# Patient Record
Sex: Female | Born: 1955 | Race: Black or African American | Hispanic: No | Marital: Single | Smoking: Former smoker
Health system: Southern US, Community
[De-identification: ages and names within clinical notes are randomized; demographics above are authoritative.]

## PROBLEM LIST (undated history)

## (undated) DIAGNOSIS — M5136 Other intervertebral disc degeneration, lumbar region: Secondary | ICD-10-CM

## (undated) DIAGNOSIS — L700 Acne vulgaris: Secondary | ICD-10-CM

## (undated) DIAGNOSIS — M109 Gout, unspecified: Secondary | ICD-10-CM

## (undated) DIAGNOSIS — G4730 Sleep apnea, unspecified: Secondary | ICD-10-CM

## (undated) DIAGNOSIS — E119 Type 2 diabetes mellitus without complications: Secondary | ICD-10-CM

## (undated) DIAGNOSIS — R42 Dizziness and giddiness: Secondary | ICD-10-CM

## (undated) DIAGNOSIS — M51369 Other intervertebral disc degeneration, lumbar region without mention of lumbar back pain or lower extremity pain: Secondary | ICD-10-CM

## (undated) DIAGNOSIS — M1990 Unspecified osteoarthritis, unspecified site: Secondary | ICD-10-CM

## (undated) DIAGNOSIS — H9190 Unspecified hearing loss, unspecified ear: Secondary | ICD-10-CM

## (undated) DIAGNOSIS — K635 Polyp of colon: Secondary | ICD-10-CM

## (undated) DIAGNOSIS — F09 Unspecified mental disorder due to known physiological condition: Secondary | ICD-10-CM

## (undated) DIAGNOSIS — F329 Major depressive disorder, single episode, unspecified: Secondary | ICD-10-CM

## (undated) DIAGNOSIS — K219 Gastro-esophageal reflux disease without esophagitis: Secondary | ICD-10-CM

## (undated) DIAGNOSIS — D649 Anemia, unspecified: Secondary | ICD-10-CM

## (undated) DIAGNOSIS — I10 Essential (primary) hypertension: Secondary | ICD-10-CM

## (undated) DIAGNOSIS — E785 Hyperlipidemia, unspecified: Secondary | ICD-10-CM

## (undated) DIAGNOSIS — L659 Nonscarring hair loss, unspecified: Secondary | ICD-10-CM

## (undated) DIAGNOSIS — F32A Depression, unspecified: Secondary | ICD-10-CM

## (undated) DIAGNOSIS — K317 Polyp of stomach and duodenum: Secondary | ICD-10-CM

## (undated) DIAGNOSIS — Q440 Agenesis, aplasia and hypoplasia of gallbladder: Secondary | ICD-10-CM

## (undated) DIAGNOSIS — M3500 Sicca syndrome, unspecified: Secondary | ICD-10-CM

## (undated) DIAGNOSIS — F419 Anxiety disorder, unspecified: Secondary | ICD-10-CM

## (undated) DIAGNOSIS — M5416 Radiculopathy, lumbar region: Secondary | ICD-10-CM

## (undated) DIAGNOSIS — H409 Unspecified glaucoma: Secondary | ICD-10-CM

## (undated) DIAGNOSIS — R519 Headache, unspecified: Secondary | ICD-10-CM

## (undated) HISTORY — DX: Gout, unspecified: M10.9

## (undated) HISTORY — DX: Polyp of colon: K63.5

## (undated) HISTORY — DX: Unspecified glaucoma: H40.9

## (undated) HISTORY — PX: TUBAL LIGATION: SHX77

## (undated) HISTORY — DX: Other intervertebral disc degeneration, lumbar region: M51.36

## (undated) HISTORY — PX: CHOLECYSTECTOMY: SHX55

## (undated) HISTORY — DX: Acne vulgaris: L70.0

## (undated) HISTORY — DX: Type 2 diabetes mellitus without complications: E11.9

## (undated) HISTORY — DX: Depression, unspecified: F32.A

## (undated) HISTORY — DX: Agenesis, aplasia and hypoplasia of gallbladder: Q44.0

## (undated) HISTORY — DX: Radiculopathy, lumbar region: M54.16

## (undated) HISTORY — DX: Other intervertebral disc degeneration, lumbar region without mention of lumbar back pain or lower extremity pain: M51.369

## (undated) HISTORY — DX: Anxiety disorder, unspecified: F41.9

## (undated) HISTORY — DX: Sjogren syndrome, unspecified: M35.00

## (undated) HISTORY — DX: Major depressive disorder, single episode, unspecified: F32.9

## (undated) HISTORY — DX: Sleep apnea, unspecified: G47.30

## (undated) HISTORY — DX: Unspecified osteoarthritis, unspecified site: M19.90

## (undated) HISTORY — DX: Gastro-esophageal reflux disease without esophagitis: K21.9

## (undated) HISTORY — DX: Anemia, unspecified: D64.9

## (undated) HISTORY — PX: BACK SURGERY: SHX140

---

## 2013-05-22 HISTORY — PX: COLONOSCOPY: SHX174

## 2017-01-27 ENCOUNTER — Emergency Department (HOSPITAL_COMMUNITY)
Admission: EM | Admit: 2017-01-27 | Discharge: 2017-01-27 | Disposition: A | Discharge status: Home / Self Care | Payer: Federal, State, Local not specified - PPO | Attending: Emergency Medicine | Admitting: Emergency Medicine

## 2017-01-27 ENCOUNTER — Encounter (HOSPITAL_COMMUNITY): Payer: Self-pay

## 2017-01-27 ENCOUNTER — Emergency Department (HOSPITAL_COMMUNITY): Payer: Federal, State, Local not specified - PPO

## 2017-01-27 DIAGNOSIS — Z87891 Personal history of nicotine dependence: Secondary | ICD-10-CM | POA: Diagnosis not present

## 2017-01-27 DIAGNOSIS — Z7984 Long term (current) use of oral hypoglycemic drugs: Secondary | ICD-10-CM | POA: Diagnosis not present

## 2017-01-27 DIAGNOSIS — R42 Dizziness and giddiness: Secondary | ICD-10-CM | POA: Diagnosis not present

## 2017-01-27 DIAGNOSIS — Z79899 Other long term (current) drug therapy: Secondary | ICD-10-CM | POA: Insufficient documentation

## 2017-01-27 DIAGNOSIS — G930 Cerebral cysts: Secondary | ICD-10-CM | POA: Diagnosis not present

## 2017-01-27 HISTORY — DX: Dizziness and giddiness: R42

## 2017-01-27 LAB — CBC WITH DIFFERENTIAL/PLATELET
Basophils Absolute: 0 10*3/uL (ref 0.0–0.1)
Basophils Relative: 1 %
Eosinophils Absolute: 0.2 10*3/uL (ref 0.0–0.7)
Eosinophils Relative: 4 %
HCT: 38.7 % (ref 36.0–46.0)
Hemoglobin: 12.5 g/dL (ref 12.0–15.0)
Lymphocytes Relative: 47 %
Lymphs Abs: 2.9 10*3/uL (ref 0.7–4.0)
MCH: 29.8 pg (ref 26.0–34.0)
MCHC: 32.3 g/dL (ref 30.0–36.0)
MCV: 92.1 fL (ref 78.0–100.0)
Monocytes Absolute: 0.5 10*3/uL (ref 0.1–1.0)
Monocytes Relative: 8 %
Neutro Abs: 2.5 10*3/uL (ref 1.7–7.7)
Neutrophils Relative %: 40 %
Platelets: 361 10*3/uL (ref 150–400)
RBC: 4.2 MIL/uL (ref 3.87–5.11)
RDW: 14.1 % (ref 11.5–15.5)
WBC: 6.1 10*3/uL (ref 4.0–10.5)

## 2017-01-27 LAB — COMPREHENSIVE METABOLIC PANEL
ALT: 22 U/L (ref 14–54)
AST: 21 U/L (ref 15–41)
Albumin: 3.9 g/dL (ref 3.5–5.0)
Alkaline Phosphatase: 65 U/L (ref 38–126)
Anion gap: 7 (ref 5–15)
BUN: 12 mg/dL (ref 6–20)
CO2: 27 mmol/L (ref 22–32)
Calcium: 9.1 mg/dL (ref 8.9–10.3)
Chloride: 104 mmol/L (ref 101–111)
Creatinine, Ser: 0.73 mg/dL (ref 0.44–1.00)
GFR calc Af Amer: >60 mL/min (ref >60)
GFR calc non Af Amer: >60 mL/min (ref >60)
Glucose, Bld: 104 mg/dL — ABNORMAL HIGH (ref 65–99)
Potassium: 4.3 mmol/L (ref 3.5–5.1)
Sodium: 138 mmol/L (ref 135–145)
Total Bilirubin: 0.7 mg/dL (ref 0.3–1.2)
Total Protein: 7.8 g/dL (ref 6.5–8.1)

## 2017-01-27 LAB — I-STAT TROPONIN, ED: Troponin i, poc: 0 ng/mL (ref 0.00–0.08)

## 2017-01-27 MED ORDER — DIPHENHYDRAMINE HCL 50 MG/ML IJ SOLN
25.0000 mg | Freq: Once | INTRAMUSCULAR | Status: AC
  Administered 2017-01-27: 25 mg via INTRAVENOUS
  Filled 2017-01-27: qty 1

## 2017-01-27 MED ORDER — METOCLOPRAMIDE HCL 5 MG/ML IJ SOLN
10.0000 mg | Freq: Once | INTRAMUSCULAR | Status: AC
  Administered 2017-01-27: 10 mg via INTRAVENOUS
  Filled 2017-01-27: qty 2

## 2017-01-27 MED ORDER — MECLIZINE HCL 25 MG PO TABS
25.0000 mg | ORAL_TABLET | Freq: Once | ORAL | Status: AC
  Administered 2017-01-27: 25 mg via ORAL
  Filled 2017-01-27: qty 1

## 2017-01-27 MED ORDER — LORAZEPAM 2 MG/ML IJ SOLN
1.0000 mg | Freq: Once | INTRAMUSCULAR | Status: AC
  Administered 2017-01-27: 1 mg via INTRAVENOUS
  Filled 2017-01-27: qty 1

## 2017-01-27 MED ORDER — GADOBENATE DIMEGLUMINE 529 MG/ML IV SOLN
20.0000 mL | Freq: Once | INTRAVENOUS | Status: AC
  Administered 2017-01-27: 20 mL via INTRAVENOUS

## 2017-01-27 MED ORDER — SODIUM CHLORIDE 0.9 % IV BOLUS (SEPSIS)
1000.0000 mL | Freq: Once | INTRAVENOUS | Status: AC
  Administered 2017-01-27: 1000 mL via INTRAVENOUS

## 2017-01-27 NOTE — ED Provider Notes (Signed)
[facility]-EMERGENCY DEPT Provider Note   CSN: [id number] Arrival date & time: 01/27/17  4098     History   Chief Complaint Chief Complaint  Patient presents with  . Dizziness    HPI Rebecca Hahn is a 61 y.o. female history of labyrinthitis, known peripheral vertigo, here presenting with worsening vertigo. Patient states that she just moved here from [place] to work. She has previous history of vertigo and has seen ENT doctor before and is on meclizine and Klonopin at baseline. Patient states that she has been driving very often and had worsening dizziness since yesterday. She took meclizine 3 times yesterday with minimal relief. She just constantly feels that the room was spinning. Denies falling over or neck pain or weakness or numbness. Patient states that she has no history of strokes or mini strokes in the past.   The history is provided by the patient.    Past Medical History:  Diagnosis Date  . Vertigo     There are no active problems to display for this patient.   History reviewed. No pertinent surgical history.  OB History    No data available       Home Medications    Prior to Admission medications   Medication Sig Start Date End Date Taking? Authorizing Provider  allopurinol (ZYLOPRIM) 100 MG tablet Take 100 mg by mouth 2 (two) times daily. 12/31/16  Yes [provider]  clonazePAM (KLONOPIN) 0.5 MG tablet Take 0.5 mg by mouth 2 (two) times daily as needed for anxiety. 01/12/17  Yes [provider]  Diclofenac Sodium CR 100 MG 24 hr tablet Take 100 mg by mouth daily. 12/20/16  Yes [provider]  gabapentin (NEURONTIN) 100 MG capsule Take 100 mg by mouth 3 (three) times daily as needed. 01/01/17  Yes [provider]  metFORMIN (GLUCOPHAGE) 1000 MG tablet Take 1,000 mg by mouth See admin instructions. Take  1 to 2 times daily   Yes [provider]  pilocarpine (SALAGEN) 5 MG tablet Take 5 mg by mouth daily. 12/01/16  Yes  [provider]  sertraline (ZOLOFT) 100 MG tablet Take 100 mg by mouth daily. 12/31/16  Yes [provider]  traZODone (DESYREL) 50 MG tablet Take 50-100 mg by mouth at bedtime.   Yes [provider]  hydroxychloroquine (PLAQUENIL) 200 MG tablet Take 200 mg by mouth daily. 01/01/17   [provider]    Family History History reviewed. No pertinent family history.  Social History Social History  Substance Use Topics  . Smoking status: Former Games developer  . Smokeless tobacco: Never Used  . Alcohol use No     Allergies   Penicillins   Review of Systems Review of Systems  Neurological: Positive for dizziness.  All other systems reviewed and are negative.    Physical Exam Updated Vital Signs BP (!) 157/82 (BP Location: Right Arm)   Pulse 78   Temp 97.8 F (36.6 C) (Oral)   Resp 18   Ht  (1.702 m)   Wt 113.4 kg (250 lb)   SpO2 98%   BMI 39.16 kg/m   Physical Exam  Constitutional: She is oriented to person, place, and time.  Uncomfortable, eyes closed   HENT:  Head: Normocephalic.  Mouth/Throat: Oropharynx is clear and moist.  Eyes:  Mild L horizontal nystagmus, not changing with direction   Neck: Normal range of motion. Neck supple.  Cardiovascular: Normal rate, regular rhythm and normal heart sounds.   Pulmonary/Chest: Effort normal and  breath sounds normal. No respiratory distress. She has no wheezes.  Abdominal: Soft. Bowel sounds are normal. She exhibits no distension. There is no tenderness.  Musculoskeletal: Normal range of motion.  Neurological: She is alert and oriented to person, place, and time. No cranial nerve deficit. Coordination normal.  CN 2-12 intact, nl finger to nose. Nl strength throughout. Nl gait   Skin: Skin is warm.  Psychiatric: She has a normal mood and affect.  Nursing note and vitals reviewed.    ED Treatments / Results  Labs (all labs ordered are listed, but only abnormal results are  displayed) Labs Reviewed  COMPREHENSIVE METABOLIC PANEL - Abnormal; Notable for the following:       Result Value   Glucose, Bld 104 (*)    All other components within normal limits  CBC WITH DIFFERENTIAL/PLATELET  I-STAT TROPONIN, ED    EKG  EKG Interpretation  Date/Time:  Saturday January 27 2017 08:45:56 EDT Ventricular Rate:  79 PR Interval:    QRS Duration: 72 QT Interval:  388 QTC Calculation: 445 R Axis:   35 Text Interpretation:  Sinus rhythm Borderline T abnormalities, anterior leads Baseline wander No old tracing to compare Confirmed by Pricilla LovelessGoldston, Scott [phone]([id number]) on 01/27/2017 8:50:46 AM       Radiology Ct Head Wo Contrast  Result Date: 01/27/2017 CLINICAL DATA:  Dizziness, weakness. EXAM: CT HEAD WITHOUT CONTRAST TECHNIQUE: Contiguous axial images were obtained from the base of the skull through the vertex without intravenous contrast. COMPARISON:  None. FINDINGS: Brain: Left temporal subcortical white matter low density is noted concerning for infection, infarction or edema. No midline shift is noted. No hemorrhage is noted. Vascular: No hyperdense vessel or unexpected calcification. Skull: Normal. Negative for fracture or focal lesion. Sinuses/Orbits: No acute finding. Other: None. IMPRESSION: Left temporal subcortical white matter low density is noted concerning for inflammation, infarction or edema. MRI is recommended for further evaluation. Electronically Signed   By: Lupita RaiderJames  Green Jr, M.D.   On: 01/27/2017 09:55    Procedures Procedures (including critical care time)  Medications Ordered in ED Medications  sodium chloride 0.9 % bolus 1,000 mL (1,000 mLs Intravenous New Bag/Given 01/27/17 0917)  meclizine (ANTIVERT) tablet 25 mg (25 mg Oral Given 01/27/17 0927)  metoCLOPramide (REGLAN) injection 10 mg (10 mg Intravenous Given 01/27/17 0927)  diphenhydrAMINE (BENADRYL) injection 25 mg (25 mg Intravenous Given 01/27/17 0928)  LORazepam (ATIVAN) injection 1 mg (1 mg  Intravenous Given 01/27/17 [id number])     Initial Impression / Assessment and Plan / ED Course  I have reviewed the triage vital signs and the nursing notes.  Pertinent labs & imaging results that were available during my care of the patient were reviewed by me and considered in my medical decision making (see chart for details).    Bronson IngYvette Lalani is a 61 y.o. female here with dizziness, vertigo. Hx of labyrinthitis and peripheral vertigo and this is a recurrent issue. No hx of posterior stroke. Will get labs, give meclizine, ativan and reassess.    10:40 AM Patient's labs unremarkable. Orthostatics unremarkable. CT head showed possible L temporal lobe inflammation vs edema. I called Dr. Laurence SlateAroor from neurology. He recommend MRI brain w/wo to r/o stroke vs MS vs cyst. There is no MRI at [facility] this weekend. [facility]. If MRI showed stroke then will need official consult with neurology. Otherwise, patient has meclizine and klonopin at home and per neurology, patient can be discharged if MRI unremarkable.  Dr. Juleen China accepting in the ED.   Final Clinical Impressions(s) / ED Diagnoses   Final diagnoses:  None    New Prescriptions New Prescriptions   No medications on file     Charlynne Pander, MD 01/27/17 1042

## 2017-01-27 NOTE — ED Triage Notes (Signed)
Pt with dizziness and weakness since yesterday.  Feels "heavy".  No fever.  No pain.  No chest pain

## 2017-01-27 NOTE — Discharge Instructions (Signed)
Return here as needed.  Follow-up with the neurologist provided °

## 2017-01-27 NOTE — ED Notes (Signed)
Pt st's dizziness has subsided at this time.  Pt waiting for MRI

## 2017-01-27 NOTE — ED Provider Notes (Signed)
12:48 PM atient seen and examined. Patient is a transfer from [facility] emergency department. Patient was seen for vertigo-like symptoms. Had CT scan that showedleft temporal subcortical white matter low density, concerning for inflammation, infarction, or edema. MRI was recommended next. Patient's was transferred here for MRI.  Patient reports history of vertigo-like symptoms since 1997. She states she is from [place] and has had to have multiple ER visits and admissions for her vertigo. She is currently taking meclizine which has not helped this time. She states that she normally gets vertigo whenever she has a sinus infection.She reports current nasal drainage, but states it is clear. Reports some pressure over her sinuses.  Patient received meclizine, Reglan, Benadryl, Ativan [facility] as well as fluids. She is feeling much better. Her labs were normal. We'll wait for MRI. We'll monitor.  Vitals:   01/27/17 0829 01/27/17 0833 01/27/17 1157 01/27/17 1237  BP:  (!) 157/82 123/82 (!) 120/50  Pulse:  78 77 79  Resp:  18 (!) 22 18  Temp:  97.8 F (36.6 C) 97.9 F (36.6 C) 98.8 F (37.1 C)  TempSrc:  Oral Oral Oral  SpO2:  98% 97% 98%  Weight: 113.4 kg (250 lb)     Height: 5\' 7"  (1.702 m)         Jaynie CrumbleKirichenko, Maxxon Schwanke, PA-C 01/28/17 [id number]    Vanetta MuldersZackowski, Scott, MD 01/31/17 2218

## 2017-01-27 NOTE — ED Notes (Signed)
Patient is resting with family at bedside with call bell in reach 

## 2017-01-30 DIAGNOSIS — R42 Dizziness and giddiness: Secondary | ICD-10-CM | POA: Diagnosis not present

## 2017-01-30 DIAGNOSIS — M150 Primary generalized (osteo)arthritis: Secondary | ICD-10-CM | POA: Diagnosis not present

## 2017-01-30 DIAGNOSIS — E1165 Type 2 diabetes mellitus with hyperglycemia: Secondary | ICD-10-CM | POA: Diagnosis not present

## 2017-01-30 DIAGNOSIS — M3501 Sicca syndrome with keratoconjunctivitis: Secondary | ICD-10-CM | POA: Diagnosis not present

## 2017-01-31 DIAGNOSIS — M79643 Pain in unspecified hand: Secondary | ICD-10-CM | POA: Diagnosis not present

## 2017-01-31 DIAGNOSIS — M79641 Pain in right hand: Secondary | ICD-10-CM | POA: Diagnosis not present

## 2017-01-31 DIAGNOSIS — M79642 Pain in left hand: Secondary | ICD-10-CM | POA: Diagnosis not present

## 2017-01-31 DIAGNOSIS — M180 Bilateral primary osteoarthritis of first carpometacarpal joints: Secondary | ICD-10-CM | POA: Diagnosis not present

## 2017-01-31 DIAGNOSIS — M1990 Unspecified osteoarthritis, unspecified site: Secondary | ICD-10-CM | POA: Diagnosis not present

## 2017-01-31 DIAGNOSIS — M3500 Sicca syndrome, unspecified: Secondary | ICD-10-CM | POA: Diagnosis not present

## 2017-01-31 DIAGNOSIS — M549 Dorsalgia, unspecified: Secondary | ICD-10-CM | POA: Diagnosis not present

## 2017-02-08 ENCOUNTER — Encounter: Payer: Self-pay | Admitting: Neurology

## 2017-02-08 ENCOUNTER — Ambulatory Visit (INDEPENDENT_AMBULATORY_CARE_PROVIDER_SITE_OTHER): Payer: Federal, State, Local not specified - PPO | Admitting: Neurology

## 2017-02-08 VITALS — BP 144/93 | HR 89 | Ht 67.0 in | Wt 262.0 lb

## 2017-02-08 DIAGNOSIS — Z6841 Body Mass Index (BMI) 40.0 and over, adult: Secondary | ICD-10-CM

## 2017-02-08 DIAGNOSIS — R42 Dizziness and giddiness: Secondary | ICD-10-CM | POA: Diagnosis not present

## 2017-02-08 DIAGNOSIS — G930 Cerebral cysts: Secondary | ICD-10-CM | POA: Diagnosis not present

## 2017-02-08 DIAGNOSIS — G4733 Obstructive sleep apnea (adult) (pediatric): Secondary | ICD-10-CM | POA: Diagnosis not present

## 2017-02-08 DIAGNOSIS — R519 Headache, unspecified: Secondary | ICD-10-CM

## 2017-02-08 DIAGNOSIS — R51 Headache: Secondary | ICD-10-CM | POA: Diagnosis not present

## 2017-02-08 NOTE — Patient Instructions (Signed)
Your neurological exam is good today, please try to stay well hydrated with water.  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  Please talk to your primary care about seeing an ENT.  We will do another sleep study to recheck on your sleep apnea.

## 2017-02-08 NOTE — Progress Notes (Signed)
Subjective:    Patient ID: Nai Borromeo is a 61 y.o. female.  HPI     Huston Foley, MD, PhD [facility] [address], [address] [address] [place], [place] [address]  I saw patient Mikiya Nebergall as a referral from the emergency room for dizziness. The patient is accompanied by her youngest daughter today. She is a 39 year old right-handed woman with an underlying medical history of recurrent headaches, Sjogren's syndrome, gout, arthritis, chronic low back pain, and morbid obesity with a BMI of over 40, who has a prior history of dizziness and vertigo, for which she was followed by a ENT in [place] before she moved here. She presented to the emergency room on 01/27/2017 with complaint of dizziness which lasted since the day prior. She described a room spinning sensation. She denied any other focal neurologic complaints. She was treated in the past with meclizine and clonazepam. In the emergency room she was treated symptomatically with IV fluids, Benadryl, Reglan, Ativan and meclizine.   She had a brain MRI with and without contrast on 01/27/2017 which I reviewed: IMPRESSION: 1. 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 Additional considerations include a neuroglial or other benign cyst or less likely cystic neoplasm. Comparison with any prior outside studies may be helpful to establish stability. Recommend follow-up contrast-enhanced brain MRI in 6 months with thin section T1 and T2 (CISS/FIESTA) imaging through the temporal lobes. 2. 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 3. No acute infarct.  Prior to that she had a head CT without contrast on 01/27/2017 which I reviewed:    IMPRESSION: Left temporal subcortical white matter low density is noted concerning for inflammation, infarction or edema. MRI is recommended for further evaluation. She is not sure, that meclizine is helpful. She walks more cautiously, she fell in the bathroom, but tripped over a chair at night, on the way to bathroom.  She moved from [place] a month ago, she has 4 daughters, lives with the youngest. She works at the [employer] in [place], in med records. She had a sleep study in May 2015 which indicated mild sleep apnea, no significant PLMS. She had moderate sleep apnea during REM sleep. Epworth Sleepiness Scale score is 18 out of 24, fatigue score is 57 out of 63. She is followed by rheumatology for her Sjogren's. She had some medication changes recently. She has woken up with a headache. She denies any one-sided weakness or numbness or tingling or droopy face or slurring of speech but sometimes as the day goes on her speech is more slow. She reports that she never had an MRI before.  Her Past Medical History Is Significant For: Past Medical History:  Diagnosis Date  . Vertigo     Her Past Surgical History Is Significant For: No past surgical history on file.  Her Family History Is Significant For: No family history on file.  Her Social History Is Significant For: Social History   Social History  . Marital status: Single    Spouse name: N/A  . Number of children: N/A  . Years of education: N/A   Social History Main Topics  . Smoking status: Former Games developer  . Smokeless tobacco: Never Used  . Alcohol use No  . Drug use: No  . Sexual  activity: Not Asked   Other Topics Concern  . None   Social History Narrative  . None    Her Allergies Are:  Allergies  Allergen Reactions  . Penicillins Rash    Has patient had a PCN reaction causing immediate rash, facial/tongue/throat swelling, SOB or lightheadedness with hypotension: No Has patient had a PCN reaction causing severe  rash involving mucus membranes or skin necrosis: No Has patient had a PCN reaction that required hospitalization: No Has patient had a PCN reaction occurring within the last 10 years: No If all of the above answers are "NO", then may proceed with Cephalosporin use.    :   Her Current Medications Are:  Outpatient Encounter Prescriptions as of 02/08/2017  Medication Sig  . allopurinol (ZYLOPRIM) 100 MG tablet Take 200 mg by mouth daily.   Marland Kitchen aspirin 81 MG chewable tablet Chew 81 mg by mouth daily.  . B Complex-C (SUPER B COMPLEX PO) Take by mouth.  . calcium-vitamin D (OSCAL WITH D) 250-125 MG-UNIT tablet Take 1 tablet by mouth daily.  . Cholecalciferol (VITAMIN D-3) 5000 units TABS Take by mouth.  . clonazePAM (KLONOPIN) 0.5 MG tablet Take 0.5 mg by mouth at bedtime.   . Cyanocobalamin (VITAMIN B-12 SL) Place under the tongue.  . cycloSPORINE (RESTASIS) 0.05 % ophthalmic emulsion 1 drop 2 (two) times daily.  . Diclofenac Sodium CR 100 MG 24 hr tablet Take 100 mg by mouth daily.  Marland Kitchen gabapentin (NEURONTIN) 100 MG capsule Take 100 mg by mouth 3 (three) times daily as needed.  . hydroxychloroquine (PLAQUENIL) 200 MG tablet Take 200 mg by mouth daily.  . IRON PO Take by mouth.  . meclizine (ANTIVERT) 12.5 MG tablet Take 12.5 mg by mouth 3 (three) times daily as needed for dizziness.  . metFORMIN (GLUCOPHAGE) 500 MG tablet Take 500 mg by mouth daily with breakfast.  . pilocarpine (SALAGEN) 5 MG tablet Take 5 mg by mouth 3 (three) times daily as needed.   . rosuvastatin (CRESTOR) 20 MG tablet Take 20 mg by mouth daily.  . sertraline (ZOLOFT) 50 MG tablet Take 75 mg by mouth daily.  . traZODone (DESYREL) 50 MG tablet Take 50-100 mg by mouth at bedtime.  Marland Kitchen VITAMIN B1-B12 PO Take by mouth.  . [DISCONTINUED] metFORMIN (GLUCOPHAGE) 1000 MG tablet Take 1,000 mg by mouth See admin instructions. Take  1 to 2 times daily  . [DISCONTINUED] sertraline (ZOLOFT) 100 MG tablet Take 100 mg by mouth daily.    No facility-administered encounter medications on file as of 02/08/2017.   :  Review of Systems:  Out of a complete 14 point review of systems, all are reviewed and negative with the exception of these symptoms as listed below: Review of Systems  Neurological:       Pt presents today to discuss her dizziness and brain cyst. Pt does not have a neurosurgeon. Pt is complaining of dizziness for which meclizine has not helped.    Objective:  Neurological Exam  Physical Exam Physical Examination:   Vitals:   02/08/17 1412  BP: (!) 144/93  Pulse: 89   General Examination: The patient is a very pleasant 61 y.o. female in no acute distress. She appears well-developed and well-nourished and well groomed. She currently does not have any vertiginous symptoms. Orthostatic vital signs were benign.  HEENT: Normocephalic, atraumatic, pupils are equal, round and reactive to light and accommodation. Extraocular tracking is good without limitation to gaze excursion or nystagmus noted. Normal smooth pursuit is  noted. Hearing is grossly intact. Face is symmetric with normal facial animation and normal facial sensation. Speech is clear with no dysarthria noted. There is no hypophonia. There is no lip, neck/head, jaw or voice tremor. Neck is supple with full range of passive and active motion. There are no carotid bruits on auscultation. Oropharynx exam reveals: moderate mouth dryness, adequate dental hygiene and moderate airway crowding secondary to smaller airway, tonsils are 1-2+ and longer uvula. Mallampati is class II. Tongue protrudes centrally and palate elevates symmetrically.  neck circumference is 16-1/2 inches.   Chest: Clear to auscultation without wheezing, rhonchi or crackles noted.  Heart: S1+S2+0, regular and normal without murmurs, rubs or gallops noted.   Abdomen: Soft, non-tender and non-distended with normal bowel sounds appreciated on auscultation.  Extremities: There is no pitting  edema in the distal lower extremities bilaterally. Pedal pulses are intact.  Skin: Warm and dry without trophic changes noted. There are no varicose veins.  Musculoskeletal: exam reveals no obvious joint deformities, tenderness or joint swelling or erythema.   Neurologically:  Mental status: The patient is awake, alert and oriented in all 4 spheres. Her immediate and remote memory, attention, language skills and fund of knowledge are appropriate. There is no evidence of aphasia, agnosia, apraxia or anomia. Speech is clear with normal prosody and enunciation. Thought process is linear. Mood is normal and affect is normal.  Cranial nerves II - XII are as described above under HEENT exam. In addition: shoulder shrug is normal with equal shoulder height noted. Motor exam: Normal bulk, strength and tone is noted. There is no drift, tremor or rebound. Romberg is negative. Reflexes are 1+ in the upper extremities, trace in the lower extremities. Babinski: Toes are flexor bilaterally. Fine motor skills and coordination: intact with normal finger taps, normal hand movements, normal rapid alternating patting, normal foot taps and normal foot agility.  Cerebellar testing: No dysmetria or intention tremor on finger to nose testing. Heel to shin is unremarkable bilaterally. There is no truncal or gait ataxia.  Sensory exam: intact to light touch, pinprick, vibration, temperature sense in the upper and lower extremities.  Gait, station and balance: She stands easily. No veering to one side is noted. No leaning to one side is noted. Posture is age-appropriate and stance is narrow based. Gait shows normal stride length and normal pace. No problems turning are noted.  she has difficulty with tandem walk.               Assessment and Plan:  In summary, Kamla Skilton is a very pleasant 61 y.o.-year old female with an underlying medical history of recurrent headaches, Sjogren's syndrome, gout, arthritis, chronic low back  pain, and morbid obesity with a BMI of over 40, who presents for neurologic consultation referred from the emergency room for dizziness. She has a nonfocal neurological exam today. She has a prior history of vertigo for which she is encouraged to seek consultation with ENT. 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 We will refer her to neurosurgery for this as it may be asymptomatic at this time and may just need to be followed. I could not explain the relevance of the cyst to her at this point, she is advised that she should see a brain surgery specialist for this. I made a referral in that regard. She reports morning headaches and daytime somnolence and a prior diagnosis of OSA. She is encouraged to pursue a sleep study  testing for this. She is agreeable. I made a referral to a sleep study for this. She would be agreeable to trying CPAP. Sometimes dizziness is a nonspecific complaint and improves with treatment of sleep apnea. Thankfully, her neurological exam is nonfocal, she does not have telltale vertigo today. She did have a nonspecific difficulty with tandem walk today and otherwise her exam is normal. I suggested follow-up after the sleep study is completed. She is advised to discuss with primary care physician the referral to ENT. She reports a history of sinus issues for a long time. I answered all their questions today and the patient and her daughter were in agreement.   Huston Foley, MD, PhD

## 2017-02-19 DIAGNOSIS — M47896 Other spondylosis, lumbar region: Secondary | ICD-10-CM | POA: Diagnosis not present

## 2017-02-19 DIAGNOSIS — M62838 Other muscle spasm: Secondary | ICD-10-CM | POA: Diagnosis not present

## 2017-02-19 DIAGNOSIS — M5116 Intervertebral disc disorders with radiculopathy, lumbar region: Secondary | ICD-10-CM | POA: Diagnosis not present

## 2017-02-19 DIAGNOSIS — M9903 Segmental and somatic dysfunction of lumbar region: Secondary | ICD-10-CM | POA: Diagnosis not present

## 2017-02-21 DIAGNOSIS — M9903 Segmental and somatic dysfunction of lumbar region: Secondary | ICD-10-CM | POA: Diagnosis not present

## 2017-02-21 DIAGNOSIS — M62838 Other muscle spasm: Secondary | ICD-10-CM | POA: Diagnosis not present

## 2017-02-21 DIAGNOSIS — M5116 Intervertebral disc disorders with radiculopathy, lumbar region: Secondary | ICD-10-CM | POA: Diagnosis not present

## 2017-02-21 DIAGNOSIS — M47896 Other spondylosis, lumbar region: Secondary | ICD-10-CM | POA: Diagnosis not present

## 2017-02-26 DIAGNOSIS — Q018 Encephalocele of other sites: Secondary | ICD-10-CM | POA: Diagnosis not present

## 2017-02-26 DIAGNOSIS — R42 Dizziness and giddiness: Secondary | ICD-10-CM | POA: Diagnosis not present

## 2017-02-26 DIAGNOSIS — Z6841 Body Mass Index (BMI) 40.0 and over, adult: Secondary | ICD-10-CM | POA: Diagnosis not present

## 2017-02-26 DIAGNOSIS — R030 Elevated blood-pressure reading, without diagnosis of hypertension: Secondary | ICD-10-CM | POA: Diagnosis not present

## 2017-02-26 DIAGNOSIS — E1165 Type 2 diabetes mellitus with hyperglycemia: Secondary | ICD-10-CM | POA: Diagnosis not present

## 2017-02-28 DIAGNOSIS — M9903 Segmental and somatic dysfunction of lumbar region: Secondary | ICD-10-CM | POA: Diagnosis not present

## 2017-02-28 DIAGNOSIS — M47896 Other spondylosis, lumbar region: Secondary | ICD-10-CM | POA: Diagnosis not present

## 2017-02-28 DIAGNOSIS — M5116 Intervertebral disc disorders with radiculopathy, lumbar region: Secondary | ICD-10-CM | POA: Diagnosis not present

## 2017-02-28 DIAGNOSIS — M62838 Other muscle spasm: Secondary | ICD-10-CM | POA: Diagnosis not present

## 2017-03-06 DIAGNOSIS — M150 Primary generalized (osteo)arthritis: Secondary | ICD-10-CM | POA: Diagnosis not present

## 2017-03-06 DIAGNOSIS — R42 Dizziness and giddiness: Secondary | ICD-10-CM | POA: Diagnosis not present

## 2017-03-06 DIAGNOSIS — E1165 Type 2 diabetes mellitus with hyperglycemia: Secondary | ICD-10-CM | POA: Diagnosis not present

## 2017-03-13 ENCOUNTER — Ambulatory Visit: Payer: Federal, State, Local not specified - PPO | Admitting: Neurology

## 2017-03-23 DIAGNOSIS — M5116 Intervertebral disc disorders with radiculopathy, lumbar region: Secondary | ICD-10-CM | POA: Diagnosis not present

## 2017-03-23 DIAGNOSIS — M62838 Other muscle spasm: Secondary | ICD-10-CM | POA: Diagnosis not present

## 2017-03-23 DIAGNOSIS — M47896 Other spondylosis, lumbar region: Secondary | ICD-10-CM | POA: Diagnosis not present

## 2017-03-23 DIAGNOSIS — M9903 Segmental and somatic dysfunction of lumbar region: Secondary | ICD-10-CM | POA: Diagnosis not present

## 2017-04-20 DIAGNOSIS — M5116 Intervertebral disc disorders with radiculopathy, lumbar region: Secondary | ICD-10-CM | POA: Diagnosis not present

## 2017-04-20 DIAGNOSIS — M9903 Segmental and somatic dysfunction of lumbar region: Secondary | ICD-10-CM | POA: Diagnosis not present

## 2017-04-20 DIAGNOSIS — M47896 Other spondylosis, lumbar region: Secondary | ICD-10-CM | POA: Diagnosis not present

## 2017-04-20 DIAGNOSIS — M62838 Other muscle spasm: Secondary | ICD-10-CM | POA: Diagnosis not present

## 2017-05-08 DIAGNOSIS — J22 Unspecified acute lower respiratory infection: Secondary | ICD-10-CM | POA: Diagnosis not present

## 2017-05-18 ENCOUNTER — Other Ambulatory Visit: Payer: Self-pay

## 2017-05-18 ENCOUNTER — Encounter: Payer: Self-pay | Admitting: Emergency Medicine

## 2017-05-18 ENCOUNTER — Emergency Department (INDEPENDENT_AMBULATORY_CARE_PROVIDER_SITE_OTHER)
Admission: EM | Admit: 2017-05-18 | Discharge: 2017-05-18 | Disposition: A | Discharge status: Home / Self Care | Payer: Federal, State, Local not specified - PPO | Source: Home / Self Care | Attending: Family Medicine | Admitting: Family Medicine

## 2017-05-18 ENCOUNTER — Emergency Department (INDEPENDENT_AMBULATORY_CARE_PROVIDER_SITE_OTHER): Payer: Federal, State, Local not specified - PPO

## 2017-05-18 DIAGNOSIS — J208 Acute bronchitis due to other specified organisms: Secondary | ICD-10-CM

## 2017-05-18 DIAGNOSIS — J209 Acute bronchitis, unspecified: Secondary | ICD-10-CM

## 2017-05-18 DIAGNOSIS — R0789 Other chest pain: Secondary | ICD-10-CM

## 2017-05-18 MED ORDER — DOXYCYCLINE HYCLATE 100 MG PO CAPS: 100.0000 mg | ORAL_CAPSULE | Freq: Two times a day (BID) | ORAL | 0 refills | Status: DC

## 2017-05-18 MED ORDER — ALBUTEROL SULFATE HFA 108 (90 BASE) MCG/ACT IN AERS: 1.0000 | INHALATION_SPRAY | Freq: Four times a day (QID) | RESPIRATORY_TRACT | 0 refills | Status: AC | PRN

## 2017-05-18 MED ORDER — PREDNISONE 20 MG PO TABS: ORAL_TABLET | ORAL | 0 refills | Status: DC

## 2017-05-18 NOTE — ED Provider Notes (Signed)
[facility]    CSN: [id number] Arrival date & time: 05/18/17  1334     History   Chief Complaint Chief Complaint  Patient presents with  . Cough    HPI Rebecca Hahn is a 61 y.o. female.   HPI  Rebecca Hahn is a 61 y.o. female presenting to UC with c/o gradually worsening chest tightness with cough and mild congestion.  She was seen by her PCP last week and completed a course of Azithromycin and prednisone. Symptoms resolved for about 2 days but started to worsen again.  Her PCP was closed today but on-call nurse recommended she be evaluated at [facility] for a CXR. Denies fever, chills, n/v/d. No hx of asthma or COPD.   Past Medical History:  Diagnosis Date  . Vertigo     There are no active problems to display for this patient.   History reviewed. No pertinent surgical history.  OB History    No data available       Home Medications    Prior to Admission medications   Medication Sig Start Date End Date Taking? Authorizing Provider  albuterol (PROVENTIL HFA;VENTOLIN HFA) 108 (90 Base) MCG/ACT inhaler Inhale 1-2 puffs into the lungs every 6 (six) hours as needed for wheezing or shortness of breath. 05/18/17   Lurene Shadow, PA-C  allopurinol (ZYLOPRIM) 100 MG tablet Take 200 mg by mouth daily.  12/31/16   [provider]  aspirin 81 MG chewable tablet Chew 81 mg by mouth daily.    [provider]  B Complex-C (SUPER B COMPLEX PO) Take by mouth.    [provider]  calcium-vitamin D (OSCAL WITH D) 250-125 MG-UNIT tablet Take 1 tablet by mouth daily.    [provider]  Cholecalciferol (VITAMIN D-3) 5000 units TABS Take by mouth.    [provider]  clonazePAM (KLONOPIN) 0.5 MG tablet Take 0.5 mg by mouth at bedtime.  01/12/17   [provider]  Cyanocobalamin (VITAMIN B-12 SL) Place under the tongue.    [provider]  cycloSPORINE (RESTASIS) 0.05 % ophthalmic emulsion 1 drop 2 (two) times daily.     [provider]  Diclofenac Sodium CR 100 MG 24 hr tablet Take 100 mg by mouth daily. 12/20/16   [provider]  doxycycline (VIBRAMYCIN) 100 MG capsule Take 1 capsule (100 mg total) by mouth 2 (two) times daily. One po bid x 7 days 05/18/17   Lurene Shadow, PA-C  gabapentin (NEURONTIN) 100 MG capsule Take 100 mg by mouth 3 (three) times daily as needed. 01/01/17   [provider]  hydroxychloroquine (PLAQUENIL) 200 MG tablet Take 200 mg by mouth daily. 01/01/17   [provider]  IRON PO Take by mouth.    [provider]  meclizine (ANTIVERT) 12.5 MG tablet Take 12.5 mg by mouth 3 (three) times daily as needed for dizziness.    [provider]  metFORMIN (GLUCOPHAGE) 500 MG tablet Take 500 mg by mouth daily with breakfast.    [provider]  pilocarpine (SALAGEN) 5 MG tablet Take 5 mg by mouth 3 (three) times daily as needed.  12/01/16   [provider]  predniSONE (DELTASONE) 20 MG tablet 3 tabs po day one, then 2 po daily x 4 days 05/18/17   Lurene Shadow, PA-C  rosuvastatin (CRESTOR) 20 MG tablet Take 20 mg by mouth daily.    [provider]  sertraline (ZOLOFT) 50 MG tablet Take 75 mg by  mouth daily.    [provider]  traZODone (DESYREL) 50 MG tablet Take 50-100 mg by mouth at bedtime.    [provider]  VITAMIN B1-B12 PO Take by mouth.    [provider]    Family History No family history on file.  Social History Social History   Tobacco Use  . Smoking status: Former Games developermoker  . Smokeless tobacco: Never Used  Substance Use Topics  . Alcohol use: No  . Drug use: No     Allergies   Penicillins   Review of Systems Review of Systems  Constitutional: Negative for chills and fever.  HENT: Positive for congestion. Negative for ear pain, sore throat, trouble swallowing and voice change.   Respiratory: Positive for cough. Negative for shortness of breath.     Cardiovascular: Negative for chest pain and palpitations.  Gastrointestinal: Negative for abdominal pain, diarrhea, nausea and vomiting.  Musculoskeletal: Negative for arthralgias, back pain and myalgias.  Skin: Negative for rash.     Physical Exam Triage Vital Signs ED Triage Vitals  Enc Vitals Group     BP 05/18/17 1400 133/75     Pulse Rate 05/18/17 1400 84     Resp --      Temp 05/18/17 1400 98.5 F (36.9 C)     Temp Source 05/18/17 1400 Oral     SpO2 05/18/17 1400 95 %     Weight 05/18/17 1400 276 lb (125.2 kg)     Height 05/18/17 1400 5\' 8"  (1.727 m)     Head Circumference --      Peak Flow --      Pain Score 05/18/17 1401 0     Pain Loc --      Pain Edu? --      Excl. in GC? --    No data found.  Updated Vital Signs BP 133/75 (BP Location: Right Arm)   Pulse 84   Temp 98.5 F (36.9 C) (Oral)   Ht 5\' 8"  (1.727 m)   Wt 276 lb (125.2 kg)   SpO2 95%   BMI 41.97 kg/m   Visual Acuity Right Eye Distance:   Left Eye Distance:   Bilateral Distance:    Right Eye Near:   Left Eye Near:    Bilateral Near:     Physical Exam  Constitutional: She is oriented to person, place, and time. She appears well-developed and well-nourished. No distress.  HENT:  Head: Normocephalic and atraumatic.  Right Ear: Tympanic membrane normal.  Left Ear: Tympanic membrane normal.  Nose: Nose normal. Right sinus exhibits no maxillary sinus tenderness and no frontal sinus tenderness. Left sinus exhibits no maxillary sinus tenderness and no frontal sinus tenderness.  Mouth/Throat: Uvula is midline, oropharynx is clear and moist and mucous membranes are normal.  Eyes: EOM are normal.  Neck: Normal range of motion. Neck supple.  Cardiovascular: Normal rate and regular rhythm.  Pulmonary/Chest: Effort normal and breath sounds normal. No stridor. No respiratory distress. She has no wheezes. She has no rales.  Musculoskeletal: Normal range of motion.  Lymphadenopathy:    She has no  cervical adenopathy.  Neurological: She is alert and oriented to person, place, and time.  Skin: Skin is warm and dry. She is not diaphoretic.  Psychiatric: She has a normal mood and affect. Her behavior is normal.  Nursing note and vitals reviewed.    UC Treatments / Results  Labs (all labs ordered are listed, but only abnormal results are displayed) Labs Reviewed -  No data to display  EKG  EKG Interpretation None       Radiology Dg Chest 2 View  Result Date: 05/18/2017 CLINICAL DATA:  Acute bronchitis, cough for 2 weeks not getting better EXAM: CHEST  2 VIEW COMPARISON:  None FINDINGS: Normal heart size, mediastinal contours, and pulmonary vascularity. Mild peribronchial thickening. No pulmonary infiltrate, pleural effusion, or pneumothorax. Bones unremarkable. IMPRESSION: Mild bronchitic changes without infiltrate. Electronically Signed   By: Ulyses SouthwardMark  Boles M.D.   On: 05/18/2017 14:20    Procedures Procedures (including critical care time)  Medications Ordered in UC Medications - No data to display   Initial Impression / Assessment and Plan / UC Course  I have reviewed the triage vital signs and the nursing notes.  Pertinent labs & imaging results that were available during my care of the patient were reviewed by me and considered in my medical decision making (see chart for details).     CXR shows mild bronchitic changes w/o infiltrate Will try another week of prednisone along with albuterol inhaler Prescription to hold with expiration date for doxycycline. Pt to fill if persistent fever develops or not improving in 1 week.    Final Clinical Impressions(s) / UC Diagnoses   Final diagnoses:  Acute bronchitis due to other specified organisms  Chest tightness    ED Discharge Orders        Ordered    predniSONE (DELTASONE) 20 MG tablet     05/18/17 1433    albuterol (PROVENTIL HFA;VENTOLIN HFA) 108 (90 Base) MCG/ACT inhaler  Every 6 hours PRN     05/18/17 1433      doxycycline (VIBRAMYCIN) 100 MG capsule  2 times daily    Comments:  Void after 05/31/16   05/18/17 1433       Controlled Substance Prescriptions [place] Controlled Substance Registry consulted? Not Applicable   Rolla Platehelps, Fionnuala Hemmerich O, PA-C 05/18/17 1451

## 2017-05-18 NOTE — Discharge Instructions (Signed)
°  Your symptoms are likely due to a virus such as the common cold, however, if you developing worsening chest congestion with shortness of breath, persistent fever (>100.4*F) for 3 days, or symptoms not improving in 4-5 days, you may fill the antibiotic (doxycycline.  If you do fill the antibiotic,  please take antibiotics as prescribed and be sure to complete entire course even if you start to feel better to ensure infection does not come back.

## 2017-05-18 NOTE — ED Triage Notes (Signed)
Was treated last week with prednisone and z-pac not better, her doc is off today she is very winded and needs x-ray.

## 2017-06-02 DIAGNOSIS — M3500 Sicca syndrome, unspecified: Secondary | ICD-10-CM | POA: Diagnosis not present

## 2017-06-02 DIAGNOSIS — L659 Nonscarring hair loss, unspecified: Secondary | ICD-10-CM | POA: Diagnosis not present

## 2017-06-02 DIAGNOSIS — Z0001 Encounter for general adult medical examination with abnormal findings: Secondary | ICD-10-CM | POA: Diagnosis not present

## 2017-06-02 DIAGNOSIS — J309 Allergic rhinitis, unspecified: Secondary | ICD-10-CM | POA: Diagnosis not present

## 2017-06-08 ENCOUNTER — Ambulatory Visit (INDEPENDENT_AMBULATORY_CARE_PROVIDER_SITE_OTHER): Payer: Federal, State, Local not specified - PPO | Admitting: Neurology

## 2017-06-08 DIAGNOSIS — G4733 Obstructive sleep apnea (adult) (pediatric): Secondary | ICD-10-CM

## 2017-06-08 DIAGNOSIS — R51 Headache: Secondary | ICD-10-CM

## 2017-06-08 DIAGNOSIS — G478 Other sleep disorders: Secondary | ICD-10-CM

## 2017-06-08 DIAGNOSIS — Z6841 Body Mass Index (BMI) 40.0 and over, adult: Secondary | ICD-10-CM

## 2017-06-08 DIAGNOSIS — G930 Cerebral cysts: Secondary | ICD-10-CM

## 2017-06-08 DIAGNOSIS — R42 Dizziness and giddiness: Secondary | ICD-10-CM

## 2017-06-08 DIAGNOSIS — G4720 Circadian rhythm sleep disorder, unspecified type: Secondary | ICD-10-CM

## 2017-06-08 DIAGNOSIS — R519 Headache, unspecified: Secondary | ICD-10-CM

## 2017-06-13 ENCOUNTER — Other Ambulatory Visit: Payer: Self-pay | Admitting: Neurology

## 2017-06-13 ENCOUNTER — Telehealth: Payer: Self-pay

## 2017-06-13 DIAGNOSIS — R42 Dizziness and giddiness: Secondary | ICD-10-CM

## 2017-06-13 DIAGNOSIS — Z6841 Body Mass Index (BMI) 40.0 and over, adult: Secondary | ICD-10-CM

## 2017-06-13 DIAGNOSIS — R0600 Dyspnea, unspecified: Secondary | ICD-10-CM | POA: Diagnosis not present

## 2017-06-13 DIAGNOSIS — G4730 Sleep apnea, unspecified: Secondary | ICD-10-CM | POA: Diagnosis not present

## 2017-06-13 DIAGNOSIS — G4733 Obstructive sleep apnea (adult) (pediatric): Secondary | ICD-10-CM

## 2017-06-13 DIAGNOSIS — R062 Wheezing: Secondary | ICD-10-CM | POA: Diagnosis not present

## 2017-06-13 DIAGNOSIS — G4720 Circadian rhythm sleep disorder, unspecified type: Secondary | ICD-10-CM

## 2017-06-13 DIAGNOSIS — G930 Cerebral cysts: Secondary | ICD-10-CM | POA: Diagnosis not present

## 2017-06-13 DIAGNOSIS — G478 Other sleep disorders: Secondary | ICD-10-CM

## 2017-06-13 NOTE — Procedures (Signed)
PATIENT'S NAME:  Rebecca Hahn, Rebecca Hahn DOB:      05-25-1955      MR#:    [id number]     DATE OF RECORDING: 06/08/2017 REFERRING M.D.: ER referral Study Performed:   Baseline Polysomnogram HISTORY:  62 year old woman with a history of recurrent headaches, Sjogren's syndrome, gout, arthritis, chronic low back pain, and morbid obesity, who reports dizziness and a prior diagnosis of OSA. The patient endorsed the Epworth Sleepiness Scale at 18 points. The patient's weight 262 pounds with a height of 67 (inches), resulting in a BMI of 41.2 kg/m2. The patient's neck circumference measured 16.5 inches.  CURRENT MEDICATIONS: Zyloprim, Aspirin, B Complex-C, Oscal w/D, Vitamin D3, Klonopin, Vitamin B12, Restasis, Diclofenac sodium CR, Neurontin, Plaquenil, Antivert, Glucophage, Salagen, Crestor, Zoloft, Desyrel, Vitamin B1-12   PROCEDURE:  This is a multichannel digital polysomnogram utilizing the Somnostar 11.2 system.  Electrodes and sensors were applied and monitored per AASM Specifications.   EEG, EOG, Chin and Limb EMG, were sampled at 200 Hz.  ECG, Snore and Nasal Pressure, Thermal Airflow, Respiratory Effort, CPAP Flow and Pressure, Oximetry was sampled at 50 Hz. Digital video and audio were recorded.      BASELINE STUDY  Lights Out was at 20:59 and Lights On at 05:00.  Total recording time (TRT) was 481.5 minutes, with a total sleep time (TST) of  369.5 minutes.   The patient's sleep latency was 73 minutes, which is delayed. REM latency was 155 minutes, which is delayed. The sleep efficiency was 76.7 %.     SLEEP ARCHITECTURE: WASO (Wake after sleep onset) was 41 minutes with mild sleep fragmentation noted. There were 19.5 minutes in Stage N1, 304.5 minutes Stage N2, 0 minutes Stage N3 and 45.5 minutes in Stage REM.  The percentage of Stage N1 was 5.3%, Stage N2 was 82.4%, which is markedly increased, Stage N3 was absent, and Stage R (REM sleep) was 12.3%, which is reduced. The arousals were noted as: 40 were  spontaneous, 3 were associated with PLMs, 15 were associated with respiratory events.  Audio and video analysis did not show any abnormal or unusual movements, behaviors, but she was noted to have sleep talking, particularly in REM sleep. The patient took no bathroom breaks. Mild to moderate snoring was noted. The EKG was in keeping with normal sinus rhythm (NSR).  RESPIRATORY ANALYSIS:  There were a total of 42 respiratory events:  19 obstructive apneas, 0 central apneas and 1 mixed apneas with a total of 20 apneas and an apnea index (AI) of 3.2 /hour. There were 22 hypopneas with a hypopnea index of 3.6 /hour. The patient also had 0 respiratory event related arousals (RERAs).      The total APNEA/HYPOPNEA INDEX (AHI) was 6.8/hour and the total RESPIRATORY DISTURBANCE INDEX was 6.8 /hour.  14 events occurred in REM sleep and 37 events in NREM. The REM AHI was 18.5 /hour, versus a non-REM AHI of 5.2. The patient spent 130.5 minutes of total sleep time in the supine position and 239 minutes in non-supine.. The supine AHI was 3.2 versus a non-supine AHI of 8.8.  OXYGEN SATURATION & C02:  The Wake baseline 02 saturation was 94%, with the lowest being 81%. Time spent below 89% saturation equaled 18 minutes.  PERIODIC LIMB MOVEMENTS: The patient had a total of 6 Periodic Limb Movements.  The Periodic Limb Movement (PLM) index was 1. and the PLM Arousal index was .5/hour. Post-study, the patient indicated that sleep was the same as usual.   IMPRESSION:  1. Obstructive Sleep Apnea (OSA) 2. Sleep Talking  3. Dysfunctions associated with sleep stages or arousal from sleep  RECOMMENDATIONS: 1. This study demonstrates overall mild obstructive sleep apnea, moderate in REM sleep with a total AHI of 6.8/hour, REM AHI of 18.5/hour, and O2 nadir of 81%. Given the patient's medical history and sleep related complaints, a full-night CPAP titration study is recommended to optimize therapy. Other treatment options may  include avoidance of supine sleep position along with weight loss, upper airway or jaw surgery in selected patients or the use of an oral appliance in certain patients. ENT evaluation and/or consultation with a maxillofacial surgeon or dentist may be feasible in some instances. 2. Please note that untreated obstructive sleep apnea carries additional perioperative morbidity. Patients with significant obstructive sleep apnea should receive perioperative PAP therapy and the surgeons and particularly the anesthesiologist should be informed of the diagnosis and the severity of the sleep disordered breathing. 3. Sleep related mumbling and talking was noted, which is generally considered a benign parasomnia, typically noted during NREM sleep. Her vocalizations were more during REM sleep, and could be a sign for REM behavior disorder. Clinical correlation is recommended. Exact pathophysiology of sleep talking (somniloqui) is not known (occurs during brief arousals) and no specific treatment is available, and typically not sought by the patient. 4. This study shows sleep fragmentation and abnormal sleep stage percentages; these are nonspecific findings and per se do not signify an intrinsic sleep disorder or a cause for the patient's sleep-related symptoms. Causes include (but are not limited to) the first night effect of the sleep study, circadian rhythm disturbances, medication effect or an underlying mood disorder or medical problem.  5. The patient should be cautioned not to drive, work at heights, or operate dangerous or heavy equipment when tired or sleepy. Review and reiteration of good sleep hygiene measures should be pursued with any patient. 6. The patient will be [facility] discussion of the test results and further management strategies. The referring provider will be notified of the test results.  I certify that I have reviewed the entire raw data recording prior to the  issuance of this report in accordance with the Standards of Accreditation of the American Academy of Sleep Medicine (AASM)   Huston FoleySaima Maleya Leever, MD, PhD Diplomat, American Board of Psychiatry and Neurology (Neurology and Sleep Medicine)

## 2017-06-13 NOTE — Telephone Encounter (Signed)
I called pt. I advised pt that Dr. Frances FurbishAthar reviewed their sleep study results and found that pt has mild osa, moderate in REM sleep, with an O2 nadir of 81% and recommends that pt be treated with a cpap. Dr. Frances FurbishAthar recommends that pt return for a repeat sleep study in order to properly titrate the cpap and ensure a good mask fit. Pt is agreeable to returning for a titration study. I advised pt that our sleep lab will file with pt's insurance and call pt to schedule the sleep study when we hear back from the pt's insurance regarding coverage of this sleep study. Pt verbalized understanding of results. Pt had no questions at this time but was encouraged to call back if questions arise.  Pt asked if I could fax her results to the [facility]. She will fill out a medical release form and fax it to me with the fax number of the [facility].

## 2017-06-13 NOTE — Progress Notes (Signed)
Patient referred by ER for dizziness, seen by me on 02/08/17, diagnostic PSG on 06/08/17.    Please call and notify the patient that the recent sleep study did confirm the diagnosis of mild obstructive sleep apnea, moderate in REM sleep with a total AHI of 6.8/hour, REM AHI of 18.5/hour, and O2 nadir of 81%. I recommend treatment for this in the form of CPAP. This will require a repeat sleep study for proper titration and mask fitting. Please explain to patient and arrange for a CPAP titration study. I have placed an order in the chart. Thanks.   Huston FoleySaima Samaiya Awadallah, MD, PhD [facility])

## 2017-06-13 NOTE — Telephone Encounter (Signed)
-----   Message from Huston FoleySaima Athar, MD sent at 06/13/2017  8:36 AM EST ----- Patient referred by ER for dizziness, seen by me on 02/08/17, diagnostic PSG on 06/08/17.    Please call and notify the patient that the recent sleep study did confirm the diagnosis of mild obstructive sleep apnea, moderate in REM sleep with a total AHI of 6.8/hour, REM AHI of 18.5/hour, and O2 nadir of 81%. I recommend treatment for this in the form of CPAP. This will require a repeat sleep study for proper titration and mask fitting. Please explain to patient and arrange for a CPAP titration study. I have placed an order in the chart. Thanks.   Huston FoleySaima Athar, MD, PhD [facility])

## 2017-06-29 ENCOUNTER — Encounter (HOSPITAL_COMMUNITY): Payer: Self-pay | Admitting: Psychiatry

## 2017-06-29 ENCOUNTER — Ambulatory Visit (INDEPENDENT_AMBULATORY_CARE_PROVIDER_SITE_OTHER): Payer: Federal, State, Local not specified - PPO | Admitting: Psychiatry

## 2017-06-29 VITALS — BP 138/76 | HR 88 | Ht 67.0 in | Wt 275.0 lb

## 2017-06-29 DIAGNOSIS — Z87891 Personal history of nicotine dependence: Secondary | ICD-10-CM | POA: Diagnosis not present

## 2017-06-29 DIAGNOSIS — M549 Dorsalgia, unspecified: Secondary | ICD-10-CM

## 2017-06-29 DIAGNOSIS — M2550 Pain in unspecified joint: Secondary | ICD-10-CM

## 2017-06-29 DIAGNOSIS — R062 Wheezing: Secondary | ICD-10-CM | POA: Diagnosis not present

## 2017-06-29 DIAGNOSIS — R0602 Shortness of breath: Secondary | ICD-10-CM | POA: Diagnosis not present

## 2017-06-29 DIAGNOSIS — G4733 Obstructive sleep apnea (adult) (pediatric): Secondary | ICD-10-CM | POA: Diagnosis not present

## 2017-06-29 DIAGNOSIS — M150 Primary generalized (osteo)arthritis: Secondary | ICD-10-CM | POA: Diagnosis not present

## 2017-06-29 DIAGNOSIS — Z915 Personal history of self-harm: Secondary | ICD-10-CM

## 2017-06-29 DIAGNOSIS — F3342 Major depressive disorder, recurrent, in full remission: Secondary | ICD-10-CM | POA: Diagnosis not present

## 2017-06-29 DIAGNOSIS — E1165 Type 2 diabetes mellitus with hyperglycemia: Secondary | ICD-10-CM | POA: Diagnosis not present

## 2017-06-29 MED ORDER — SERTRALINE HCL 100 MG PO TABS: 100.0000 mg | ORAL_TABLET | Freq: Every day | ORAL | 1 refills | Status: DC

## 2017-06-29 MED ORDER — TRAZODONE HCL 100 MG PO TABS: 100.0000 mg | ORAL_TABLET | Freq: Every day | ORAL | 1 refills | Status: AC

## 2017-06-29 MED ORDER — CLONAZEPAM 0.5 MG PO TABS: 0.5000 mg | ORAL_TABLET | Freq: Two times a day (BID) | ORAL | 1 refills | Status: AC

## 2017-06-29 NOTE — Progress Notes (Signed)
Psychiatric Initial Adult Assessment   Patient Identification: Rebecca Hahn MRN:  [id number] Date of Evaluation:  06/29/2017 Referral Source: self Chief Complaint:  transfer of care from [place] Visit Diagnosis:    ICD-10-CM   1. Recurrent major depressive disorder, in full remission (HCC) F33.42 traZODone (DESYREL) 100 MG tablet    sertraline (ZOLOFT) 100 MG tablet    clonazePAM (KLONOPIN) 0.5 MG tablet  2. 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  She recently transferred from the [employer] in [place] to [place] to be closer to her family and to eventually retire in this area.  She reports that she presents today for psychiatric transfer of care as she had been previously managed by [facility] in [place].  I have the records available to review and went over these with the patient.  She has been stable in regard to her depression on Zoloft 100 mg, trazodone 100 mg nightly, and clonazepam 0.5 mg 1-2 times per day.  She does not engage in any substance abuse, alcohol use, and has not been smoking cigarettes in over 40 years.  She feels that her mood and anxiety symptoms are well managed.  She was recently diagnosed with sleep apnea and realizes that some of her fatigue and low energy will improve with treatment of sleep apnea.  I spent time learning about her social circumstances, her daughter lives with her, daughter is age 68.  She reports that she and her daughter have a close relationship.  Patient reports that she enjoys walking, and enjoys spending time with her family.  She has health issues that she is connected with a primary care physician for her, including pain, Sjogren's disease, hypertension, osteoarthritis.  She anticipates she will likely retire in the coming  year.  She denies any suicidal thoughts, has 1 prior suicide attempt when she was in the [employer] in the 1970s, in the context of severe depression and dissatisfaction with her new job at the time which was working in [employer].  She reports that she was honorably discharged after she finished and completed her service requirement.  She is service-connected for eye problems and headache.  We agreed to continue the current medication regimen and follow-up in 3-4 months or sooner if needed.  I spent time educating the patient about sleep apnea and how this can contribute to treatment resistant depression, and encouraged her to follow up with her primary care physician to have this addressed.  Associated Signs/Symptoms: Depression Symptoms:  psychomotor retardation, fatigue, (Hypo) Manic Symptoms:  none Anxiety Symptoms:  none Psychotic Symptoms:  none PTSD Symptoms: Negative  Past Psychiatric History: Outpatient psychiatric treatment, never hospitalized, 1 prior suicide attempt by overdose in the 1970s, she did not receive treatment or tell anybody about this at the time  Previous Psychotropic Medications: Yes   Substance Abuse History in the last 12 months:  No.  Consequences of Substance Abuse: Negative  Past Medical History:  Past Medical History:  Diagnosis Date  . Anxiety   . Depression   . Diabetes mellitus, type II (HCC)   . Sjoegren syndrome (HCC)   . Sleep apnea   . Vertigo     Past Surgical History:  Procedure Laterality Date  . CESAREAN SECTION    . CHOLECYSTECTOMY    . TUBAL  LIGATION      Family Psychiatric History: None  Family History: History reviewed. No pertinent family history.  Social History:   Social History   Socioeconomic History  . Marital status: Single    Spouse name: None  . Number of children: None  . Years of education: None  . Highest education level: None  Social Needs  . Financial resource strain: None  . Food insecurity -  worry: None  . Food insecurity - inability: None  . Transportation needs - medical: None  . Transportation needs - non-medical: None  Occupational History  . None  Tobacco Use  . Smoking status: Former Games developermoker  . Smokeless tobacco: Never Used  Substance and Sexual Activity  . Alcohol use: No  . Drug use: No  . Sexual activity: None  Other Topics Concern  . None  Social History Narrative  . None    Additional Social History: Patient was married 27 years, has 3 children, 2 grandchildren, she and her ex-husband are on fair terms,  Allergies:   Allergies  Allergen Reactions  . Penicillins Rash    Has patient had a PCN reaction causing immediate rash, facial/tongue/throat swelling, SOB or lightheadedness with hypotension: No Has patient had a PCN reaction causing severe rash involving mucus membranes or skin necrosis: No Has patient had a PCN reaction that required hospitalization: No Has patient had a PCN reaction occurring within the last 10 years: No If all of the above answers are "NO", then may proceed with Cephalosporin use.      Metabolic Disorder Labs: No results found for: HGBA1C, MPG No results found for: PROLACTIN No results found for: CHOL, TRIG, HDL, CHOLHDL, VLDL, LDLCALC   Current Medications: Current Outpatient Medications  Medication Sig Dispense Refill  . allopurinol (ZYLOPRIM) 100 MG tablet Take 200 mg by mouth daily.     Marland Kitchen. aspirin 81 MG chewable tablet Chew 81 mg by mouth daily.    . B Complex-C (SUPER B COMPLEX PO) Take by mouth.    . calcium-vitamin D (OSCAL WITH D) 250-125 MG-UNIT tablet Take 1 tablet by mouth daily.    . Cholecalciferol (VITAMIN D-3) 5000 units TABS Take by mouth.    . clonazePAM (KLONOPIN) 0.5 MG tablet Take 1 tablet (0.5 mg total) by mouth 2 (two) times daily. 180 tablet 1  . Cyanocobalamin (VITAMIN B-12 SL) Place under the tongue.    . cycloSPORINE (RESTASIS) 0.05 % ophthalmic emulsion 1 drop 2 (two) times daily.    . Diclofenac  Sodium CR 100 MG 24 hr tablet Take 100 mg by mouth daily.    Marland Kitchen. doxycycline (VIBRAMYCIN) 100 MG capsule Take 1 capsule (100 mg total) by mouth 2 (two) times daily. One po bid x 7 days 14 capsule 0  . gabapentin (NEURONTIN) 100 MG capsule Take 100 mg by mouth 3 (three) times daily as needed.    . hydroxychloroquine (PLAQUENIL) 200 MG tablet Take 200 mg by mouth daily.    . IRON PO Take by mouth.    . meclizine (ANTIVERT) 12.5 MG tablet Take 12.5 mg by mouth 3 (three) times daily as needed for dizziness.    . metFORMIN (GLUCOPHAGE) 500 MG tablet Take 500 mg by mouth daily with breakfast.    . pilocarpine (SALAGEN) 5 MG tablet Take 5 mg by mouth 3 (three) times daily as needed.     . predniSONE (DELTASONE) 20 MG tablet 3 tabs po day one, then 2 po daily x 4 days 11 tablet 0  .  rosuvastatin (CRESTOR) 20 MG tablet Take 20 mg by mouth daily.    . sertraline (ZOLOFT) 100 MG tablet Take 1 tablet (100 mg total) by mouth at bedtime. 90 tablet 1  . traZODone (DESYREL) 100 MG tablet Take 1 tablet (100 mg total) by mouth at bedtime. 90 tablet 1  . VITAMIN B1-B12 PO Take by mouth.    Marland Kitchen albuterol (PROVENTIL HFA;VENTOLIN HFA) 108 (90 Base) MCG/ACT inhaler Inhale 1-2 puffs into the lungs every 6 (six) hours as needed for wheezing or shortness of breath. (Patient not taking: Reported on 06/29/2017) 1 Inhaler 0   No current facility-administered medications for this visit.     Neurologic: Headache: Negative Seizure: Negative Paresthesias:Yes  Musculoskeletal: Strength & Muscle Tone: within normal limits Gait & Station: normal Patient leans: N/A  Psychiatric Specialty Exam: Review of Systems  Constitutional: Negative.   HENT: Negative.   Respiratory: Positive for shortness of breath.   Cardiovascular: Negative.   Gastrointestinal: Negative.   Musculoskeletal: Positive for back pain and joint pain.  Neurological: Negative.   Psychiatric/Behavioral: Negative.     Blood pressure 138/76, pulse 88, height  5\' 7"  (1.702 m), weight 275 lb (124.7 kg).Body mass index is 43.07 kg/m.  General Appearance: Casual and Well Groomed  Eye Contact:  Good  Speech:  Clear and Coherent and Normal Rate  Volume:  Normal  Mood:  Euthymic  Affect:  Congruent  Thought Process:  Goal Directed and Descriptions of Associations: Intact  Orientation:  Full (Time, Place, and Person)  Thought Content:  Logical  Suicidal Thoughts:  No  Homicidal Thoughts:  No  Memory:  Immediate;   Good  Judgement:  Good  Insight:  Good  Psychomotor Activity:  Normal  Concentration:  Concentration: Good  Recall:  Good  Fund of Knowledge:Good  Language: Good  Akathisia:  Negative  Handed:  Right  AIMS (if indicated):  n/a  Assets:  Communication Skills Desire for Improvement Financial Resources/Insurance Housing Leisure Time Resilience Social Support Talents/Skills Transportation Vocational/Educational  ADL's:  Intact  Cognition: WNL  Sleep:  Fair, sleep apnea being addressed    Treatment Plan Summary: Antonina Madruga presents for medication management transfer of care for depression and anxiety.  I reviewed the records from [facility] in [place].  The patient presents as generally euthymic with some breakthrough symptoms of fatigue and low energy, which I suspect will improve with treatment of her sleep apnea which was recently diagnosed.  She does not have any acute issues in terms of unsafe thoughts towards herself or others, does not engage in any substance abuse, and has a good social support system.  We will continue her regular psychiatric medication regimen as below and follow-up in 3-4 months or sooner if needed.  1. Recurrent major depressive disorder, in full remission (HCC)   2. Obstructive sleep apnea syndrome     Status of current problems: stable  Labs Ordered: No orders of the defined types were placed in this encounter.   Labs Reviewed: Reviewed polysomnography, and recent  brain MRI  Collateral Obtained/Records Reviewed: Reviewed collateral records from [facility] in [place]  Plan:  Continue Zoloft 100 mg daily Continue trazodone 100 mg nightly Continue clonazepam 0.5 mg 1-2 times daily Educated on sleep apnea, patient scheduled in march for titration rtc 4 months  I spent 30 minutes with the patient in direct face-to-face clinical care.  Greater than 50% of this time was spent in counseling and coordination of care with the  patient.    Burnard Leigh, MD 2/8/201910:49 AM

## 2017-07-09 DIAGNOSIS — M79643 Pain in unspecified hand: Secondary | ICD-10-CM | POA: Diagnosis not present

## 2017-07-09 DIAGNOSIS — M1990 Unspecified osteoarthritis, unspecified site: Secondary | ICD-10-CM | POA: Diagnosis not present

## 2017-07-09 DIAGNOSIS — M549 Dorsalgia, unspecified: Secondary | ICD-10-CM | POA: Diagnosis not present

## 2017-07-09 DIAGNOSIS — M3500 Sicca syndrome, unspecified: Secondary | ICD-10-CM | POA: Diagnosis not present

## 2017-07-11 DIAGNOSIS — E785 Hyperlipidemia, unspecified: Secondary | ICD-10-CM | POA: Diagnosis not present

## 2017-07-11 DIAGNOSIS — M150 Primary generalized (osteo)arthritis: Secondary | ICD-10-CM | POA: Diagnosis not present

## 2017-07-11 DIAGNOSIS — R42 Dizziness and giddiness: Secondary | ICD-10-CM | POA: Diagnosis not present

## 2017-07-11 DIAGNOSIS — E1165 Type 2 diabetes mellitus with hyperglycemia: Secondary | ICD-10-CM | POA: Diagnosis not present

## 2017-07-16 ENCOUNTER — Telehealth: Payer: Self-pay | Admitting: Neurology

## 2017-07-16 NOTE — Telephone Encounter (Signed)
The pt left me a voicemail stating [facility]. She cancelled her cpap study that was scheduled for Friday 3/. Pt stated if the [facility] was unsuccessful in getting her [facility] she would call back and r/s study.

## 2017-07-24 DIAGNOSIS — M3501 Sicca syndrome with keratoconjunctivitis: Secondary | ICD-10-CM | POA: Diagnosis not present

## 2017-07-26 ENCOUNTER — Encounter (HOSPITAL_COMMUNITY): Payer: Self-pay | Admitting: Psychiatry

## 2017-07-26 ENCOUNTER — Telehealth (HOSPITAL_COMMUNITY): Payer: Self-pay | Admitting: Psychiatry

## 2017-07-26 NOTE — Telephone Encounter (Signed)
D:  Dr. Rene KocherEksir referred pt to MH-IOP.  A:  Placed call to pt and left her a vm to call writer back re: IOP.  Informed Dr. Rene KocherEksir.   Jeri Modenaita Clark, M.Ed,CNA

## 2017-07-27 ENCOUNTER — Telehealth (HOSPITAL_COMMUNITY): Payer: Self-pay | Admitting: Psychiatry

## 2017-07-30 DIAGNOSIS — J449 Chronic obstructive pulmonary disease, unspecified: Secondary | ICD-10-CM | POA: Diagnosis not present

## 2017-07-30 DIAGNOSIS — Z6841 Body Mass Index (BMI) 40.0 and over, adult: Secondary | ICD-10-CM | POA: Diagnosis not present

## 2017-07-30 DIAGNOSIS — M3500 Sicca syndrome, unspecified: Secondary | ICD-10-CM | POA: Diagnosis not present

## 2017-07-30 DIAGNOSIS — R51 Headache: Secondary | ICD-10-CM | POA: Diagnosis not present

## 2017-08-07 DIAGNOSIS — Z713 Dietary counseling and surveillance: Secondary | ICD-10-CM | POA: Diagnosis not present

## 2017-08-07 DIAGNOSIS — R8290 Unspecified abnormal findings in urine: Secondary | ICD-10-CM | POA: Diagnosis not present

## 2017-08-07 DIAGNOSIS — I10 Essential (primary) hypertension: Secondary | ICD-10-CM | POA: Diagnosis not present

## 2017-08-07 DIAGNOSIS — N3941 Urge incontinence: Secondary | ICD-10-CM | POA: Diagnosis not present

## 2017-08-07 DIAGNOSIS — E669 Obesity, unspecified: Secondary | ICD-10-CM | POA: Diagnosis not present

## 2017-08-07 DIAGNOSIS — R350 Frequency of micturition: Secondary | ICD-10-CM | POA: Diagnosis not present

## 2017-08-07 DIAGNOSIS — E785 Hyperlipidemia, unspecified: Secondary | ICD-10-CM | POA: Diagnosis not present

## 2017-08-07 DIAGNOSIS — Z6832 Body mass index (BMI) 32.0-32.9, adult: Secondary | ICD-10-CM | POA: Diagnosis not present

## 2017-08-17 ENCOUNTER — Other Ambulatory Visit (HOSPITAL_COMMUNITY): Payer: Self-pay | Admitting: Neurosurgery

## 2017-08-17 DIAGNOSIS — Q018 Encephalocele of other sites: Secondary | ICD-10-CM

## 2017-08-23 ENCOUNTER — Ambulatory Visit (HOSPITAL_COMMUNITY)
Admission: RE | Admit: 2017-08-23 | Discharge: 2017-08-23 | Disposition: A | Discharge status: Home / Self Care | Payer: Non-veteran care | Source: Ambulatory Visit | Attending: Neurosurgery | Admitting: Neurosurgery

## 2017-08-23 ENCOUNTER — Encounter (HOSPITAL_COMMUNITY): Payer: Self-pay

## 2017-08-23 ENCOUNTER — Other Ambulatory Visit (HOSPITAL_COMMUNITY): Payer: Federal, State, Local not specified - PPO | Admitting: Psychiatry

## 2017-08-23 ENCOUNTER — Ambulatory Visit (HOSPITAL_COMMUNITY): Admission: RE | Admit: 2017-08-23 | Payer: Federal, State, Local not specified - PPO | Source: Ambulatory Visit

## 2017-08-23 DIAGNOSIS — G9389 Other specified disorders of brain: Secondary | ICD-10-CM | POA: Diagnosis not present

## 2017-08-23 DIAGNOSIS — Q018 Encephalocele of other sites: Secondary | ICD-10-CM

## 2017-08-23 MED ORDER — GADOBENATE DIMEGLUMINE 529 MG/ML IV SOLN
20.0000 mL | Freq: Once | INTRAVENOUS | Status: AC | PRN
  Administered 2017-08-23: 20 mL via INTRAVENOUS

## 2017-08-24 ENCOUNTER — Other Ambulatory Visit (HOSPITAL_COMMUNITY): Payer: Federal, State, Local not specified - PPO

## 2017-08-24 LAB — POCT I-STAT CREATININE: Creatinine, Ser: 1.1 mg/dL — ABNORMAL HIGH (ref 0.44–1.00)

## 2017-08-27 ENCOUNTER — Other Ambulatory Visit (HOSPITAL_COMMUNITY): Payer: Federal, State, Local not specified - PPO

## 2017-08-27 DIAGNOSIS — Z1231 Encounter for screening mammogram for malignant neoplasm of breast: Secondary | ICD-10-CM | POA: Diagnosis not present

## 2017-08-28 ENCOUNTER — Other Ambulatory Visit (HOSPITAL_COMMUNITY): Payer: Federal, State, Local not specified - PPO

## 2017-08-29 ENCOUNTER — Other Ambulatory Visit (HOSPITAL_COMMUNITY): Payer: Federal, State, Local not specified - PPO

## 2017-08-30 ENCOUNTER — Other Ambulatory Visit (HOSPITAL_COMMUNITY): Payer: Federal, State, Local not specified - PPO

## 2017-08-31 ENCOUNTER — Other Ambulatory Visit (HOSPITAL_COMMUNITY): Payer: Federal, State, Local not specified - PPO

## 2017-09-03 ENCOUNTER — Other Ambulatory Visit (HOSPITAL_COMMUNITY): Payer: Federal, State, Local not specified - PPO

## 2017-09-03 DIAGNOSIS — M9903 Segmental and somatic dysfunction of lumbar region: Secondary | ICD-10-CM | POA: Diagnosis not present

## 2017-09-03 DIAGNOSIS — M109 Gout, unspecified: Secondary | ICD-10-CM | POA: Diagnosis not present

## 2017-09-03 DIAGNOSIS — M62838 Other muscle spasm: Secondary | ICD-10-CM | POA: Diagnosis not present

## 2017-09-03 DIAGNOSIS — M5408 Panniculitis affecting regions of neck and back, sacral and sacrococcygeal region: Secondary | ICD-10-CM | POA: Diagnosis not present

## 2017-09-04 ENCOUNTER — Other Ambulatory Visit (HOSPITAL_COMMUNITY): Payer: Federal, State, Local not specified - PPO

## 2017-09-05 ENCOUNTER — Other Ambulatory Visit (HOSPITAL_COMMUNITY): Payer: Federal, State, Local not specified - PPO

## 2017-09-06 ENCOUNTER — Other Ambulatory Visit (HOSPITAL_COMMUNITY): Payer: Federal, State, Local not specified - PPO

## 2017-09-07 ENCOUNTER — Other Ambulatory Visit (HOSPITAL_COMMUNITY): Payer: Federal, State, Local not specified - PPO

## 2017-09-10 ENCOUNTER — Other Ambulatory Visit (HOSPITAL_COMMUNITY): Payer: Federal, State, Local not specified - PPO

## 2017-09-11 ENCOUNTER — Other Ambulatory Visit (HOSPITAL_COMMUNITY): Payer: Federal, State, Local not specified - PPO

## 2017-09-11 DIAGNOSIS — R350 Frequency of micturition: Secondary | ICD-10-CM | POA: Diagnosis not present

## 2017-09-12 ENCOUNTER — Other Ambulatory Visit (HOSPITAL_COMMUNITY): Payer: Federal, State, Local not specified - PPO

## 2017-09-12 ENCOUNTER — Ambulatory Visit (HOSPITAL_COMMUNITY): Payer: Self-pay | Admitting: Psychiatry

## 2017-09-13 ENCOUNTER — Other Ambulatory Visit (HOSPITAL_COMMUNITY): Payer: Federal, State, Local not specified - PPO

## 2017-09-14 ENCOUNTER — Other Ambulatory Visit (HOSPITAL_COMMUNITY): Payer: Federal, State, Local not specified - PPO

## 2017-09-17 ENCOUNTER — Other Ambulatory Visit (HOSPITAL_COMMUNITY): Payer: Federal, State, Local not specified - PPO

## 2017-09-17 DIAGNOSIS — R030 Elevated blood-pressure reading, without diagnosis of hypertension: Secondary | ICD-10-CM | POA: Diagnosis not present

## 2017-09-17 DIAGNOSIS — Z6841 Body Mass Index (BMI) 40.0 and over, adult: Secondary | ICD-10-CM | POA: Diagnosis not present

## 2017-09-17 DIAGNOSIS — Q018 Encephalocele of other sites: Secondary | ICD-10-CM | POA: Diagnosis not present

## 2017-09-18 ENCOUNTER — Other Ambulatory Visit (HOSPITAL_COMMUNITY): Payer: Federal, State, Local not specified - PPO

## 2017-09-19 ENCOUNTER — Other Ambulatory Visit (HOSPITAL_COMMUNITY): Payer: Federal, State, Local not specified - PPO

## 2017-09-19 DIAGNOSIS — M3501 Sicca syndrome with keratoconjunctivitis: Secondary | ICD-10-CM | POA: Diagnosis not present

## 2017-09-20 ENCOUNTER — Other Ambulatory Visit (HOSPITAL_COMMUNITY): Payer: Federal, State, Local not specified - PPO

## 2017-09-21 ENCOUNTER — Other Ambulatory Visit (HOSPITAL_COMMUNITY): Payer: Federal, State, Local not specified - PPO

## 2017-09-24 ENCOUNTER — Other Ambulatory Visit (HOSPITAL_COMMUNITY): Payer: Federal, State, Local not specified - PPO

## 2017-09-25 ENCOUNTER — Other Ambulatory Visit (HOSPITAL_COMMUNITY): Payer: Federal, State, Local not specified - PPO

## 2017-09-26 ENCOUNTER — Other Ambulatory Visit (HOSPITAL_COMMUNITY): Payer: Federal, State, Local not specified - PPO

## 2017-09-27 ENCOUNTER — Other Ambulatory Visit (HOSPITAL_COMMUNITY): Payer: Federal, State, Local not specified - PPO

## 2017-09-28 ENCOUNTER — Other Ambulatory Visit (HOSPITAL_COMMUNITY): Payer: Federal, State, Local not specified - PPO

## 2017-09-28 DIAGNOSIS — G43719 Chronic migraine without aura, intractable, without status migrainosus: Secondary | ICD-10-CM | POA: Diagnosis not present

## 2017-10-01 ENCOUNTER — Other Ambulatory Visit (HOSPITAL_COMMUNITY): Payer: Federal, State, Local not specified - PPO

## 2017-10-02 ENCOUNTER — Other Ambulatory Visit (HOSPITAL_COMMUNITY): Payer: Federal, State, Local not specified - PPO

## 2017-10-03 ENCOUNTER — Other Ambulatory Visit (HOSPITAL_COMMUNITY): Payer: Federal, State, Local not specified - PPO

## 2017-10-03 DIAGNOSIS — M2560 Stiffness of unspecified joint, not elsewhere classified: Secondary | ICD-10-CM | POA: Diagnosis not present

## 2017-10-03 DIAGNOSIS — M9903 Segmental and somatic dysfunction of lumbar region: Secondary | ICD-10-CM | POA: Diagnosis not present

## 2017-10-03 DIAGNOSIS — M62838 Other muscle spasm: Secondary | ICD-10-CM | POA: Diagnosis not present

## 2017-10-03 DIAGNOSIS — M109 Gout, unspecified: Secondary | ICD-10-CM | POA: Diagnosis not present

## 2017-10-04 ENCOUNTER — Other Ambulatory Visit (HOSPITAL_COMMUNITY): Payer: Federal, State, Local not specified - PPO

## 2017-10-05 ENCOUNTER — Other Ambulatory Visit (HOSPITAL_COMMUNITY): Payer: Federal, State, Local not specified - PPO

## 2017-10-08 ENCOUNTER — Other Ambulatory Visit (HOSPITAL_COMMUNITY): Payer: Federal, State, Local not specified - PPO

## 2017-10-09 ENCOUNTER — Other Ambulatory Visit (HOSPITAL_COMMUNITY): Payer: Federal, State, Local not specified - PPO

## 2017-10-10 ENCOUNTER — Other Ambulatory Visit (HOSPITAL_COMMUNITY): Payer: Federal, State, Local not specified - PPO

## 2017-10-11 ENCOUNTER — Other Ambulatory Visit (HOSPITAL_COMMUNITY): Payer: Federal, State, Local not specified - PPO

## 2017-10-12 ENCOUNTER — Other Ambulatory Visit (HOSPITAL_COMMUNITY): Payer: Federal, State, Local not specified - PPO

## 2017-10-16 ENCOUNTER — Other Ambulatory Visit (HOSPITAL_COMMUNITY): Payer: Federal, State, Local not specified - PPO

## 2017-10-17 ENCOUNTER — Other Ambulatory Visit (HOSPITAL_COMMUNITY): Payer: Federal, State, Local not specified - PPO

## 2017-10-18 DIAGNOSIS — Z9989 Dependence on other enabling machines and devices: Secondary | ICD-10-CM | POA: Diagnosis not present

## 2017-10-18 DIAGNOSIS — G4733 Obstructive sleep apnea (adult) (pediatric): Secondary | ICD-10-CM | POA: Diagnosis not present

## 2017-10-23 ENCOUNTER — Ambulatory Visit (HOSPITAL_COMMUNITY): Payer: Federal, State, Local not specified - PPO | Admitting: Psychiatry

## 2017-10-23 ENCOUNTER — Encounter (HOSPITAL_COMMUNITY): Payer: Self-pay | Admitting: Psychiatry

## 2017-10-23 VITALS — BP 137/84 | HR 88 | Ht 67.0 in | Wt 266.8 lb

## 2017-10-23 DIAGNOSIS — Z87891 Personal history of nicotine dependence: Secondary | ICD-10-CM

## 2017-10-23 DIAGNOSIS — F411 Generalized anxiety disorder: Secondary | ICD-10-CM

## 2017-10-23 DIAGNOSIS — G4733 Obstructive sleep apnea (adult) (pediatric): Secondary | ICD-10-CM | POA: Diagnosis not present

## 2017-10-23 DIAGNOSIS — F3342 Major depressive disorder, recurrent, in full remission: Secondary | ICD-10-CM

## 2017-10-23 MED ORDER — SERTRALINE HCL 100 MG PO TABS: 100.0000 mg | ORAL_TABLET | Freq: Every day | ORAL | 1 refills | Status: AC

## 2017-10-23 NOTE — Progress Notes (Signed)
BH MD/PA/NP OP Progress Note  10/23/2017 9:11 AM Rebecca Hahn  MRN:  [id number]  Chief Complaint: depression follow-up  HPI: Rebecca Hahn reports overall good mood stability, she did receive her CPAP and is working on trying to use this.  Reiterated the importance of sleep routine.  She has retired since March 30, and we spent time discussing some of the activating behaviors that may help her to develop a routine including volunteering, going for walks.  She is considering adopting a dog.  No acute safety concerns, medications appear to be stable at the current doses.  We will follow-up in 2-3 months or sooner if needed.  Disclosed to patient that this Clinical research associate is leaving this practice at the end of August 2019, and patients always has the right to choose their provider. Reassured patient that office will work to provide smooth transition of care whether they wish to remain at this office, or to continue with this provider, or seek alternative care options in community.  They expressed understanding.   Visit Diagnosis:    ICD-10-CM   1. Obstructive sleep apnea syndrome G47.33   2. Recurrent major depressive disorder, in full remission (HCC) F33.42 sertraline (ZOLOFT) 100 MG tablet  3. GAD (generalized anxiety disorder) F41.1     Past Psychiatric History: See intake H&P for full details. Reviewed, with no updates at this time.   Past Medical History:  Past Medical History:  Diagnosis Date  . Anxiety   . Depression   . Diabetes mellitus, type II (HCC)   . Sjoegren syndrome   . Sleep apnea   . Vertigo     Past Surgical History:  Procedure Laterality Date  . CESAREAN SECTION    . CHOLECYSTECTOMY    . TUBAL LIGATION      Family Psychiatric History: See intake H&P for full details. Reviewed, with no updates at this time.   Family History: History reviewed. No pertinent family history.  Social History:  Social History   Socioeconomic History  . Marital status: Single    Spouse  name: Not on file  . Number of children: Not on file  . Years of education: Not on file  . Highest education level: Not on file  Occupational History  . Not on file  Social Needs  . Financial resource strain: Not on file  . Food insecurity:    Worry: Not on file    Inability: Not on file  . Transportation needs:    Medical: Not on file    Non-medical: Not on file  Tobacco Use  . Smoking status: Former Games developer  . Smokeless tobacco: Never Used  Substance and Sexual Activity  . Alcohol use: No  . Drug use: No  . Sexual activity: Not on file  Lifestyle  . Physical activity:    Days per week: Not on file    Minutes per session: Not on file  . Stress: Not on file  Relationships  . Social connections:    Talks on phone: Not on file    Gets together: Not on file    Attends religious service: Not on file    Active member of club or organization: Not on file    Attends meetings of clubs or organizations: Not on file    Relationship status: Not on file  Other Topics Concern  . Not on file  Social History Narrative  . Not on file    Allergies:  Allergies  Allergen Reactions  . Penicillins Rash  Has patient had a PCN reaction causing immediate rash, facial/tongue/throat swelling, SOB or lightheadedness with hypotension: No Has patient had a PCN reaction causing severe rash involving mucus membranes or skin necrosis: No Has patient had a PCN reaction that required hospitalization: No Has patient had a PCN reaction occurring within the last 10 years: No If all of the above answers are "NO", then may proceed with Cephalosporin use.      Metabolic Disorder Labs: No results found for: HGBA1C, MPG No results found for: PROLACTIN No results found for: CHOL, TRIG, HDL, CHOLHDL, VLDL, LDLCALC No results found for: TSH  Therapeutic Level Labs: No results found for: LITHIUM No results found for: VALPROATE No components found for:  CBMZ  Current Medications: Current  Outpatient Medications  Medication Sig Dispense Refill  . albuterol (PROVENTIL HFA;VENTOLIN HFA) 108 (90 Base) MCG/ACT inhaler Inhale 1-2 puffs into the lungs every 6 (six) hours as needed for wheezing or shortness of breath. 1 Inhaler 0  . allopurinol (ZYLOPRIM) 100 MG tablet Take 200 mg by mouth daily.     Marland Kitchen aspirin 81 MG chewable tablet Chew 81 mg by mouth daily.    . B Complex-C (SUPER B COMPLEX PO) Take by mouth.    . calcium-vitamin D (OSCAL WITH D) 250-125 MG-UNIT tablet Take 1 tablet by mouth daily.    . Cholecalciferol (VITAMIN D-3) 5000 units TABS Take by mouth.    . clonazePAM (KLONOPIN) 0.5 MG tablet Take 1 tablet (0.5 mg total) by mouth 2 (two) times daily. 180 tablet 1  . Cyanocobalamin (VITAMIN B-12 SL) Place under the tongue.    . cycloSPORINE (RESTASIS) 0.05 % ophthalmic emulsion 1 drop 2 (two) times daily.    . Diclofenac Sodium CR 100 MG 24 hr tablet Take 100 mg by mouth daily.    Marland Kitchen doxycycline (VIBRAMYCIN) 100 MG capsule Take 1 capsule (100 mg total) by mouth 2 (two) times daily. One po bid x 7 days 14 capsule 0  . gabapentin (NEURONTIN) 100 MG capsule Take 100 mg by mouth 3 (three) times daily as needed.    . meclizine (ANTIVERT) 12.5 MG tablet Take 12.5 mg by mouth 3 (three) times daily as needed for dizziness.    . metFORMIN (GLUCOPHAGE) 500 MG tablet Take 500 mg by mouth daily with breakfast.    . pilocarpine (SALAGEN) 5 MG tablet Take 5 mg by mouth 3 (three) times daily as needed.     . predniSONE (DELTASONE) 20 MG tablet 3 tabs po day one, then 2 po daily x 4 days 11 tablet 0  . rosuvastatin (CRESTOR) 20 MG tablet Take 20 mg by mouth daily.    . sertraline (ZOLOFT) 100 MG tablet Take 1 tablet (100 mg total) by mouth at bedtime. 90 tablet 1  . traZODone (DESYREL) 100 MG tablet Take 1 tablet (100 mg total) by mouth at bedtime. 90 tablet 1  . VITAMIN B1-B12 PO Take by mouth.    . hydroxychloroquine (PLAQUENIL) 200 MG tablet Take 200 mg by mouth daily.    . IRON PO Take  by mouth.     No current facility-administered medications for this visit.      Musculoskeletal: Strength & Muscle Tone: within normal limits Gait & Station: normal Patient leans: N/A  Psychiatric Specialty Exam: ROS  Blood pressure 137/84, pulse 88, height 5\' 7"  (1.702 m), weight 266 lb 12.8 oz (121 kg), SpO2 99 %.Body mass index is 41.79 kg/m.  General Appearance: Casual and Fairly Groomed  Eye  Contact:  Fair  Speech:  Clear and Coherent and Normal Rate  Volume:  Normal  Mood:  Euthymic  Affect:  Appropriate and Congruent  Thought Process:  Goal Directed and Descriptions of Associations: Intact  Orientation:  Full (Time, Place, and Person)  Thought Content: Logical   Suicidal Thoughts:  No  Homicidal Thoughts:  No  Memory:  Immediate;   Fair  Judgement:  Fair  Insight:  Fair  Psychomotor Activity:  Normal  Concentration:  Attention Span: Fair  Recall:  FiservFair  Fund of Knowledge: Fair  Language: Fair  Akathisia:  Negative  Handed:  Right  AIMS (if indicated): not done  Assets:  Communication Skills Desire for Improvement Financial Resources/Insurance Housing  ADL's:  Intact  Cognition: WNL  Sleep:  Fair   Screenings:   Assessment and Plan: Education officer, environmentalYvette Hudlow resents with stable mood, anxiety, and is working on using her CPAP regularly.  She is now retired for the past 2 months and seems to be developing a routine so that she keeps herself mentally and physically busy and happy.  1. Obstructive sleep apnea syndrome   2. Recurrent major depressive disorder, in full remission (HCC)   3. GAD (generalized anxiety disorder)     Status of current problems: stable  Labs Ordered: No orders of the defined types were placed in this encounter.   Labs Reviewed: na  Collateral Obtained/Records Reviewed: na  Plan:  Continue Zoloft 100 mg nightly, trazodone 100 mg nightly, clonazepam 0.5 mg twice a day as needed Return to clinic in 2-3 months   Burnard LeighAlexander Arya Ahsan Esterline,  MD 10/23/2017, 9:11 AM

## 2017-10-29 DIAGNOSIS — M109 Gout, unspecified: Secondary | ICD-10-CM | POA: Diagnosis not present

## 2017-10-29 DIAGNOSIS — M9903 Segmental and somatic dysfunction of lumbar region: Secondary | ICD-10-CM | POA: Diagnosis not present

## 2017-10-29 DIAGNOSIS — M62838 Other muscle spasm: Secondary | ICD-10-CM | POA: Diagnosis not present

## 2017-10-29 DIAGNOSIS — M2560 Stiffness of unspecified joint, not elsewhere classified: Secondary | ICD-10-CM | POA: Diagnosis not present

## 2017-11-14 DIAGNOSIS — G4733 Obstructive sleep apnea (adult) (pediatric): Secondary | ICD-10-CM | POA: Diagnosis not present

## 2017-11-14 DIAGNOSIS — Z9989 Dependence on other enabling machines and devices: Secondary | ICD-10-CM | POA: Diagnosis not present

## 2017-11-14 DIAGNOSIS — Z13820 Encounter for screening for osteoporosis: Secondary | ICD-10-CM | POA: Diagnosis not present

## 2017-11-26 DIAGNOSIS — M2560 Stiffness of unspecified joint, not elsewhere classified: Secondary | ICD-10-CM | POA: Diagnosis not present

## 2017-11-26 DIAGNOSIS — G4733 Obstructive sleep apnea (adult) (pediatric): Secondary | ICD-10-CM | POA: Diagnosis not present

## 2017-11-26 DIAGNOSIS — M9903 Segmental and somatic dysfunction of lumbar region: Secondary | ICD-10-CM | POA: Diagnosis not present

## 2017-11-26 DIAGNOSIS — M109 Gout, unspecified: Secondary | ICD-10-CM | POA: Diagnosis not present

## 2017-11-26 DIAGNOSIS — M62838 Other muscle spasm: Secondary | ICD-10-CM | POA: Diagnosis not present

## 2017-11-29 DIAGNOSIS — M3500 Sicca syndrome, unspecified: Secondary | ICD-10-CM | POA: Diagnosis not present

## 2017-11-29 DIAGNOSIS — G4733 Obstructive sleep apnea (adult) (pediatric): Secondary | ICD-10-CM | POA: Diagnosis not present

## 2017-11-30 DIAGNOSIS — Z9989 Dependence on other enabling machines and devices: Secondary | ICD-10-CM | POA: Diagnosis not present

## 2017-11-30 DIAGNOSIS — G4733 Obstructive sleep apnea (adult) (pediatric): Secondary | ICD-10-CM | POA: Diagnosis not present

## 2017-12-07 DIAGNOSIS — M5408 Panniculitis affecting regions of neck and back, sacral and sacrococcygeal region: Secondary | ICD-10-CM | POA: Diagnosis not present

## 2017-12-07 DIAGNOSIS — M9903 Segmental and somatic dysfunction of lumbar region: Secondary | ICD-10-CM | POA: Diagnosis not present

## 2017-12-07 DIAGNOSIS — M62838 Other muscle spasm: Secondary | ICD-10-CM | POA: Diagnosis not present

## 2017-12-07 DIAGNOSIS — M9905 Segmental and somatic dysfunction of pelvic region: Secondary | ICD-10-CM | POA: Diagnosis not present

## 2017-12-17 ENCOUNTER — Encounter (HOSPITAL_COMMUNITY): Payer: Self-pay | Admitting: Psychiatry

## 2017-12-19 ENCOUNTER — Ambulatory Visit (HOSPITAL_COMMUNITY): Payer: Self-pay | Admitting: Psychology

## 2017-12-25 ENCOUNTER — Ambulatory Visit (HOSPITAL_COMMUNITY): Payer: Self-pay | Admitting: Psychiatry

## 2018-01-03 DIAGNOSIS — Z131 Encounter for screening for diabetes mellitus: Secondary | ICD-10-CM | POA: Diagnosis not present

## 2018-01-07 DIAGNOSIS — M150 Primary generalized (osteo)arthritis: Secondary | ICD-10-CM | POA: Diagnosis not present

## 2018-01-07 DIAGNOSIS — E785 Hyperlipidemia, unspecified: Secondary | ICD-10-CM | POA: Diagnosis not present

## 2018-01-07 DIAGNOSIS — E1165 Type 2 diabetes mellitus with hyperglycemia: Secondary | ICD-10-CM | POA: Diagnosis not present

## 2018-01-07 DIAGNOSIS — R42 Dizziness and giddiness: Secondary | ICD-10-CM | POA: Diagnosis not present

## 2018-01-09 DIAGNOSIS — M5136 Other intervertebral disc degeneration, lumbar region: Secondary | ICD-10-CM | POA: Diagnosis not present

## 2018-01-09 DIAGNOSIS — M3500 Sicca syndrome, unspecified: Secondary | ICD-10-CM | POA: Diagnosis not present

## 2018-01-09 DIAGNOSIS — M549 Dorsalgia, unspecified: Secondary | ICD-10-CM | POA: Diagnosis not present

## 2018-01-09 DIAGNOSIS — M1990 Unspecified osteoarthritis, unspecified site: Secondary | ICD-10-CM | POA: Diagnosis not present

## 2018-01-09 DIAGNOSIS — L819 Disorder of pigmentation, unspecified: Secondary | ICD-10-CM | POA: Diagnosis not present

## 2018-01-10 DIAGNOSIS — Z6841 Body Mass Index (BMI) 40.0 and over, adult: Secondary | ICD-10-CM | POA: Diagnosis not present

## 2018-01-10 DIAGNOSIS — M5408 Panniculitis affecting regions of neck and back, sacral and sacrococcygeal region: Secondary | ICD-10-CM | POA: Diagnosis not present

## 2018-01-10 DIAGNOSIS — Z713 Dietary counseling and surveillance: Secondary | ICD-10-CM | POA: Diagnosis not present

## 2018-01-10 DIAGNOSIS — E669 Obesity, unspecified: Secondary | ICD-10-CM | POA: Diagnosis not present

## 2018-01-10 DIAGNOSIS — M9905 Segmental and somatic dysfunction of pelvic region: Secondary | ICD-10-CM | POA: Diagnosis not present

## 2018-01-10 DIAGNOSIS — M9903 Segmental and somatic dysfunction of lumbar region: Secondary | ICD-10-CM | POA: Diagnosis not present

## 2018-01-10 DIAGNOSIS — M62838 Other muscle spasm: Secondary | ICD-10-CM | POA: Diagnosis not present

## 2018-01-15 DIAGNOSIS — R197 Diarrhea, unspecified: Secondary | ICD-10-CM | POA: Diagnosis not present

## 2018-01-15 DIAGNOSIS — M3501 Sicca syndrome with keratoconjunctivitis: Secondary | ICD-10-CM | POA: Diagnosis not present

## 2018-01-15 DIAGNOSIS — Z23 Encounter for immunization: Secondary | ICD-10-CM | POA: Diagnosis not present

## 2018-01-15 DIAGNOSIS — Z0000 Encounter for general adult medical examination without abnormal findings: Secondary | ICD-10-CM | POA: Diagnosis not present

## 2018-01-15 DIAGNOSIS — E1165 Type 2 diabetes mellitus with hyperglycemia: Secondary | ICD-10-CM | POA: Diagnosis not present

## 2018-01-15 DIAGNOSIS — R42 Dizziness and giddiness: Secondary | ICD-10-CM | POA: Diagnosis not present

## 2018-01-25 ENCOUNTER — Encounter: Payer: Self-pay | Admitting: Gastroenterology

## 2018-01-25 DIAGNOSIS — M1A00X0 Idiopathic chronic gout, unspecified site, without tophus (tophi): Secondary | ICD-10-CM | POA: Diagnosis not present

## 2018-02-06 ENCOUNTER — Ambulatory Visit (HOSPITAL_COMMUNITY): Payer: Federal, State, Local not specified - PPO | Admitting: Psychology

## 2018-02-06 ENCOUNTER — Encounter (HOSPITAL_COMMUNITY): Payer: Self-pay | Admitting: Psychology

## 2018-02-06 ENCOUNTER — Encounter

## 2018-02-06 DIAGNOSIS — F411 Generalized anxiety disorder: Secondary | ICD-10-CM

## 2018-02-06 DIAGNOSIS — F3342 Major depressive disorder, recurrent, in full remission: Secondary | ICD-10-CM | POA: Diagnosis not present
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
## 2018-02-07 DIAGNOSIS — M62838 Other muscle spasm: Secondary | ICD-10-CM | POA: Diagnosis not present

## 2018-02-07 DIAGNOSIS — M9903 Segmental and somatic dysfunction of lumbar region: Secondary | ICD-10-CM | POA: Diagnosis not present

## 2018-02-07 DIAGNOSIS — M2560 Stiffness of unspecified joint, not elsewhere classified: Secondary | ICD-10-CM | POA: Diagnosis not present

## 2018-02-07 DIAGNOSIS — M9906 Segmental and somatic dysfunction of lower extremity: Secondary | ICD-10-CM | POA: Diagnosis not present

## 2018-02-11 DIAGNOSIS — F329 Major depressive disorder, single episode, unspecified: Secondary | ICD-10-CM | POA: Diagnosis not present

## 2018-03-06 ENCOUNTER — Encounter: Payer: Self-pay | Admitting: Emergency Medicine

## 2018-03-06 NOTE — Progress Notes (Signed)
Referring Provider: Georgianne Fick, MD Primary Care Physician:  Georgianne Fick, MD   Reason for Consultation: History of colon polyps, diarrhea  CC: I need to have a colonoscopy before my bariatric surgery   IMPRESSION:  Intermittent diarrhea x years    - biopsies for microscopic colitis negative 2015    - IBS diagnosis by prior gastroenterologist Planning for bariatric sleeve gastrectomy  Diverticulosis  Internal hemorrhoids Hiatal hernia on EGD 2015 BMI 41.5 Normal screening colonoscopy 2015 in [place], [place] No first degree family history of colon cancer or polyps  Rebecca Hahn presented today with the expectation that she needed a colonoscopy prior to her bariatric surgery.  On review of her records it appears that her screening is up-to-date.  She had a normal and complete colonoscopy in 2015 in [place], [place]. This was her first and only colonoscopy. No polyps were identified at that time.  There is no family history of colon cancer or polyps in first degree family members. No new GI symptoms.  Therefore, she is not due another colonoscopy until 2025 unless she develops new GI symptoms in the meantime.  We discussed her chronic, intermittent diarrhea. There are no alarm features. She declined additional evaluation or prescription management for diarrhea because of the sporadic nature of the symptoms.   PLAN: Daily bulking agent recommended such as Metamucil or Citracel Colonoscopy in 2025 (she will be entered in recall for a reminder to schedule her colonoscopy at that time) Return to this office PRN    HPI: Rebecca Hahn is a 62 y.o. female with a history of recurrent headaches, DM not requiring insulin, Sjogren's syndrome with keratoconjunctivitis sicca, gout, ostearthritis involving multiple joints, chronic low back pain, obstructive sleep apnea, and morbid obesity with a BMI of 41.8. She has recently moved from [place] would like to establish care.  The history  is obtained through the patient, referral records provided by Dr. Nicholos Johns and review of her electronic health record.  She is preparing for bariatric surgery through the [facility]. She will have it performed in [place], [place].  Her need for a colonoscopy in preparation for her surgery prompted her to make this appointment, not new or worrisome GI symptoms..   She recently reported a 6 month history of intermittent diarrhea and a "sour stomach" to Dr. Nicholos Johns. A stool pathogen profile was negative. However, this intermittent has actually been going on for >10 years.   I reviewed records from Dr. Laurence Aly her gastroenterologist at [facility] gastroenterology in [place].  In 2015 she was seen for gastroesophageal reflux disease, 2-3 months of watery diarrhea with fecal incontinence, and 2-3 months of daily nausea. Her records note a history of anemia and irritable bowel syndrome.  She was using diclofenac at the time.  A colonoscopy with random biopsies 08/13/2013 showed diverticulosis and moderate internal hemorrhoids no polyps were seen.  Both colon and terminal ileum biopsies were normal.  There is no evidence for microscopic colitis. No surveillance recommendations are available in the records from her gastroenterologist. A concurrent EGD showed a moderate sized diaphragmatic hernia and mild gastritis.  Gastric biopsies showed oxyntic mucosa with focal adenomatous change, chronic gastritis, and no h pylori. The records note a history of adenoma of the large bowel but no further details are provided.  [name] can only remember one colonoscopy.   She has intermittent diarrhea, described as watery, explosive stools with urgency that will last 24 hours. Will occur every few months.  Unable to identify  triggers or medications. No other associated symptoms. No identified exacerbating or relieving features. No blood or mucous. No alarm features. Previously identified as IBS. She does not  feel that the symptoms occur frequently enough to warrant further investigation or treatment.  She has had no new GI symptoms develop since the time of her endoscopy in 2015.  Referral labs 06/29/17: normal CMP  Past Medical History:  Diagnosis Date  . Anxiety   . Depression   . Diabetes mellitus, type II (HCC)   . Sjoegren syndrome   . Sleep apnea   . Vertigo     Past Surgical History:  Procedure Laterality Date  . CESAREAN SECTION    . CHOLECYSTECTOMY    . TUBAL LIGATION      Prior to Admission medications   Medication Sig Start Date End Date Taking? Authorizing Provider  albuterol (PROVENTIL HFA;VENTOLIN HFA) 108 (90 Base) MCG/ACT inhaler Inhale 1-2 puffs into the lungs every 6 (six) hours as needed for wheezing or shortness of breath. 05/18/17   Lurene Shadow, PA-C  allopurinol (ZYLOPRIM) 100 MG tablet Take 200 mg by mouth daily.  12/31/16   [provider]  aspirin 81 MG chewable tablet Chew 81 mg by mouth daily.    [provider]  B Complex-C (SUPER B COMPLEX PO) Take by mouth.    [provider]  calcium-vitamin D (OSCAL WITH D) 250-125 MG-UNIT tablet Take 1 tablet by mouth daily.    [provider]  Cholecalciferol (VITAMIN D-3) 5000 units TABS Take by mouth.    [provider]  clonazePAM (KLONOPIN) 0.5 MG tablet Take 1 tablet (0.5 mg total) by mouth 2 (two) times daily. 06/29/17   Eksir, Bo Mcclintock, MD  Cyanocobalamin (VITAMIN B-12 SL) Place under the tongue.    [provider]  cycloSPORINE (RESTASIS) 0.05 % ophthalmic emulsion 1 drop 2 (two) times daily.    [provider]  Diclofenac Sodium CR 100 MG 24 hr tablet Take 100 mg by mouth daily. 12/20/16   [provider]  doxycycline (VIBRAMYCIN) 100 MG capsule Take 1 capsule (100 mg total) by mouth 2 (two) times daily. One po bid x 7 days 05/18/17   Lurene Shadow, PA-C  gabapentin (NEURONTIN) 100 MG capsule Take 100 mg by mouth 3 (three) times daily  as needed. 01/01/17   [provider]  hydroxychloroquine (PLAQUENIL) 200 MG tablet Take 200 mg by mouth daily. 01/01/17   [provider]  IRON PO Take by mouth.    [provider]  meclizine (ANTIVERT) 12.5 MG tablet Take 12.5 mg by mouth 3 (three) times daily as needed for dizziness.    [provider]  metFORMIN (GLUCOPHAGE) 500 MG tablet Take 500 mg by mouth daily with breakfast.    [provider]  pilocarpine (SALAGEN) 5 MG tablet Take 5 mg by mouth 3 (three) times daily as needed.  12/01/16   [provider]  predniSONE (DELTASONE) 20 MG tablet 3 tabs po day one, then 2 po daily x 4 days 05/18/17   Lurene Shadow, PA-C  rosuvastatin (CRESTOR) 20 MG tablet Take 20 mg by mouth daily.    [provider]  sertraline (ZOLOFT) 100 MG tablet Take 1 tablet (100 mg total) by mouth at bedtime. 10/23/17   Burnard Leigh, MD  traZODone (DESYREL) 100 MG tablet Take 1 tablet (100 mg total) by mouth at bedtime. 06/29/17   Burnard Leigh, MD  VITAMIN B1-B12 PO Take by  mouth.    [provider]    Current Outpatient Medications  Medication Sig Dispense Refill  . albuterol (PROVENTIL HFA;VENTOLIN HFA) 108 (90 Base) MCG/ACT inhaler Inhale 1-2 puffs into the lungs every 6 (six) hours as needed for wheezing or shortness of breath. 1 Inhaler 0  . allopurinol (ZYLOPRIM) 100 MG tablet Take 200 mg by mouth daily.     Marland Kitchen aspirin 81 MG chewable tablet Chew 81 mg by mouth daily.    . B Complex-C (SUPER B COMPLEX PO) Take by mouth.    . calcium-vitamin D (OSCAL WITH D) 250-125 MG-UNIT tablet Take 1 tablet by mouth daily.    . Cholecalciferol (VITAMIN D-3) 5000 units TABS Take by mouth.    . clonazePAM (KLONOPIN) 0.5 MG tablet Take 1 tablet (0.5 mg total) by mouth 2 (two) times daily. 180 tablet 1  . Cyanocobalamin (VITAMIN B-12 SL) Place under the tongue.    . cycloSPORINE (RESTASIS) 0.05 % ophthalmic emulsion 1 drop 2 (two) times daily.     . Diclofenac Sodium CR 100 MG 24 hr tablet Take 100 mg by mouth daily.    Marland Kitchen doxycycline (VIBRAMYCIN) 100 MG capsule Take 1 capsule (100 mg total) by mouth 2 (two) times daily. One po bid x 7 days 14 capsule 0  . gabapentin (NEURONTIN) 100 MG capsule Take 100 mg by mouth 3 (three) times daily as needed.    . hydroxychloroquine (PLAQUENIL) 200 MG tablet Take 200 mg by mouth daily.    . IRON PO Take by mouth.    . meclizine (ANTIVERT) 12.5 MG tablet Take 12.5 mg by mouth 3 (three) times daily as needed for dizziness.    . metFORMIN (GLUCOPHAGE) 500 MG tablet Take 500 mg by mouth daily with breakfast.    . pilocarpine (SALAGEN) 5 MG tablet Take 5 mg by mouth 3 (three) times daily as needed.     . predniSONE (DELTASONE) 20 MG tablet 3 tabs po day one, then 2 po daily x 4 days 11 tablet 0  . rosuvastatin (CRESTOR) 20 MG tablet Take 20 mg by mouth daily.    . sertraline (ZOLOFT) 100 MG tablet Take 1 tablet (100 mg total) by mouth at bedtime. 90 tablet 1  . traZODone (DESYREL) 100 MG tablet Take 1 tablet (100 mg total) by mouth at bedtime. 90 tablet 1  . VITAMIN B1-B12 PO Take by mouth.     No current facility-administered medications for this visit.     Allergies as of 03/07/2018 - Review Complete 10/23/2017  Allergen Reaction Noted  . Penicillins Rash 01/27/2017    Family History  Problem Relation Age of Onset  . Depression Maternal Aunt     Social History   Socioeconomic History  . Marital status: Single    Spouse name: Not on file  . Number of children: Not on file  . Years of education: Not on file  . Highest education level: Not on file  Occupational History  . Not on file  Social Needs  . Financial resource strain: Not on file  . Food insecurity:    Worry: Not on file    Inability: Not on file  . Transportation needs:    Medical: Not on file    Non-medical: Not on file  Tobacco Use  . Smoking status: Former Games developer  . Smokeless tobacco: Never Used  . Tobacco comment:  in teen years  Substance and Sexual Activity  . Alcohol use: No  . Drug use: No  .  Sexual activity: Not on file  Lifestyle  . Physical activity:    Days per week: Not on file    Minutes per session: Not on file  . Stress: Not on file  Relationships  . Social connections:    Talks on phone: Not on file    Gets together: Not on file    Attends religious service: Not on file    Active member of club or organization: Not on file    Attends meetings of clubs or organizations: Not on file    Relationship status: Not on file  . Intimate partner violence:    Fear of current or ex partner: Not on file    Emotionally abused: Not on file    Physically abused: Not on file    Forced sexual activity: Not on file  Other Topics Concern  . Not on file  Social History Narrative  . Not on file    Review of Systems: 12 system ROS is negative except as noted above with the additions of: allergy, sinus trouble, anxiety, back pain, depression, fatigue, muscle pain, thirst, and urinary leakage.   Physical Exam: @VSRANGES @   General:   Alert, well-nourished, pleasant and cooperative in NAD Head:  Normocephalic and atraumatic. Eyes:  Sclera clear, no icterus.   Conjunctiva pink. Mouth:  No deformity or lesions.   Neck:  Supple; no thyromegaly. Lungs:  Clear throughout to auscultation.   No wheezes.  Heart:  Regular rate and rhythm; no murmurs Abdomen:  Soft, central obesity, nontender, normal bowel sounds. No rebound or guarding. No hepatosplenomegaly Rectal:  Deferred  Msk:  Symmetrical without gross deformities. Extremities:  No gross deformities or edema. Neurologic:  Alert and  oriented x4;  grossly nonfocal Skin:  No rash or bruise. Psych:  Alert and cooperative. Normal mood and affect.   Atom Solivan L. Orvan Falconer Md, MPH [facility] 03/06/2018, 11:39 AM

## 2018-03-07 ENCOUNTER — Ambulatory Visit: Payer: Federal, State, Local not specified - PPO | Admitting: Gastroenterology

## 2018-03-07 ENCOUNTER — Encounter: Payer: Self-pay | Admitting: Gastroenterology

## 2018-03-07 VITALS — BP 108/76 | HR 92 | Ht 66.25 in | Wt 259.0 lb

## 2018-03-07 DIAGNOSIS — R197 Diarrhea, unspecified: Secondary | ICD-10-CM | POA: Diagnosis not present

## 2018-03-07 NOTE — Patient Instructions (Signed)
Try a daily powder fiber such as Metamucil or Citrucel.   You will be due for a recall colonoscopy in 2025. We will send you a reminder in the mail when it gets closer to that time.

## 2018-03-21 ENCOUNTER — Ambulatory Visit (INDEPENDENT_AMBULATORY_CARE_PROVIDER_SITE_OTHER): Payer: Federal, State, Local not specified - PPO | Admitting: Psychology

## 2018-03-21 DIAGNOSIS — F3342 Major depressive disorder, recurrent, in full remission: Secondary | ICD-10-CM | POA: Diagnosis not present

## 2018-03-21 DIAGNOSIS — F411 Generalized anxiety disorder: Secondary | ICD-10-CM | POA: Diagnosis not present

## 2018-03-21 DIAGNOSIS — M62838 Other muscle spasm: Secondary | ICD-10-CM | POA: Diagnosis not present

## 2018-03-21 DIAGNOSIS — M9906 Segmental and somatic dysfunction of lower extremity: Secondary | ICD-10-CM | POA: Diagnosis not present

## 2018-03-21 DIAGNOSIS — M9903 Segmental and somatic dysfunction of lumbar region: Secondary | ICD-10-CM | POA: Diagnosis not present

## 2018-03-21 DIAGNOSIS — M2560 Stiffness of unspecified joint, not elsewhere classified: Secondary | ICD-10-CM | POA: Diagnosis not present

## 2018-03-21 NOTE — Progress Notes (Signed)
   THERAPIST PROGRESS NOTE  Session Time: 10.10am-10.57am  Participation Level: Active  Behavioral Response: Well GroomedAlertaffect wnl  Type of Therapy: Individual Therapy  Treatment Goals addressed: Diagnosis: MDD, GAD and goal 1.  Interventions: CBT and Supportive  Summary: Rebecca Hahn is a 63 y.o. female who presents with affect wnl.  Pt reported that some stressors recent w/ her daughter complaining that she doesn't have contact w/ her son who is placed in kinship care w/ pt exhusband.  Pt discussed how she is encouraging her to work w/ lawyer to have rules in place that are followed through w/ and was hurt for grandson when she stated she is focused on working to get daughter back at this time. Pt was able to acknowledge that she can't take on her daughter's problems and discussed how she is using her supports and use of prayer for coping.  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  Pt discussed how she enjoyed bu was worn out. Pt was able to identify that the 4 hours a week for 3 days a week was too much.  Pt discussed how she can change this to keep involved but also be aware of need for self care w/ her medical needs as well.   Suicidal/Homicidal: Nowithout intent/plan  Therapist Response: Assessed pt current functioning per pt report  Processed w/pt recent stressors and reflected pt positive keeping boundaries and not "taking on daughters problems".  Explored w/pt use of good coping skills and self care.  Discussed pt expectations w/ volunteering and expecting to much for self which in turn made it difficult to continue.  Explored w/pt ways to engage and find balance that best for her.   Plan: Return again in 3 weeks.  Diagnosis: GAd, MDD   Rebecca Hahn, [facility] 03/21/2018

## 2018-04-05 DIAGNOSIS — Z01812 Encounter for preprocedural laboratory examination: Secondary | ICD-10-CM | POA: Diagnosis not present

## 2018-04-08 ENCOUNTER — Ambulatory Visit (HOSPITAL_COMMUNITY): Payer: Self-pay | Admitting: Psychology

## 2018-04-16 DIAGNOSIS — H903 Sensorineural hearing loss, bilateral: Secondary | ICD-10-CM | POA: Diagnosis not present

## 2018-05-03 DIAGNOSIS — M9903 Segmental and somatic dysfunction of lumbar region: Secondary | ICD-10-CM | POA: Diagnosis not present

## 2018-05-03 DIAGNOSIS — M5386 Other specified dorsopathies, lumbar region: Secondary | ICD-10-CM | POA: Diagnosis not present

## 2018-05-03 DIAGNOSIS — M109 Gout, unspecified: Secondary | ICD-10-CM | POA: Diagnosis not present

## 2018-05-03 DIAGNOSIS — M62838 Other muscle spasm: Secondary | ICD-10-CM | POA: Diagnosis not present

## 2018-05-07 DIAGNOSIS — R42 Dizziness and giddiness: Secondary | ICD-10-CM | POA: Diagnosis not present

## 2018-05-08 ENCOUNTER — Encounter

## 2018-05-08 ENCOUNTER — Ambulatory Visit (HOSPITAL_COMMUNITY): Payer: Federal, State, Local not specified - PPO | Admitting: Psychology

## 2018-05-08 DIAGNOSIS — F411 Generalized anxiety disorder: Secondary | ICD-10-CM | POA: Diagnosis not present

## 2018-05-08 NOTE — Progress Notes (Signed)
   THERAPIST PROGRESS NOTE  Session Time: 10.05am-10.50am  Participation Level: Active  Behavioral Response: Well GroomedAlertaffect bright  Type of Therapy: Individual Therapy  Treatment Goals addressed: Diagnosis: GAD and goal 1.  Interventions: CBT and Supportive  Summary: Rebecca Hahn is a 62 y.o. female who presents with affect bright.  pt reported that she has been doing well.  Pt reported that she did miss last appointment because finances too tight.  Pt reported that her bankruptcy has gone through and seeing positive impact from that now and was able to keep possession she was worried might be reposed.  Pt reported that she enjoyed Thanksgiving and to have daughter together and hear them talk and work through things w/out intervening.  Pt reported that she still struggles w/ her energy level at times w/her medical issues but feels good about how she is managing and not judging self for.  Pt discussed what she is grateful for and next steps w/ attending senior center for 1x a week work outs. Pt discussed difficulty w/ feeling dizzy and has discussed w/ PCP and plans to talk w/ all her prescribing providers as a side effect of many of her medications.   Suicidal/Homicidal: Nowithout intent/plan  Therapist Response: Assessed pt current functioning per pt report. Processed w/ pt her interactions and medical stressors.  Reflected pt pt use of supports and coping skills effectively.  Assisted w/ making reframes as needed and identify other steps ready to take towards her engagement w/out overdoing.  Plan: Return again in 4 weeks.  Diagnosis: GAD  RebeccaLEANNE, [facility] 05/08/2018

## 2018-05-29 ENCOUNTER — Ambulatory Visit (HOSPITAL_COMMUNITY): Payer: Federal, State, Local not specified - PPO | Admitting: Psychology

## 2018-05-31 DIAGNOSIS — M1A00X0 Idiopathic chronic gout, unspecified site, without tophus (tophi): Secondary | ICD-10-CM | POA: Diagnosis not present

## 2018-06-12 ENCOUNTER — Ambulatory Visit (HOSPITAL_COMMUNITY): Payer: Federal, State, Local not specified - PPO | Admitting: Psychology

## 2018-06-12 DIAGNOSIS — F3342 Major depressive disorder, recurrent, in full remission: Secondary | ICD-10-CM | POA: Diagnosis not present

## 2018-06-12 DIAGNOSIS — F411 Generalized anxiety disorder: Secondary | ICD-10-CM | POA: Diagnosis not present

## 2018-06-12 NOTE — Progress Notes (Signed)
   THERAPIST PROGRESS NOTE  Session Time: 10.05am-10.43am  Participation Level: Active  Behavioral Response: Well GroomedAlertaffect brighty  Type of Therapy: Individual Therapy  Treatment Goals addressed: Diagnosis: GAD, MDD and goal 1.  Interventions: CBT and Supportive  Summary: Rebecca Hahn is a 63 y.o. female who presents with affect bright.  pt reported that her Christmas was great- visited her children in [place] and reported that things feel good among them and interactions.  Pt reported that her daughter who was living w/ her moved out recently and she is an empty nester again.  Pt recognizes that she is adjusting to this although feels was positive move for daughter. Pt is focused on taking care of herself and engaging in activities in community.  Pt reported she has started attending her church services- but will take slow on what she gets involved in so doesn't get overwhelmed.  Pt also reports she has made steps towards her goal of the senior water exercise class- bought her gear and plans to start at the next monthly session.  Pt reported that the local community center has been great for connected and tapping into resources for her age and a lot free.   Suicidal/Homicidal: Nowithout intent/plan  Therapist Response: Assessed pt current functioning per pt report. Processed w/pt coping w/ transition of empty nest and importance of staying connecting and engaging w/ others.  Reflected pt positive steps and awareness to make transitions slow to not get burned out or overwhelmed.   Plan: Return again in 1 month.  Diagnosis: GAD, MDD in remission   YATES,LEANNE, [facility] 06/12/2018

## 2018-06-18 DIAGNOSIS — G4733 Obstructive sleep apnea (adult) (pediatric): Secondary | ICD-10-CM | POA: Diagnosis not present

## 2018-06-20 DIAGNOSIS — M5386 Other specified dorsopathies, lumbar region: Secondary | ICD-10-CM | POA: Diagnosis not present

## 2018-06-20 DIAGNOSIS — M62838 Other muscle spasm: Secondary | ICD-10-CM | POA: Diagnosis not present

## 2018-06-20 DIAGNOSIS — M9906 Segmental and somatic dysfunction of lower extremity: Secondary | ICD-10-CM | POA: Diagnosis not present

## 2018-06-20 DIAGNOSIS — M9903 Segmental and somatic dysfunction of lumbar region: Secondary | ICD-10-CM | POA: Diagnosis not present

## 2018-06-24 DIAGNOSIS — J069 Acute upper respiratory infection, unspecified: Secondary | ICD-10-CM | POA: Diagnosis not present

## 2018-06-24 DIAGNOSIS — E1165 Type 2 diabetes mellitus with hyperglycemia: Secondary | ICD-10-CM | POA: Diagnosis not present

## 2018-06-26 ENCOUNTER — Ambulatory Visit (HOSPITAL_COMMUNITY): Payer: Federal, State, Local not specified - PPO | Admitting: Psychology

## 2018-07-10 ENCOUNTER — Ambulatory Visit (INDEPENDENT_AMBULATORY_CARE_PROVIDER_SITE_OTHER): Payer: Federal, State, Local not specified - PPO | Admitting: Psychology

## 2018-07-10 DIAGNOSIS — F411 Generalized anxiety disorder: Secondary | ICD-10-CM

## 2018-07-10 DIAGNOSIS — F330 Major depressive disorder, recurrent, mild: Secondary | ICD-10-CM

## 2018-07-10 NOTE — Progress Notes (Signed)
   THERAPIST PROGRESS NOTE  Session Time: 10.08am-10.58am  Participation Level: Active  Behavioral Response: Well GroomedAlertaffect bright  Type of Therapy: Individual Therapy  Treatment Goals addressed: Diagnosis: GAD, MDD and goal 1.  Interventions: CBT and Supportive  Summary: Rebecca Hahn is a 63 y.o. female who presents with affect bright.  pt reported that she did experience depressed mood that lasted for almost  2 weeks recent.  Pt reported that she felt led by her faith and clarity to stop punishing herself.  Pt reported that this was so helpful and now feels good about decisions she is making for self and ways she is connecting w/ her resources and not beating self up for what she can and cannot do.  Pt reported did have some financial struggle  Past month and having to rebudget moving forward. Pt reported that interactions w/ family have continued to be positive and keeping good open communication.  Pt reports that she has registered for water aerobics class at senior center and will start next week.  Suicidal/Homicidal: Nowithout intent/plan  Therapist Response: Assessed tp current functioning per pt report. Processed w/pt her clarity and able to reframe and acknowledge negative self talk.  Discussed actions being consistent and continuing ot reframe w/ any negative self talk that may resurface.   Plan: Return again in 3-4 weeks.  Diagnosis: GAD, MDD   Rebecca Hahn, [facility] 07/10/2018

## 2018-07-11 DIAGNOSIS — L819 Disorder of pigmentation, unspecified: Secondary | ICD-10-CM | POA: Diagnosis not present

## 2018-07-11 DIAGNOSIS — M3500 Sicca syndrome, unspecified: Secondary | ICD-10-CM | POA: Diagnosis not present

## 2018-07-11 DIAGNOSIS — L659 Nonscarring hair loss, unspecified: Secondary | ICD-10-CM | POA: Diagnosis not present

## 2018-07-11 DIAGNOSIS — M1990 Unspecified osteoarthritis, unspecified site: Secondary | ICD-10-CM | POA: Diagnosis not present

## 2018-07-15 DIAGNOSIS — M9906 Segmental and somatic dysfunction of lower extremity: Secondary | ICD-10-CM | POA: Diagnosis not present

## 2018-07-15 DIAGNOSIS — M9903 Segmental and somatic dysfunction of lumbar region: Secondary | ICD-10-CM | POA: Diagnosis not present

## 2018-07-15 DIAGNOSIS — M62838 Other muscle spasm: Secondary | ICD-10-CM | POA: Diagnosis not present

## 2018-07-15 DIAGNOSIS — M5386 Other specified dorsopathies, lumbar region: Secondary | ICD-10-CM | POA: Diagnosis not present

## 2018-07-18 DIAGNOSIS — M150 Primary generalized (osteo)arthritis: Secondary | ICD-10-CM | POA: Diagnosis not present

## 2018-07-18 DIAGNOSIS — E1165 Type 2 diabetes mellitus with hyperglycemia: Secondary | ICD-10-CM | POA: Diagnosis not present

## 2018-07-25 DIAGNOSIS — E1165 Type 2 diabetes mellitus with hyperglycemia: Secondary | ICD-10-CM | POA: Diagnosis not present

## 2018-07-25 DIAGNOSIS — M150 Primary generalized (osteo)arthritis: Secondary | ICD-10-CM | POA: Diagnosis not present

## 2018-07-25 DIAGNOSIS — R42 Dizziness and giddiness: Secondary | ICD-10-CM | POA: Diagnosis not present

## 2018-07-25 DIAGNOSIS — M3501 Sicca syndrome with keratoconjunctivitis: Secondary | ICD-10-CM | POA: Diagnosis not present

## 2018-08-07 ENCOUNTER — Ambulatory Visit (INDEPENDENT_AMBULATORY_CARE_PROVIDER_SITE_OTHER): Payer: Non-veteran care | Admitting: Psychology

## 2018-08-07 ENCOUNTER — Other Ambulatory Visit: Payer: Self-pay

## 2018-08-07 DIAGNOSIS — F411 Generalized anxiety disorder: Secondary | ICD-10-CM

## 2018-08-07 DIAGNOSIS — F330 Major depressive disorder, recurrent, mild: Secondary | ICD-10-CM | POA: Diagnosis not present

## 2018-08-07 NOTE — Progress Notes (Signed)
   THERAPIST PROGRESS NOTE  Session Time: 9.55am-10.35am  Participation Level: Active  Behavioral Response: Well GroomedAlertaffect bright  Type of Therapy: Individual Therapy  Treatment Goals addressed: Diagnosis: GAD, MDD and goal 1.  Interventions: CBT and Supportive  Summary: Rebecca Hahn is a 63 y.o. female who presents with affect bright.  pt reported that she is doing well as already set up for staying at home and connecting w/ her friends through phone, etc. Pt did report only thing messed up is that was going to start at senior center for exercise.  Pt reported that she has been able to be a support to her daughter and daughters also assisting her.  Pt reported that she has been struggling w/ some negative self talk and doubt after visit w/ mom and when talks to sister. Both sister and brother are ministers and pt has questioned if she would be in the same place if had stayed home and didn't go in to [employer].  Pt is able to reframe this for self that was right for her at the time.  Pt discussed that she still fights ideas of sister better as a Optician, dispensing and she is not.  Pt is able to see as distortion and reframe that she is no better and that she is doing her work in ways she has been called to..   Suicidal/Homicidal: Nowithout intent/plan  Therapist Response: ASsessed pt current functioning per pt report. Processed w/pt adjusting to Covid 19 and social distancing.  Explored w/pt her thoughts when talking w/ sister and assisted in identifying distortions reframing and identifying positive self talk to reiterated to self.   Plan: Return again in 4 weeks.  Diagnosis: GAD, MDD [name], [facility] 08/07/2018

## 2018-08-12 DIAGNOSIS — M9903 Segmental and somatic dysfunction of lumbar region: Secondary | ICD-10-CM | POA: Diagnosis not present

## 2018-08-12 DIAGNOSIS — M62838 Other muscle spasm: Secondary | ICD-10-CM | POA: Diagnosis not present

## 2018-08-12 DIAGNOSIS — M5386 Other specified dorsopathies, lumbar region: Secondary | ICD-10-CM | POA: Diagnosis not present

## 2018-08-12 DIAGNOSIS — M9906 Segmental and somatic dysfunction of lower extremity: Secondary | ICD-10-CM | POA: Diagnosis not present

## 2018-08-13 DIAGNOSIS — R21 Rash and other nonspecific skin eruption: Secondary | ICD-10-CM | POA: Diagnosis not present

## 2018-08-13 DIAGNOSIS — N390 Urinary tract infection, site not specified: Secondary | ICD-10-CM | POA: Diagnosis not present

## 2018-08-13 DIAGNOSIS — R825 Elevated urine levels of drugs, medicaments and biological substances: Secondary | ICD-10-CM | POA: Diagnosis not present

## 2018-08-20 DIAGNOSIS — M1A00X0 Idiopathic chronic gout, unspecified site, without tophus (tophi): Secondary | ICD-10-CM | POA: Diagnosis not present

## 2018-09-03 DIAGNOSIS — K219 Gastro-esophageal reflux disease without esophagitis: Secondary | ICD-10-CM | POA: Diagnosis not present

## 2018-09-03 DIAGNOSIS — R51 Headache: Secondary | ICD-10-CM | POA: Diagnosis not present

## 2018-09-04 ENCOUNTER — Ambulatory Visit (INDEPENDENT_AMBULATORY_CARE_PROVIDER_SITE_OTHER): Payer: Federal, State, Local not specified - PPO | Admitting: Psychology

## 2018-09-04 ENCOUNTER — Other Ambulatory Visit: Payer: Self-pay

## 2018-09-04 DIAGNOSIS — F330 Major depressive disorder, recurrent, mild: Secondary | ICD-10-CM | POA: Diagnosis not present

## 2018-09-04 DIAGNOSIS — F411 Generalized anxiety disorder: Secondary | ICD-10-CM

## 2018-09-04 NOTE — Progress Notes (Signed)
Virtual Visit via Video Note  I connected with Rebecca Hahn on 09/04/18 at 10:00 AM EDT by a video enabled telemedicine application and verified that I am speaking with the correct person using two identifiers.   I discussed the limitations of evaluation and management by telemedicine and the availability of in person appointments. The patient expressed understanding and agreed to proceed.     I provided 46 minutes of non-face-to-face time during this encounter.   [name] [facility]    THERAPIST PROGRESS NOTE  Session Time: 10am-10.46am  Participation Level: Active  Behavioral Response: Well GroomedAlertaffect bright  Type of Therapy: Individual Therapy  Treatment Goals addressed: Diagnosis: GAD, MDD and goal 1.  Interventions: CBT and Supportive  Summary: Rebecca Hahn is a 63 y.o. female who presents with affect bright.  Pt reports she has been feeling giddy as she has met someone and definitely feeling a crush.  Pt reported met through facebook and have been chatting since for past 2 weeks.  Pt reported that she is being cautious however as aware that doesn't really know this person.  Pt reported that she is liking what she hears but aware that he is expressing of feelings of love very quickly and to take her time.  Pt reported that she is talking w/ sister and daughter about as well and wants to further get to know w/ deeper conversations.  Pt reported that her health has been good and she [facility] and she really liked this new provider.  Pt discussed how her health problems are under good control and she is still considering gastric bypass surgery.  Pt also reported she is doing well managing her limited finances.   Suicidal/Homicidal: Nowithout intent/plan  Therapist Response: Assessed pt current functioning per pt report.  Processed w/ pt new relationship and encouraged pt to take slowly as to get to further know as well as being able to talk w/ her supports  about. Discussed things that would be potential red flags.  Reiterated even while exploring this new relationship- still attending to the goals she is setting for herself.  Plan: Return again in 2 weeks, via webex.I discussed the assessment and treatment plan with the patient. The patient was provided an opportunity to ask questions and all were answered. The patient agreed with the plan and demonstrated an understanding of the instructions.   The patient was advised to call back or seek an in-person evaluation if the symptoms worsen or if the condition fails to improve as anticipated.  Diagnosis: GAD, MDD- mild   [name], [facility] 09/04/2018

## 2018-09-12 DIAGNOSIS — M9903 Segmental and somatic dysfunction of lumbar region: Secondary | ICD-10-CM | POA: Diagnosis not present

## 2018-09-12 DIAGNOSIS — M9906 Segmental and somatic dysfunction of lower extremity: Secondary | ICD-10-CM | POA: Diagnosis not present

## 2018-09-12 DIAGNOSIS — M62838 Other muscle spasm: Secondary | ICD-10-CM | POA: Diagnosis not present

## 2018-09-12 DIAGNOSIS — M5386 Other specified dorsopathies, lumbar region: Secondary | ICD-10-CM | POA: Diagnosis not present

## 2018-09-18 ENCOUNTER — Ambulatory Visit (INDEPENDENT_AMBULATORY_CARE_PROVIDER_SITE_OTHER): Payer: Federal, State, Local not specified - PPO | Admitting: Psychology

## 2018-09-18 ENCOUNTER — Other Ambulatory Visit: Payer: Self-pay

## 2018-09-18 DIAGNOSIS — F330 Major depressive disorder, recurrent, mild: Secondary | ICD-10-CM

## 2018-09-18 DIAGNOSIS — F411 Generalized anxiety disorder: Secondary | ICD-10-CM | POA: Diagnosis not present

## 2018-09-18 NOTE — Progress Notes (Signed)
  Virtual Visit via Telephone Note  I connected with Rebecca Hahn on 09/18/18 at 11:00 AM EDT by telephone and verified that I am speaking with the correct person using two identifiers.   I discussed the limitations, risks, security and privacy concerns of performing an evaluation and management service by telephone and the availability of in person appointments. I also discussed with the patient that there may be a patient responsible charge related to this service. The patient expressed understanding and agreed to proceed.   I provided 43 minutes of non-face-to-face time during this encounter.   [name] [facility]   THERAPIST PROGRESS NOTE  Session Time: 11.00am-11.43am  Participation Level: Active  Behavioral Response: Well GroomedAlertaffect bright  Type of Therapy: Individual Therapy  Treatment Goals addressed: Diagnosis: MDD, GAD and goal 1  Interventions: CBT and Strength-based  Summary: Rebecca Hahn is a 63 y.o. female who presents with affect bright.  Pt reported that she is doing well and has continued to talk w/ man she met over internet.  Pt reported she was initially upset as her support system felt she shouldn't proceed as too many red flags.  Pt reported that a red flag for self when an international number called.  pt reported that she gave further thought and decided that she felt something there was genuine that she had to explored.  Pt reported that she is aware that her family/friend are concerned but that she wants to make for decision for self.  Pt reports she will keep informed her support group.  Pt reported mood has been good.  Pt reported that she has [facility] consult for her surgery set up in may.     Suicidal/Homicidal: Nowithout intent/plan  Therapist Response: Assessed pt current functioning per pt report.  Processed w/pt coping w/ families options and her own thoughts and feelings re: new relationship.  Encouraged pt that if proceed, to w/ caution and keep  connected w/ her supports- keeping them informed.    Plan: Return aI discussed the assessment and treatment plan with the patient. The patient was provided an opportunity to ask questions and all were answered. The patient agreed with the plan and demonstrated an understanding of the instructions.   The patient was advised to call back or seek an in-person evaluation if the symptoms worsen or if the condition fails to improve as anticipated.gain in 2 weeks, via webex.  I discussed the assessment and treatment plan with the patient. The patient was provided an opportunity to ask questions and all were answered. The patient agreed with the plan and demonstrated an understanding of the instructions.   The patient was advised to call back or seek an in-person evaluation if the symptoms worsen or if the condition fails to improve as anticipated.  Diagnosis: MDD, GAD   [name] [facility] 09/18/2018

## 2018-09-25 DIAGNOSIS — G4733 Obstructive sleep apnea (adult) (pediatric): Secondary | ICD-10-CM | POA: Diagnosis not present

## 2018-10-01 DIAGNOSIS — G4733 Obstructive sleep apnea (adult) (pediatric): Secondary | ICD-10-CM | POA: Diagnosis not present

## 2018-10-02 ENCOUNTER — Other Ambulatory Visit: Payer: Self-pay

## 2018-10-02 ENCOUNTER — Ambulatory Visit (INDEPENDENT_AMBULATORY_CARE_PROVIDER_SITE_OTHER): Payer: Federal, State, Local not specified - PPO | Admitting: Psychology

## 2018-10-02 DIAGNOSIS — F330 Major depressive disorder, recurrent, mild: Secondary | ICD-10-CM | POA: Diagnosis not present

## 2018-10-02 DIAGNOSIS — F411 Generalized anxiety disorder: Secondary | ICD-10-CM

## 2018-10-02 NOTE — Progress Notes (Signed)
Virtual Visit via Video Note  I connected with [name] on 10/02/18 at  9:00 AM EDT by a video enabled telemedicine application and verified that I am speaking with the correct person using two identifiers.   I discussed the limitations of evaluation and management by telemedicine and the availability of in person appointments. The patient expressed understanding and agreed to proceed.   I provided 46 minutes of non-face-to-face time during this encounter.   Forde Radon [facility]    THERAPIST PROGRESS NOTE  Session Time: 9.01am-9.47am  Participation Level: Active  Behavioral Response: Well GroomedAlertaffect bright  Type of Therapy: Individual Therapy  Treatment Goals addressed: Diagnosis: GAD, MDD and goal 1.  Interventions: CBT and Strength-based  Summary: Rebecca Hahn is a 63 y.o. female who presents with affect bright.  Pt reported that the relationship online was a scam- beginning on Sunday began to ask for money and was demanding and angry when said no.  pt reported that she ended up blocking and logging out of social media communicating through.  Pt reported that was nice to have the flattering conversation- but feels good that she was able to have the wisdom and see the scam.  Pt also acknowledge good support she had from family and friends.  Pt reports she isn't being sad about- is focused on seeing this as growth and reframing to see her strength.  Pt reflected on her marriage and how she didn't have the respect and not treated w/ dignity.  Pt identified what she would want in a relationship and will keep writing about.  Pt also agreed that connecting w/her church community would be positive step for her now.   Suicidal/Homicidal: Nowithout intent/plan  Therapist Response: Assessed pt current functioning per pt report. Processed w/pt her realizing the scam of online relationship.  Explored w/pt her thoughts and assisted in reframing and acknowledging her strength and how  to focus on growth and clarity as she moves forward.  discussed ways of connecting w/ supports through her community to keep from loneliness.  Plan: Return again in 2 weeks, via webex.  I discussed the assessment and treatment plan with the patient. The patient was provided an opportunity to ask questions and all were answered. The patient agreed with the plan and demonstrated an understanding of the instructions.   The patient was advised to call back or seek an in-person evaluation if the symptoms worsen or if the condition fails to improve as anticipated.  Diagnosis: MDD, GAD   Forde Radon [facility] 10/02/2018

## 2018-10-16 ENCOUNTER — Other Ambulatory Visit: Payer: Self-pay

## 2018-10-16 ENCOUNTER — Ambulatory Visit (INDEPENDENT_AMBULATORY_CARE_PROVIDER_SITE_OTHER): Payer: Federal, State, Local not specified - PPO | Admitting: Psychology

## 2018-10-16 DIAGNOSIS — F411 Generalized anxiety disorder: Secondary | ICD-10-CM

## 2018-10-16 DIAGNOSIS — F330 Major depressive disorder, recurrent, mild: Secondary | ICD-10-CM

## 2018-10-16 NOTE — Progress Notes (Signed)
Virtual Visit via Video Note  I connected with Rebecca Hahn on 10/16/18 at 11:00 AM EDT by a video enabled telemedicine application and verified that I am speaking with the correct person using two identifiers.   I discussed the limitations of evaluation and management by telemedicine and the availability of in person appointments. The patient expressed understanding and agreed to proceed.  I provided 54 minutes of non-face-to-face time during this encounter.   Forde Radon [facility]    THERAPIST PROGRESS NOTE  Session Time: 11am-11.54am  Participation Level: Active  Behavioral Response: Well GroomedAlertaffect bright  Type of Therapy: Individual Therapy  Treatment Goals addressed: Diagnosis: MDD and goal 1.  Interventions: CBT and Strength-based  Summary: Rebecca Hahn is a 63 y.o. female who presents with affect bright today.  Pt reported that she went through a slump and depressed mood set in for about 2 weeks- thinking about the scam relationship and loss for what wanted to be real. Pt reported that she maintained contact w/ her supports during this time so didn't withdraw- but did have loss of interest and low motivation to do anything.  Pt reported that she recognized need to change behavior and started yesterday and returned to hygiene and began writing about experience.  Pt shared writing that made reframes and expressed understanding from her experience- pt is still working on writing and mentions that might pull out old writings from previous life experiences and continue writing.  Pt reported on feeling support from family, friends and church.  Pt discussed continued steps for coping.  Suicidal/Homicidal: Nowithout intent/plan  Therapist Response: Assessed pt current functioning per pt report. Processed w/pt coping w/ depressed mood w/ loss of relationship and scam.  Explored w/pt her writings and benefit from to process and reframe.  discussed pt supports and pt coping  skills- reiterated continued use of these strengths.   Plan: Return again in 2 weeks, via webex. I discussed the assessment and treatment plan with the patient. The patient was provided an opportunity to ask questions and all were answered. The patient agreed with the plan and demonstrated an understanding of the instructions.   The patient was advised to call back or seek an in-person evaluation if the symptoms worsen or if the condition fails to improve as anticipated. Diagnosis: MDD   Forde Radon [facility] 10/16/2018

## 2018-10-29 ENCOUNTER — Ambulatory Visit (HOSPITAL_COMMUNITY): Payer: Federal, State, Local not specified - PPO | Admitting: Psychology

## 2018-11-15 DIAGNOSIS — F332 Major depressive disorder, recurrent severe without psychotic features: Secondary | ICD-10-CM | POA: Diagnosis not present

## 2018-12-02 DIAGNOSIS — M9901 Segmental and somatic dysfunction of cervical region: Secondary | ICD-10-CM | POA: Diagnosis not present

## 2018-12-02 DIAGNOSIS — M5386 Other specified dorsopathies, lumbar region: Secondary | ICD-10-CM | POA: Diagnosis not present

## 2018-12-02 DIAGNOSIS — M9905 Segmental and somatic dysfunction of pelvic region: Secondary | ICD-10-CM | POA: Diagnosis not present

## 2018-12-02 DIAGNOSIS — M9903 Segmental and somatic dysfunction of lumbar region: Secondary | ICD-10-CM | POA: Diagnosis not present

## 2018-12-02 DIAGNOSIS — M9906 Segmental and somatic dysfunction of lower extremity: Secondary | ICD-10-CM | POA: Diagnosis not present

## 2018-12-02 DIAGNOSIS — M62838 Other muscle spasm: Secondary | ICD-10-CM | POA: Diagnosis not present

## 2018-12-09 ENCOUNTER — Encounter (HOSPITAL_COMMUNITY): Payer: Self-pay | Admitting: Psychology

## 2018-12-09 ENCOUNTER — Ambulatory Visit (HOSPITAL_COMMUNITY): Payer: Federal, State, Local not specified - PPO | Admitting: Psychology

## 2018-12-09 NOTE — Progress Notes (Signed)
Rebecca Hahn is a 63 y.o. female patient is a no show for her webex virtual therapy appointment.  Counselor attempted to call as well and left message informing missing appointment.        [name], [facility]

## 2018-12-18 ENCOUNTER — Ambulatory Visit (HOSPITAL_COMMUNITY): Payer: Federal, State, Local not specified - PPO | Admitting: Psychology

## 2018-12-18 ENCOUNTER — Other Ambulatory Visit: Payer: Self-pay

## 2018-12-18 DIAGNOSIS — Z6839 Body mass index (BMI) 39.0-39.9, adult: Secondary | ICD-10-CM | POA: Diagnosis not present

## 2019-01-14 ENCOUNTER — Other Ambulatory Visit: Payer: Self-pay

## 2019-01-14 ENCOUNTER — Ambulatory Visit (INDEPENDENT_AMBULATORY_CARE_PROVIDER_SITE_OTHER): Payer: Federal, State, Local not specified - PPO | Admitting: Psychology

## 2019-01-14 DIAGNOSIS — F330 Major depressive disorder, recurrent, mild: Secondary | ICD-10-CM

## 2019-01-14 DIAGNOSIS — F411 Generalized anxiety disorder: Secondary | ICD-10-CM | POA: Diagnosis not present

## 2019-01-14 NOTE — Progress Notes (Signed)
Virtual Visit via Video Note  I connected with Rebecca Hahn on 01/14/19 at  8:00 AM EDT by a video enabled telemedicine application and verified that I am speaking with the correct person using two identifiers.   I discussed the limitations of evaluation and management by telemedicine and the availability of in person appointments. The patient expressed understanding and agreed to proceed.    I discussed the assessment and treatment plan with the patient. The patient was provided an opportunity to ask questions and all were answered. The patient agreed with the plan and demonstrated an understanding of the instructions.   The patient was advised to call back or seek an in-person evaluation if the symptoms worsen or if the condition fails to improve as anticipated.  I provided 35 minutes of non-face-to-face time during this encounter.   [name] [facility]    THERAPIST PROGRESS NOTE  Session Time: 8am-8.35am  Participation Level: Active  Behavioral Response: Well GroomedAlertaffect bright  Type of Therapy: Individual Therapy  Treatment Goals addressed: Diagnosis: MDD, GAD and goal 1.  Interventions: CBT and Supportive  Summary: Rebecca Hahn is a 63 y.o. female who presents with affect bright.  Pt is at her daughter's and reported her oldest daughter moved to the area recently and has been nice to have her around and support for self as well.  Pt reported that her daughter (who is a Marine scientist) is attending medical visits as prepares w/ workup for bariatric surgery.  Pt reported that her mood has been good- pt reported that she is feeling ready for this surgery.  Pt reported that she has had positive interactions w/ daughters and facetime w/her grandchildren in [place].     Suicidal/Homicidal: Nowithout intent/plan  Therapist Response: Assessed pt current functioning per pt report. Processed w/ pt preparation for surgery and f/u w/ recommendations of her team.  Explored w/ pt positive  interactions w/ family members and positive of support close by.  Plan: Return again in 26month, via webex. Pt to continue f/u w/ Dr. Sharyon Medicus as scheduled.  Pt to continue to [facility] and their recommendations for surgery.  Diagnosis: MDD, GAD   [name] [facility] 01/14/2019

## 2019-01-15 DIAGNOSIS — M5386 Other specified dorsopathies, lumbar region: Secondary | ICD-10-CM | POA: Diagnosis not present

## 2019-01-15 DIAGNOSIS — M9906 Segmental and somatic dysfunction of lower extremity: Secondary | ICD-10-CM | POA: Diagnosis not present

## 2019-01-15 DIAGNOSIS — M9903 Segmental and somatic dysfunction of lumbar region: Secondary | ICD-10-CM | POA: Diagnosis not present

## 2019-01-15 DIAGNOSIS — M62838 Other muscle spasm: Secondary | ICD-10-CM | POA: Diagnosis not present

## 2019-01-15 DIAGNOSIS — M9905 Segmental and somatic dysfunction of pelvic region: Secondary | ICD-10-CM | POA: Diagnosis not present

## 2019-01-16 DIAGNOSIS — Z0000 Encounter for general adult medical examination without abnormal findings: Secondary | ICD-10-CM | POA: Diagnosis not present

## 2019-01-16 DIAGNOSIS — E1165 Type 2 diabetes mellitus with hyperglycemia: Secondary | ICD-10-CM | POA: Diagnosis not present

## 2019-01-23 DIAGNOSIS — Z23 Encounter for immunization: Secondary | ICD-10-CM | POA: Diagnosis not present

## 2019-01-23 DIAGNOSIS — R42 Dizziness and giddiness: Secondary | ICD-10-CM | POA: Diagnosis not present

## 2019-01-23 DIAGNOSIS — Z0000 Encounter for general adult medical examination without abnormal findings: Secondary | ICD-10-CM | POA: Diagnosis not present

## 2019-01-23 DIAGNOSIS — M3501 Sicca syndrome with keratoconjunctivitis: Secondary | ICD-10-CM | POA: Diagnosis not present

## 2019-01-23 DIAGNOSIS — E1165 Type 2 diabetes mellitus with hyperglycemia: Secondary | ICD-10-CM | POA: Diagnosis not present

## 2019-01-28 DIAGNOSIS — L659 Nonscarring hair loss, unspecified: Secondary | ICD-10-CM | POA: Diagnosis not present

## 2019-01-28 DIAGNOSIS — M3500 Sicca syndrome, unspecified: Secondary | ICD-10-CM | POA: Diagnosis not present

## 2019-01-28 DIAGNOSIS — M1990 Unspecified osteoarthritis, unspecified site: Secondary | ICD-10-CM | POA: Diagnosis not present

## 2019-01-28 DIAGNOSIS — L819 Disorder of pigmentation, unspecified: Secondary | ICD-10-CM | POA: Diagnosis not present

## 2019-01-29 DIAGNOSIS — M9905 Segmental and somatic dysfunction of pelvic region: Secondary | ICD-10-CM | POA: Diagnosis not present

## 2019-01-29 DIAGNOSIS — M5386 Other specified dorsopathies, lumbar region: Secondary | ICD-10-CM | POA: Diagnosis not present

## 2019-01-29 DIAGNOSIS — M62838 Other muscle spasm: Secondary | ICD-10-CM | POA: Diagnosis not present

## 2019-01-29 DIAGNOSIS — M9903 Segmental and somatic dysfunction of lumbar region: Secondary | ICD-10-CM | POA: Diagnosis not present

## 2019-01-29 DIAGNOSIS — M9906 Segmental and somatic dysfunction of lower extremity: Secondary | ICD-10-CM | POA: Diagnosis not present

## 2019-02-04 DIAGNOSIS — Z01818 Encounter for other preprocedural examination: Secondary | ICD-10-CM | POA: Diagnosis not present

## 2019-02-07 DIAGNOSIS — Z5189 Encounter for other specified aftercare: Secondary | ICD-10-CM | POA: Diagnosis not present

## 2019-02-12 DIAGNOSIS — M5386 Other specified dorsopathies, lumbar region: Secondary | ICD-10-CM | POA: Diagnosis not present

## 2019-02-12 DIAGNOSIS — M62838 Other muscle spasm: Secondary | ICD-10-CM | POA: Diagnosis not present

## 2019-02-12 DIAGNOSIS — M9905 Segmental and somatic dysfunction of pelvic region: Secondary | ICD-10-CM | POA: Diagnosis not present

## 2019-02-12 DIAGNOSIS — M9903 Segmental and somatic dysfunction of lumbar region: Secondary | ICD-10-CM | POA: Diagnosis not present

## 2019-02-12 DIAGNOSIS — M9906 Segmental and somatic dysfunction of lower extremity: Secondary | ICD-10-CM | POA: Diagnosis not present

## 2019-02-14 DIAGNOSIS — Z01812 Encounter for preprocedural laboratory examination: Secondary | ICD-10-CM | POA: Diagnosis not present

## 2019-02-14 DIAGNOSIS — E039 Hypothyroidism, unspecified: Secondary | ICD-10-CM | POA: Diagnosis not present

## 2019-02-14 DIAGNOSIS — D509 Iron deficiency anemia, unspecified: Secondary | ICD-10-CM | POA: Diagnosis not present

## 2019-02-17 DIAGNOSIS — K317 Polyp of stomach and duodenum: Secondary | ICD-10-CM | POA: Diagnosis not present

## 2019-02-17 DIAGNOSIS — K2950 Unspecified chronic gastritis without bleeding: Secondary | ICD-10-CM | POA: Diagnosis not present

## 2019-03-03 ENCOUNTER — Ambulatory Visit (INDEPENDENT_AMBULATORY_CARE_PROVIDER_SITE_OTHER): Payer: Federal, State, Local not specified - PPO | Admitting: Psychology

## 2019-03-03 ENCOUNTER — Other Ambulatory Visit: Payer: Self-pay

## 2019-03-03 DIAGNOSIS — F411 Generalized anxiety disorder: Secondary | ICD-10-CM

## 2019-03-03 DIAGNOSIS — F330 Major depressive disorder, recurrent, mild: Secondary | ICD-10-CM | POA: Diagnosis not present

## 2019-03-03 NOTE — Progress Notes (Addendum)
Virtual Visit via Video Note  I connected with Rebecca Hahn on 03/03/19 at 10:00 AM EDT by a video enabled telemedicine application and verified that I am speaking with the correct person using two identifiers.   I discussed the limitations of evaluation and management by telemedicine and the availability of in person appointments. The patient expressed understanding and agreed to proceed.    I discussed the assessment and treatment plan with the patient. The patient was provided an opportunity to ask questions and all were answered. The patient agreed with the plan and demonstrated an understanding of the instructions.   The patient was advised to call back or seek an in-person evaluation if the symptoms worsen or if the condition fails to improve as anticipated.  I provided 47 minutes of non-face-to-face time during this encounter.   [name] [facility]    THERAPIST PROGRESS NOTE  Session Time: 10am-10.47am  Participation Level: Active  Behavioral Response: Well GroomedAlertaffect wnl  Type of Therapy: Individual Therapy  Treatment Goals addressed: Diagnosis: MDD and goal 1.  Interventions: CBT and Supportive  Summary: Rebecca Hahn is a 63 y.o. female who presents with affect wnl.  Pt reported that she has been doing ok w/ mood.  Pt reported that she has started at a nearby gym and feels good about this.  Pt reported that she did have a colonoscopy that showed some polyps and awaiting pathology on that.  Pt reported that she has one more procedure for barium on upper GI and then will be scheduled for her bariatric surgery following those results.  Pt still feels good about making this decision and preparing w/ the classes she is taking for that.  Pt reported that she is applying for disability through [place] and will inform if anything needed for that.  Pt reported that her daughters have some stress between them re: transition of care of her 13y/o grandson from daughter who has  raised him to biological mother now.  Pt reported she is keeping good boundaries re: and not taking on their stressors but being supportive.  Suicidal/Homicidal: Nowithout intent/plan  Therapist Response: Assessed pt current functioning per pt report. Processed w/pt coping through stressors and upcoming bariatric surgery.  Explored w/pt her interactions w/ daughters and ways of keeping healthy boundaries to not get brought into the middle.    Plan: Return again in 3 weeks, via webex.  F/u w/ Dr. Daron Offer as scheduled..  Diagnosis: MDD, GAD  [name] [facility] 03/03/2019

## 2019-03-04 ENCOUNTER — Other Ambulatory Visit: Payer: Self-pay | Admitting: Family Medicine

## 2019-03-04 DIAGNOSIS — Z1231 Encounter for screening mammogram for malignant neoplasm of breast: Secondary | ICD-10-CM

## 2019-03-06 DIAGNOSIS — E119 Type 2 diabetes mellitus without complications: Secondary | ICD-10-CM | POA: Diagnosis not present

## 2019-03-06 DIAGNOSIS — M3500 Sicca syndrome, unspecified: Secondary | ICD-10-CM | POA: Diagnosis not present

## 2019-03-13 DIAGNOSIS — F332 Major depressive disorder, recurrent severe without psychotic features: Secondary | ICD-10-CM | POA: Diagnosis not present

## 2019-03-17 DIAGNOSIS — M9905 Segmental and somatic dysfunction of pelvic region: Secondary | ICD-10-CM | POA: Diagnosis not present

## 2019-03-17 DIAGNOSIS — M9901 Segmental and somatic dysfunction of cervical region: Secondary | ICD-10-CM | POA: Diagnosis not present

## 2019-03-17 DIAGNOSIS — M9903 Segmental and somatic dysfunction of lumbar region: Secondary | ICD-10-CM | POA: Diagnosis not present

## 2019-03-17 DIAGNOSIS — M62838 Other muscle spasm: Secondary | ICD-10-CM | POA: Diagnosis not present

## 2019-03-17 DIAGNOSIS — M5386 Other specified dorsopathies, lumbar region: Secondary | ICD-10-CM | POA: Diagnosis not present

## 2019-03-17 DIAGNOSIS — M9906 Segmental and somatic dysfunction of lower extremity: Secondary | ICD-10-CM | POA: Diagnosis not present

## 2019-03-24 ENCOUNTER — Ambulatory Visit (INDEPENDENT_AMBULATORY_CARE_PROVIDER_SITE_OTHER): Payer: Federal, State, Local not specified - PPO | Admitting: Psychology

## 2019-03-24 ENCOUNTER — Other Ambulatory Visit: Payer: Self-pay

## 2019-03-24 DIAGNOSIS — F331 Major depressive disorder, recurrent, moderate: Secondary | ICD-10-CM | POA: Diagnosis not present

## 2019-03-24 DIAGNOSIS — F411 Generalized anxiety disorder: Secondary | ICD-10-CM

## 2019-03-24 NOTE — Progress Notes (Signed)
Virtual Visit via Video Note  I connected with Rebecca Hahn on 03/24/19 at 10:00 AM EST by a video enabled telemedicine application and verified that I am speaking with the correct person using two identifiers.   I discussed the limitations of evaluation and management by telemedicine and the availability of in person appointments. The patient expressed understanding and agreed to proceed.   I discussed the assessment and treatment plan with the patient. The patient was provided an opportunity to ask questions and all were answered. The patient agreed with the plan and demonstrated an understanding of the instructions.   The patient was advised to call back or seek an in-person evaluation if the symptoms worsen or if the condition fails to improve as anticipated.  I provided 47 minutes of non-face-to-face time during this encounter.   [name] [facility]    THERAPIST PROGRESS NOTE  Session Time: 10am-10.47am  Participation Level: Active  Behavioral Response: Well GroomedAlertaffect depressed  Type of Therapy: Individual Therapy  Treatment Goals addressed: Diagnosis: MDd, GAD and goal 1.  Interventions: CBT and Supportive  Summary: Rebecca Hahn is a 63 y.o. female who presents with affect depressed.  Pt reported that she has been feeling down- increased negative self talk and doubting herself.  Pt report sleeps is off, not taking care of self nees as much and reflected in house being messier.  Pt reported that she has had a lot stress related to her daughters.  She reported that one daughter informed planning to take custody of son again next summer- pt was caught off guard about and not sure if what is best for grandson.  Pt has a friend who trying to support her but she felt inadequate to talk to her daughter.  Pt was able to acknowledge that friend not doubting her, but trying to support in way knows.  Pt also shared about conflict between her 2 daughters that are local and feeling  that she is in the middle or needs to mediate. Pt recognized this is causing more stress and ok to set boundaries w/ them.   Suicidal/Homicidal: Nowithout intent/plan  Therapist Response: Assessed pt current functioning per pt report.  Processed w/pt increased report of depression and stressors that are impacting.  Assisted pt in recognizing distortions of negative self talk and doubt.  Encouraged pt to recognize need for boundaries, not responsible for daughters actions or to solve things for conflict that doesn't involve her.  Assisted pt w/ ways of expressing and encouraging self.  Plan: Return again in 2 weeks, via webex.  [facility] as [facility] and [facility].  Diagnosis: MDD, GAD   [name] [facility] 03/24/2019

## 2019-03-31 DIAGNOSIS — M3500 Sicca syndrome, unspecified: Secondary | ICD-10-CM | POA: Diagnosis not present

## 2019-03-31 DIAGNOSIS — E119 Type 2 diabetes mellitus without complications: Secondary | ICD-10-CM | POA: Diagnosis not present

## 2019-04-07 ENCOUNTER — Other Ambulatory Visit: Payer: Self-pay

## 2019-04-07 ENCOUNTER — Ambulatory Visit (INDEPENDENT_AMBULATORY_CARE_PROVIDER_SITE_OTHER): Payer: Federal, State, Local not specified - PPO | Admitting: Psychology

## 2019-04-07 DIAGNOSIS — F411 Generalized anxiety disorder: Secondary | ICD-10-CM | POA: Diagnosis not present

## 2019-04-07 DIAGNOSIS — M5386 Other specified dorsopathies, lumbar region: Secondary | ICD-10-CM | POA: Diagnosis not present

## 2019-04-07 DIAGNOSIS — M62838 Other muscle spasm: Secondary | ICD-10-CM | POA: Diagnosis not present

## 2019-04-07 DIAGNOSIS — M9906 Segmental and somatic dysfunction of lower extremity: Secondary | ICD-10-CM | POA: Diagnosis not present

## 2019-04-07 DIAGNOSIS — M9905 Segmental and somatic dysfunction of pelvic region: Secondary | ICD-10-CM | POA: Diagnosis not present

## 2019-04-07 DIAGNOSIS — F331 Major depressive disorder, recurrent, moderate: Secondary | ICD-10-CM | POA: Diagnosis not present

## 2019-04-07 DIAGNOSIS — M9903 Segmental and somatic dysfunction of lumbar region: Secondary | ICD-10-CM | POA: Diagnosis not present

## 2019-04-07 NOTE — Progress Notes (Signed)
Virtual Visit via Video Note  I connected with Rebecca Hahn on 04/07/19 at 10:00 AM EST by a video enabled telemedicine application and verified that I am speaking with the correct person using two identifiers.   I discussed the limitations of evaluation and management by telemedicine and the availability of in person appointments. The patient expressed understanding and agreed to proceed.    I discussed the assessment and treatment plan with the patient. The patient was provided an opportunity to ask questions and all were answered. The patient agreed with the plan and demonstrated an understanding of the instructions.   The patient was advised to call back or seek an in-person evaluation if the symptoms worsen or if the condition fails to improve as anticipated.  I provided 45 minutes of non-face-to-face time during this encounter.   [name] [facility]    THERAPIST PROGRESS NOTE  Session Time: 10am-10.45am  Participation Level: Active  Behavioral Response: Well GroomedAlertaffect wnl  Type of Therapy: Individual Therapy  Treatment Goals addressed: Diagnosis: MDD, GAD and goal 1.  Interventions: CBT and Strength-based  Summary: Rebecca Hahn is a 63 y.o. female who presents with affect bright. Pt reported that she is doing better w/ her mood since last session.  Pt reported that she isn't feeling as stressed and not doubting self as much.  Pt reported that she was able to set boundaries w/ both of her daughters and helped her not feel in the middle.  Pt reported that she also gained realization that when friends are straight forward w/ her and she doesn't like it that she will cut them off and that she didn't need to do that.  Pt reported that she has continued talking w/ her friend and that friend is able to be a support for her w/ upcoming [place] appointment.  Pt reported that she is still working on her self care, not putting things off around the house.  Pt reported that she hopes  that she will be able to have surgery before the Christmas holiday.    Suicidal/Homicidal: Nowithout intent/plan  Therapist Response: Assessed pt current functioning per pt report. Processed w/pt coping w/ interactions w/ her daughter and conflict between daughters. Reflected benefit w/ having healthy boundaries to reduce her stress and trying to solve things that are beyond her control.  Encouraged pt to continue asserting healthy boundaries and keeping in contact w/ her supports.   Plan: Return again in 2 weeks, via webex.  F/u as scheduled w/ [facility]. Diagnosis: MDD, GAD   [name] [facility] 04/07/2019

## 2019-04-14 DIAGNOSIS — G43909 Migraine, unspecified, not intractable, without status migrainosus: Secondary | ICD-10-CM | POA: Diagnosis not present

## 2019-04-14 DIAGNOSIS — Z9989 Dependence on other enabling machines and devices: Secondary | ICD-10-CM | POA: Diagnosis not present

## 2019-04-14 DIAGNOSIS — G4730 Sleep apnea, unspecified: Secondary | ICD-10-CM | POA: Diagnosis not present

## 2019-04-14 DIAGNOSIS — R202 Paresthesia of skin: Secondary | ICD-10-CM | POA: Diagnosis not present

## 2019-04-21 ENCOUNTER — Other Ambulatory Visit: Payer: Self-pay

## 2019-04-21 ENCOUNTER — Ambulatory Visit (INDEPENDENT_AMBULATORY_CARE_PROVIDER_SITE_OTHER): Payer: Federal, State, Local not specified - PPO | Admitting: Psychology

## 2019-04-21 DIAGNOSIS — Z1231 Encounter for screening mammogram for malignant neoplasm of breast: Secondary | ICD-10-CM | POA: Diagnosis not present

## 2019-04-21 DIAGNOSIS — Z800 Family history of malignant neoplasm of digestive organs: Secondary | ICD-10-CM | POA: Diagnosis not present

## 2019-04-21 DIAGNOSIS — F331 Major depressive disorder, recurrent, moderate: Secondary | ICD-10-CM

## 2019-04-21 DIAGNOSIS — F411 Generalized anxiety disorder: Secondary | ICD-10-CM | POA: Diagnosis not present

## 2019-04-21 DIAGNOSIS — Z87891 Personal history of nicotine dependence: Secondary | ICD-10-CM | POA: Diagnosis not present

## 2019-04-21 NOTE — Progress Notes (Signed)
Virtual Visit via Video Note  I connected with Rebecca Hahn on 04/21/19 at 10:00 AM EST by a video enabled telemedicine application and verified that I am speaking with the correct person using two identifiers.   I discussed the limitations of evaluation and management by telemedicine and the availability of in person appointments. The patient expressed understanding and agreed to proceed.    I discussed the assessment and treatment plan with the patient. The patient was provided an opportunity to ask questions and all were answered. The patient agreed with the plan and demonstrated an understanding of the instructions.   The patient was advised to call back or seek an in-person evaluation if the symptoms worsen or if the condition fails to improve as anticipated.  I provided 50 minutes of non-face-to-face time during this encounter.   [name] [facility]    THERAPIST PROGRESS NOTE  Session Time: 10am-10.50am  Participation Level: Active  Behavioral Response: Well GroomedAlertaffect wnl  Type of Therapy: Individual Therapy  Treatment Goals addressed: Diagnosis: MDD, GAD and goal 1.  Interventions: CBT and Supportive  Summary: Rebecca Hahn is a 63 y.o. female who presents with affect wnl.  pt reported that her mood is good today.  Pt reported that she was struggling w/ depressed moods over past week as she was struggling w/ her daughter taking a traveling nurse job and thoughts that her daughter is abandoning her.  Pt reported that she was able to use her supports and reframe that thinking and able to then talk w/her daughter and express her thoughts and feelings and get clarity as well.  Pt reported that she is aware that her daughter is not leaving to get away from her, but for her job opportunity and needs.  Pt reported that she also feeling that she will be able to use her resources and explored an independent living program to transition to and gives her the ability to get more  support as she needs.  Pt reports this wouldn't be till next fall if makes that move. Pt reported was able to refocus when getting off track and reminding selt to be present focus and make decisions based on what is in her control.  Pt reported that her visit at the [place] when well and enjoyed time w/ her friend that was her support/transportation.  Pt reported that will hear this week about date for surgery.   Suicidal/Homicidal: Nowithout intent/plan  Therapist Response: Assessed pt current functioning per pt report.  Processed w/pt coping w/ feelings about daughter leaving.  Reflected good use of supports and ability to communicate what she feels.  Reiterated the reframe that pt was already able to come to.  Discussed things she can do to prepare for self care after her surgery and other resources.  encouraged pt to stay present focus and with what is in her control.   Plan: Return again in 2 weeks, via webex.  Diagnosis: MDD, GAD   [name] [facility] 04/21/2019

## 2019-04-22 ENCOUNTER — Ambulatory Visit: Payer: Federal, State, Local not specified - PPO

## 2019-04-22 DIAGNOSIS — H2513 Age-related nuclear cataract, bilateral: Secondary | ICD-10-CM | POA: Diagnosis not present

## 2019-04-22 DIAGNOSIS — H5213 Myopia, bilateral: Secondary | ICD-10-CM | POA: Diagnosis not present

## 2019-04-22 DIAGNOSIS — E1136 Type 2 diabetes mellitus with diabetic cataract: Secondary | ICD-10-CM | POA: Diagnosis not present

## 2019-04-22 DIAGNOSIS — M3501 Sicca syndrome with keratoconjunctivitis: Secondary | ICD-10-CM | POA: Diagnosis not present

## 2019-04-29 DIAGNOSIS — L819 Disorder of pigmentation, unspecified: Secondary | ICD-10-CM | POA: Diagnosis not present

## 2019-04-29 DIAGNOSIS — M3500 Sicca syndrome, unspecified: Secondary | ICD-10-CM | POA: Diagnosis not present

## 2019-04-29 DIAGNOSIS — L659 Nonscarring hair loss, unspecified: Secondary | ICD-10-CM | POA: Diagnosis not present

## 2019-04-29 DIAGNOSIS — R768 Other specified abnormal immunological findings in serum: Secondary | ICD-10-CM | POA: Diagnosis not present

## 2019-05-05 ENCOUNTER — Ambulatory Visit (HOSPITAL_COMMUNITY): Payer: Federal, State, Local not specified - PPO | Admitting: Psychology

## 2019-05-05 DIAGNOSIS — M5386 Other specified dorsopathies, lumbar region: Secondary | ICD-10-CM | POA: Diagnosis not present

## 2019-05-05 DIAGNOSIS — M62838 Other muscle spasm: Secondary | ICD-10-CM | POA: Diagnosis not present

## 2019-05-05 DIAGNOSIS — M9903 Segmental and somatic dysfunction of lumbar region: Secondary | ICD-10-CM | POA: Diagnosis not present

## 2019-05-05 DIAGNOSIS — M9901 Segmental and somatic dysfunction of cervical region: Secondary | ICD-10-CM | POA: Diagnosis not present

## 2019-05-05 DIAGNOSIS — M9905 Segmental and somatic dysfunction of pelvic region: Secondary | ICD-10-CM | POA: Diagnosis not present

## 2019-05-05 DIAGNOSIS — M9906 Segmental and somatic dysfunction of lower extremity: Secondary | ICD-10-CM | POA: Diagnosis not present

## 2019-05-12 DIAGNOSIS — G4733 Obstructive sleep apnea (adult) (pediatric): Secondary | ICD-10-CM | POA: Diagnosis not present

## 2019-05-29 ENCOUNTER — Ambulatory Visit (INDEPENDENT_AMBULATORY_CARE_PROVIDER_SITE_OTHER): Payer: Federal, State, Local not specified - PPO | Admitting: Psychology

## 2019-05-29 ENCOUNTER — Other Ambulatory Visit: Payer: Self-pay

## 2019-05-29 DIAGNOSIS — F330 Major depressive disorder, recurrent, mild: Secondary | ICD-10-CM

## 2019-05-29 DIAGNOSIS — F411 Generalized anxiety disorder: Secondary | ICD-10-CM

## 2019-05-29 NOTE — Progress Notes (Signed)
Virtual Visit via Video Note  I connected with Rebecca Hahn on 05/29/19 at 12:30 PM EST by a video enabled telemedicine application and verified that I am speaking with the correct person using two identifiers.   I discussed the limitations of evaluation and management by telemedicine and the availability of in person appointments. The patient expressed understanding and agreed to proceed.    I discussed the assessment and treatment plan with the patient. The patient was provided an opportunity to ask questions and all were answered. The patient agreed with the plan and demonstrated an understanding of the instructions.   The patient was advised to call back or seek an in-person evaluation if the symptoms worsen or if the condition fails to improve as anticipated.  I provided 53 minutes of non-face-to-face time during this encounter.   Rebecca Hahn [facility]    THERAPIST PROGRESS NOTE  Session Time: 12.30pm-1.23pm  Participation Level: Active  Behavioral Response: Well GroomedAlertaffect bright  Type of Therapy: Individual Therapy  Treatment Goals addressed: Diagnosis: MDD, GAD and goal 1.  Interventions: CBT and Strength-based  Summary: Rebecca Hahn is a 64 y.o. female who presents with affect bright.  Pt reported that she has been doing better w/ mood and energy.  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  Pt reported that she has been feeling better w/ this self care.  Pt reported that she has had some things that are stressors come up w/ her daughter and is learning to set healthy boundaries for self and that needs to take care of self. Pt reported that she has met someone talking through online dating service and feels good about how this progressing and getting to know each other.  Pt aware of taking things slowly and focus on getting to know each other.  Pt reported she hasn't  heard anything from [place] about gastric bypass scheduling and recognized needs to f/u and not take as being put off.   Suicidal/Homicidal: Nowithout intent/plan  Therapist Response: Assessed pt current functioning per pt report.  Processed w/pt coping w/ stressors, setting boundaries. Encouraged pt positive self care strategies and taking change/relationships slowly.  Plan: Return again in 4 weeks, via webex.  [facility] and f/u w/ psychiatrist as scheduled.   Diagnosis: MDD, GAD   Rebecca Hahn [facility] 05/29/2019

## 2019-06-03 DIAGNOSIS — I951 Orthostatic hypotension: Secondary | ICD-10-CM | POA: Diagnosis not present

## 2019-06-09 DIAGNOSIS — F332 Major depressive disorder, recurrent severe without psychotic features: Secondary | ICD-10-CM | POA: Diagnosis not present

## 2019-06-11 DIAGNOSIS — M62838 Other muscle spasm: Secondary | ICD-10-CM | POA: Diagnosis not present

## 2019-06-11 DIAGNOSIS — M9906 Segmental and somatic dysfunction of lower extremity: Secondary | ICD-10-CM | POA: Diagnosis not present

## 2019-06-11 DIAGNOSIS — M9901 Segmental and somatic dysfunction of cervical region: Secondary | ICD-10-CM | POA: Diagnosis not present

## 2019-06-11 DIAGNOSIS — M9903 Segmental and somatic dysfunction of lumbar region: Secondary | ICD-10-CM | POA: Diagnosis not present

## 2019-06-11 DIAGNOSIS — M5386 Other specified dorsopathies, lumbar region: Secondary | ICD-10-CM | POA: Diagnosis not present

## 2019-06-13 DIAGNOSIS — M150 Primary generalized (osteo)arthritis: Secondary | ICD-10-CM | POA: Diagnosis not present

## 2019-06-13 DIAGNOSIS — M3501 Sicca syndrome with keratoconjunctivitis: Secondary | ICD-10-CM | POA: Diagnosis not present

## 2019-06-13 DIAGNOSIS — E1165 Type 2 diabetes mellitus with hyperglycemia: Secondary | ICD-10-CM | POA: Diagnosis not present

## 2019-06-19 DIAGNOSIS — M3501 Sicca syndrome with keratoconjunctivitis: Secondary | ICD-10-CM | POA: Diagnosis not present

## 2019-06-19 DIAGNOSIS — E1165 Type 2 diabetes mellitus with hyperglycemia: Secondary | ICD-10-CM | POA: Diagnosis not present

## 2019-06-30 ENCOUNTER — Ambulatory Visit (HOSPITAL_COMMUNITY): Payer: Federal, State, Local not specified - PPO | Admitting: Psychology

## 2019-07-07 DIAGNOSIS — M62838 Other muscle spasm: Secondary | ICD-10-CM | POA: Diagnosis not present

## 2019-07-07 DIAGNOSIS — M9901 Segmental and somatic dysfunction of cervical region: Secondary | ICD-10-CM | POA: Diagnosis not present

## 2019-07-07 DIAGNOSIS — M5386 Other specified dorsopathies, lumbar region: Secondary | ICD-10-CM | POA: Diagnosis not present

## 2019-07-07 DIAGNOSIS — M9906 Segmental and somatic dysfunction of lower extremity: Secondary | ICD-10-CM | POA: Diagnosis not present

## 2019-07-07 DIAGNOSIS — M9903 Segmental and somatic dysfunction of lumbar region: Secondary | ICD-10-CM | POA: Diagnosis not present

## 2019-07-07 DIAGNOSIS — M9902 Segmental and somatic dysfunction of thoracic region: Secondary | ICD-10-CM | POA: Diagnosis not present

## 2019-07-14 DIAGNOSIS — M9901 Segmental and somatic dysfunction of cervical region: Secondary | ICD-10-CM | POA: Diagnosis not present

## 2019-07-14 DIAGNOSIS — M9903 Segmental and somatic dysfunction of lumbar region: Secondary | ICD-10-CM | POA: Diagnosis not present

## 2019-07-14 DIAGNOSIS — M9904 Segmental and somatic dysfunction of sacral region: Secondary | ICD-10-CM | POA: Diagnosis not present

## 2019-07-14 DIAGNOSIS — M9902 Segmental and somatic dysfunction of thoracic region: Secondary | ICD-10-CM | POA: Diagnosis not present

## 2019-07-18 DIAGNOSIS — M9903 Segmental and somatic dysfunction of lumbar region: Secondary | ICD-10-CM | POA: Diagnosis not present

## 2019-07-18 DIAGNOSIS — M9902 Segmental and somatic dysfunction of thoracic region: Secondary | ICD-10-CM | POA: Diagnosis not present

## 2019-07-18 DIAGNOSIS — M9904 Segmental and somatic dysfunction of sacral region: Secondary | ICD-10-CM | POA: Diagnosis not present

## 2019-07-18 DIAGNOSIS — M9901 Segmental and somatic dysfunction of cervical region: Secondary | ICD-10-CM | POA: Diagnosis not present

## 2019-07-18 DIAGNOSIS — M545 Low back pain: Secondary | ICD-10-CM | POA: Diagnosis not present

## 2019-07-22 ENCOUNTER — Ambulatory Visit (INDEPENDENT_AMBULATORY_CARE_PROVIDER_SITE_OTHER): Payer: Federal, State, Local not specified - PPO | Admitting: Psychology

## 2019-07-22 ENCOUNTER — Other Ambulatory Visit: Payer: Self-pay

## 2019-07-22 DIAGNOSIS — F330 Major depressive disorder, recurrent, mild: Secondary | ICD-10-CM | POA: Diagnosis not present

## 2019-07-22 DIAGNOSIS — F411 Generalized anxiety disorder: Secondary | ICD-10-CM

## 2019-07-22 DIAGNOSIS — M3500 Sicca syndrome, unspecified: Secondary | ICD-10-CM | POA: Diagnosis not present

## 2019-07-22 DIAGNOSIS — H04123 Dry eye syndrome of bilateral lacrimal glands: Secondary | ICD-10-CM | POA: Diagnosis not present

## 2019-07-22 NOTE — Progress Notes (Signed)
Virtual Visit via Video Note  I connected with Rebecca Hahn on 07/22/19 at 11:00 AM EST by a video enabled telemedicine application and verified that I am speaking with the correct person using two identifiers.   I discussed the limitations of evaluation and management by telemedicine and the availability of in person appointments. The patient expressed understanding and agreed to proceed.    I discussed the assessment and treatment plan with the patient. The patient was provided an opportunity to ask questions and all were answered. The patient agreed with the plan and demonstrated an understanding of the instructions.   The patient was advised to call back or seek an in-person evaluation if the symptoms worsen or if the condition fails to improve as anticipated.  I provided 47 minutes of non-face-to-face time during this encounter.   Forde Radon [facility]    THERAPIST PROGRESS NOTE  Session Time: 11am -11.47am  Participation Level: Active  Behavioral Response: Well GroomedAlertaffect bright  Type of Therapy: Individual Therapy  Treatment Goals addressed: Diagnosis: MDD, GAD and goal 1.  Interventions: CBT, Solution Focused, Supportive and Other: boundaries  Summary: Rebecca Hahn is a 64 y.o. female who presents with affect wnl. Pt reported that she feels very blessed as her [facility] benefits were increased and gave her more financial assistance that has been easier to meet all her financial obligations.  Pt reported her friend has continued to be a support and positve interactions w/one daughter. Pt discussed stressor of some interactions and wants from her other daughters- one asking to pay for child care last month and then again this month and the other relying on her to caregive for her teen son more than had agreed initially.  Pt reported that she has been able to set boundary and reinstate that can have grandson over a day on weekend and setting boundary w daughter this morning that  doesn't have money to pay the daycare bill.  Pt was able to reframe that this doesn't make her a bad mom, still supporting her family and taking care of her own needs.  Pt discussed idea of going to pool 3 days a week for class. Pt was able to evaluate that she is not a morning person and to go from none to 3 days a week for a 9am class- won't work for her.  Pt identified that better to explore other options.  Suicidal/Homicidal: Nowithout intent/plan  Therapist Response: Assessed pt current functioning per pt report.  Processed w/pt recent stressors and interactions w/ her children.  Explored w/pt ways of setting boundaries and that can support w/out financially supporting/caregiving for grandchildren.  discussed w/ pt beginning routine w/ her exercise and making a plan for self that fits for her and better change of followthrough.    Plan: Return again in 4 weeks, via webex.  F/u as scheduled w/ Dr[facility] Rene Koc[facility] and [facility].  Diagnosis: MDD, GAD    Forde Radon [facility] 07/22/2019

## 2019-07-23 DIAGNOSIS — Z20828 Contact with and (suspected) exposure to other viral communicable diseases: Secondary | ICD-10-CM | POA: Diagnosis not present

## 2019-08-02 ENCOUNTER — Other Ambulatory Visit: Payer: Self-pay

## 2019-08-02 ENCOUNTER — Ambulatory Visit (HOSPITAL_COMMUNITY)
Admission: EM | Admit: 2019-08-02 | Discharge: 2019-08-02 | Disposition: A | Discharge status: Home / Self Care | Payer: Federal, State, Local not specified - PPO

## 2019-08-02 NOTE — ED Triage Notes (Signed)
Pt reports having had a fall with a closed head injury in Nov 2020; states had spoken with neurologist @ [place] following fall, but had no eval.  States since fall, has been experiencing frequent HAs.  Today she woke up with very sensitive right sided scalp tenderness, radiating into right face, right lateral neck.  Also c/o blurred vision.  C/O fatigue.  Also recently had episode of numbness in right side of face extending into RUE, but not today.  Pt and daughter concerned for sxs since fall.  Discussed above with Dr. Milus Glazier.  Explained to pt & family that we would not be able to give a definitive dx in [facility], and that we recommend she go to ED @ this time.  Pt and family both verbalized understanding.

## 2019-08-04 DIAGNOSIS — M9904 Segmental and somatic dysfunction of sacral region: Secondary | ICD-10-CM | POA: Diagnosis not present

## 2019-08-04 DIAGNOSIS — M545 Low back pain: Secondary | ICD-10-CM | POA: Diagnosis not present

## 2019-08-04 DIAGNOSIS — M9901 Segmental and somatic dysfunction of cervical region: Secondary | ICD-10-CM | POA: Diagnosis not present

## 2019-08-04 DIAGNOSIS — M9902 Segmental and somatic dysfunction of thoracic region: Secondary | ICD-10-CM | POA: Diagnosis not present

## 2019-08-04 DIAGNOSIS — M9903 Segmental and somatic dysfunction of lumbar region: Secondary | ICD-10-CM | POA: Diagnosis not present

## 2019-08-06 DIAGNOSIS — M9902 Segmental and somatic dysfunction of thoracic region: Secondary | ICD-10-CM | POA: Diagnosis not present

## 2019-08-06 DIAGNOSIS — M9901 Segmental and somatic dysfunction of cervical region: Secondary | ICD-10-CM | POA: Diagnosis not present

## 2019-08-06 DIAGNOSIS — M9903 Segmental and somatic dysfunction of lumbar region: Secondary | ICD-10-CM | POA: Diagnosis not present

## 2019-08-06 DIAGNOSIS — M9904 Segmental and somatic dysfunction of sacral region: Secondary | ICD-10-CM | POA: Diagnosis not present

## 2019-08-08 DIAGNOSIS — M9903 Segmental and somatic dysfunction of lumbar region: Secondary | ICD-10-CM | POA: Diagnosis not present

## 2019-08-08 DIAGNOSIS — M9901 Segmental and somatic dysfunction of cervical region: Secondary | ICD-10-CM | POA: Diagnosis not present

## 2019-08-08 DIAGNOSIS — M9902 Segmental and somatic dysfunction of thoracic region: Secondary | ICD-10-CM | POA: Diagnosis not present

## 2019-08-08 DIAGNOSIS — M9904 Segmental and somatic dysfunction of sacral region: Secondary | ICD-10-CM | POA: Diagnosis not present

## 2019-08-11 DIAGNOSIS — W19XXXA Unspecified fall, initial encounter: Secondary | ICD-10-CM | POA: Diagnosis not present

## 2019-08-11 DIAGNOSIS — S060X9A Concussion with loss of consciousness of unspecified duration, initial encounter: Secondary | ICD-10-CM | POA: Diagnosis not present

## 2019-08-11 DIAGNOSIS — G43909 Migraine, unspecified, not intractable, without status migrainosus: Secondary | ICD-10-CM | POA: Diagnosis not present

## 2019-08-26 ENCOUNTER — Ambulatory Visit (HOSPITAL_COMMUNITY): Payer: Federal, State, Local not specified - PPO | Admitting: Psychology

## 2019-08-28 DIAGNOSIS — R768 Other specified abnormal immunological findings in serum: Secondary | ICD-10-CM | POA: Diagnosis not present

## 2019-08-28 DIAGNOSIS — L819 Disorder of pigmentation, unspecified: Secondary | ICD-10-CM | POA: Diagnosis not present

## 2019-08-28 DIAGNOSIS — M3500 Sicca syndrome, unspecified: Secondary | ICD-10-CM | POA: Diagnosis not present

## 2019-08-28 DIAGNOSIS — L659 Nonscarring hair loss, unspecified: Secondary | ICD-10-CM | POA: Diagnosis not present

## 2019-09-02 DIAGNOSIS — F332 Major depressive disorder, recurrent severe without psychotic features: Secondary | ICD-10-CM | POA: Diagnosis not present

## 2019-09-04 DIAGNOSIS — Z7984 Long term (current) use of oral hypoglycemic drugs: Secondary | ICD-10-CM | POA: Diagnosis not present

## 2019-09-04 DIAGNOSIS — Z0001 Encounter for general adult medical examination with abnormal findings: Secondary | ICD-10-CM | POA: Diagnosis not present

## 2019-09-04 DIAGNOSIS — Z741 Need for assistance with personal care: Secondary | ICD-10-CM | POA: Diagnosis not present

## 2019-09-04 DIAGNOSIS — E785 Hyperlipidemia, unspecified: Secondary | ICD-10-CM | POA: Diagnosis not present

## 2019-09-04 DIAGNOSIS — E559 Vitamin D deficiency, unspecified: Secondary | ICD-10-CM | POA: Diagnosis not present

## 2019-09-04 DIAGNOSIS — E119 Type 2 diabetes mellitus without complications: Secondary | ICD-10-CM | POA: Diagnosis not present

## 2019-09-04 DIAGNOSIS — J329 Chronic sinusitis, unspecified: Secondary | ICD-10-CM | POA: Diagnosis not present

## 2019-09-05 ENCOUNTER — Emergency Department (HOSPITAL_BASED_OUTPATIENT_CLINIC_OR_DEPARTMENT_OTHER)
Admit: 2019-09-05 | Discharge: 2019-09-05 | Disposition: A | Discharge status: Home / Self Care | Payer: No Typology Code available for payment source | Attending: Emergency Medicine | Admitting: Emergency Medicine

## 2019-09-05 ENCOUNTER — Emergency Department (HOSPITAL_COMMUNITY)
Admission: EM | Admit: 2019-09-05 | Discharge: 2019-09-05 | Disposition: A | Discharge status: Home / Self Care | Payer: No Typology Code available for payment source | Attending: Emergency Medicine | Admitting: Emergency Medicine

## 2019-09-05 ENCOUNTER — Encounter (HOSPITAL_COMMUNITY): Payer: Self-pay

## 2019-09-05 ENCOUNTER — Other Ambulatory Visit: Payer: Self-pay

## 2019-09-05 DIAGNOSIS — M7989 Other specified soft tissue disorders: Secondary | ICD-10-CM

## 2019-09-05 DIAGNOSIS — Z79899 Other long term (current) drug therapy: Secondary | ICD-10-CM | POA: Insufficient documentation

## 2019-09-05 DIAGNOSIS — E119 Type 2 diabetes mellitus without complications: Secondary | ICD-10-CM | POA: Insufficient documentation

## 2019-09-05 DIAGNOSIS — Z7982 Long term (current) use of aspirin: Secondary | ICD-10-CM | POA: Insufficient documentation

## 2019-09-05 DIAGNOSIS — Z7984 Long term (current) use of oral hypoglycemic drugs: Secondary | ICD-10-CM | POA: Diagnosis not present

## 2019-09-05 DIAGNOSIS — R0602 Shortness of breath: Secondary | ICD-10-CM | POA: Diagnosis not present

## 2019-09-05 DIAGNOSIS — R252 Cramp and spasm: Secondary | ICD-10-CM

## 2019-09-05 DIAGNOSIS — Z87891 Personal history of nicotine dependence: Secondary | ICD-10-CM | POA: Diagnosis not present

## 2019-09-05 DIAGNOSIS — R2242 Localized swelling, mass and lump, left lower limb: Secondary | ICD-10-CM | POA: Diagnosis not present

## 2019-09-05 DIAGNOSIS — R52 Pain, unspecified: Secondary | ICD-10-CM | POA: Diagnosis not present

## 2019-09-05 DIAGNOSIS — M79605 Pain in left leg: Secondary | ICD-10-CM | POA: Insufficient documentation

## 2019-09-05 LAB — I-STAT CHEM 8, ED
BUN: 15 mg/dL (ref 8–23)
Calcium, Ion: 1.17 mmol/L (ref 1.15–1.40)
Chloride: 102 mmol/L (ref 98–111)
Creatinine, Ser: 0.8 mg/dL (ref 0.44–1.00)
Glucose, Bld: 81 mg/dL (ref 70–99)
HCT: 38 % (ref 36.0–46.0)
Hemoglobin: 12.9 g/dL (ref 12.0–15.0)
Potassium: 4 mmol/L (ref 3.5–5.1)
Sodium: 140 mmol/L (ref 135–145)
TCO2: 28 mmol/L (ref 22–32)

## 2019-09-05 NOTE — ED Triage Notes (Signed)
Patient c/o right lower back pain that radiates down the right leg x approx 1 month.  Patient states she has been swelling to the right leg since yesterday. Patient also c/o pain in the left calf. Patient states she had a "charlie horse in the left calf last night" and states the pain has been there since.

## 2019-09-05 NOTE — ED Provider Notes (Signed)
[facility]-EMERGENCY DEPT Provider Note   CSN: [id number] Arrival date & time: 09/05/19  1150     History Chief Complaint  Patient presents with  . Leg Swelling  . Back Pain    Rebecca Hahn is a 64 y.o. female who presents with leg pain and leg swelling.  Patient states that last night she developed an acute onset of pain in the calf of her left leg.  Felt like a charley horse.  She also has been having intermittent swelling in the right leg.  She is scheduled to have gastric bypass in [place] soon and called her doctor and they wanted her to have a DVT study to rule out a blood clot.  She states that she has been diagnosed with a superficial clot before but never DVT.  She has been on a special liquid diet for the past week for her upcoming surgery.  She had blood work done yesterday at the [place] but she does not know the results.  She reports low back pain which is chronic in nature but denies any radicular symptoms.  She states that the left calf feels tender but there is no swelling in it.  There is no pain in the right calf.  She has chest pain that comes and goes which she attributes to indigestion.  She also has chronic shortness of breath with exertion.  HPI     Past Medical History:  Diagnosis Date  . Agenesis of gallbladder   . Anemia   . Anxiety   . Colon polyp   . DDD (degenerative disc disease), lumbar   . Depression   . Diabetes mellitus, type II (HCC)   . GERD (gastroesophageal reflux disease)   . Glaucoma   . Gout   . Lumbar radiculopathy   . Osteoarthritis   . Sjoegren syndrome   . Sleep apnea    CPAP  . Vertigo     There are no problems to display for this patient.   Past Surgical History:  Procedure Laterality Date  . CESAREAN SECTION    . CHOLECYSTECTOMY    . TUBAL LIGATION       OB History   No obstetric history on file.     Family History  Problem Relation Age of Onset  . Depression Maternal Aunt   . Gallstones  Mother   . Colon cancer Maternal Uncle   . Lupus Cousin   . Cancer Cousin     Social History   Tobacco Use  . Smoking status: Former Smoker    Types: Cigarettes    Quit date: 1978    Years since quitting: 43.3  . Smokeless tobacco: Never Used  . Tobacco comment: in teen years  Substance Use Topics  . Alcohol use: No  . Drug use: No    Home Medications Prior to Admission medications   Medication Sig Start Date End Date Taking? Authorizing Provider  albuterol (PROVENTIL HFA;VENTOLIN HFA) 108 (90 Base) MCG/ACT inhaler Inhale 1-2 puffs into the lungs every 6 (six) hours as needed for wheezing or shortness of breath. 05/18/17   Lurene Shadow, PA-C  allopurinol (ZYLOPRIM) 100 MG tablet Take 200 mg by mouth daily.  12/31/16   [provider]  aspirin 81 MG chewable tablet Chew 81 mg by mouth daily.    [provider]  clonazePAM (KLONOPIN) 0.5 MG tablet Take 1 tablet (0.5 mg total) by mouth 2 (two) times daily. 06/29/17   Eksir, Bo Mcclintock, MD  cycloSPORINE (RESTASIS) 0.05 % ophthalmic emulsion 1 drop 2 (two) times daily.    [provider]  Diclofenac Sodium CR 100 MG 24 hr tablet Take 100 mg by mouth daily. 12/20/16   [provider]  gabapentin (NEURONTIN) 100 MG capsule Take 100 mg by mouth 3 (three) times daily as needed. 01/01/17   [provider]  meclizine (ANTIVERT) 12.5 MG tablet Take 12.5 mg by mouth 3 (three) times daily as needed for dizziness.    [provider]  metFORMIN (GLUCOPHAGE) 500 MG tablet Take 500 mg by mouth daily with breakfast.    [provider]  Multiple Vitamins-Minerals (CENTRUM SILVER 50+WOMEN) TABS Take 1 tablet by mouth daily.    [provider]  rosuvastatin (CRESTOR) 20 MG tablet Take 20 mg by mouth daily.    [provider]  sertraline (ZOLOFT) 100 MG tablet Take 1 tablet (100 mg total) by mouth at bedtime. 10/23/17   Burnard Leigh, MD  traZODone (DESYREL) 100 MG  tablet Take 1 tablet (100 mg total) by mouth at bedtime. 06/29/17   Eksir, Bo Mcclintock, MD    Allergies    Penicillins  Review of Systems   Review of Systems  Respiratory: Positive for shortness of breath.   Cardiovascular: Positive for chest pain and leg swelling.  Musculoskeletal: Positive for myalgias.  All other systems reviewed and are negative.   Physical Exam Updated Vital Signs BP (!) 172/93 (BP Location: Left Arm)   Pulse 81   Temp 98.1 F (36.7 C) (Oral)   Resp 16   Ht 5' 6.5" (1.689 m)   Wt 114.8 kg   SpO2 95%   BMI 40.22 kg/m   Physical Exam Vitals and nursing note reviewed.  Constitutional:      General: She is not in acute distress.    Appearance: Normal appearance. She is well-developed. She is not ill-appearing.  HENT:     Head: Normocephalic and atraumatic.  Eyes:     General: No scleral icterus.       Right eye: No discharge.        Left eye: No discharge.     Conjunctiva/sclera: Conjunctivae normal.     Pupils: Pupils are equal, round, and reactive to light.  Cardiovascular:     Rate and Rhythm: Normal rate and regular rhythm.  Pulmonary:     Effort: Pulmonary effort is normal. No respiratory distress.     Breath sounds: Normal breath sounds.  Abdominal:     General: There is no distension.  Musculoskeletal:     Cervical back: Normal range of motion.     Comments: No significant swelling of the bilateral lower extremities. Mild tenderness of the left calf and behind the right knee. No palpable cords. Compartments are soft. 2+ DP pulse bilaterally.   Skin:    General: Skin is warm and dry.  Neurological:     Mental Status: She is alert and oriented to person, place, and time.  Psychiatric:        Behavior: Behavior normal.     ED Results / Procedures / Treatments   Labs (all labs ordered are listed, but only abnormal results are displayed) Labs Reviewed  I-STAT CHEM 8, ED    EKG None  Radiology VAS [place] LOWER EXTREMITY VENOUS  (DVT) (ONLY [facility] & [facility])  Result Date: 09/05/2019  Lower Venous DVTStudy Indications: Pain, and Swelling.  Risk Factors: None identified. Limitations: Body habitus and poor ultrasound/tissue interface. Comparison Study: No prior studies.  Performing Technologist: Chanda Busing RVT  Examination Guidelines: A complete evaluation includes B-mode imaging, spectral Doppler, color Doppler, and power Doppler as needed of all accessible portions of each vessel. Bilateral testing is considered an integral part of a complete examination. Limited examinations for reoccurring indications may be performed as noted. The reflux portion of the exam is performed with the patient in reverse Trendelenburg.  +---------+---------------+---------+-----------+----------+--------------+ RIGHT    CompressibilityPhasicitySpontaneityPropertiesThrombus Aging +---------+---------------+---------+-----------+----------+--------------+ CFV      Full           Yes      Yes                                 +---------+---------------+---------+-----------+----------+--------------+ SFJ      Full                                                        +---------+---------------+---------+-----------+----------+--------------+ FV Prox  Full                                                        +---------+---------------+---------+-----------+----------+--------------+ FV Mid   Full                                                        +---------+---------------+---------+-----------+----------+--------------+ FV DistalFull                                                        +---------+---------------+---------+-----------+----------+--------------+ PFV      Full                                                        +---------+---------------+---------+-----------+----------+--------------+ POP      Full           Yes      Yes                                  +---------+---------------+---------+-----------+----------+--------------+ PTV      Full                                                        +---------+---------------+---------+-----------+----------+--------------+ PERO     Full                                                        +---------+---------------+---------+-----------+----------+--------------+   +---------+---------------+---------+-----------+----------+--------------+  LEFT     CompressibilityPhasicitySpontaneityPropertiesThrombus Aging +---------+---------------+---------+-----------+----------+--------------+ CFV      Full           Yes      Yes                                 +---------+---------------+---------+-----------+----------+--------------+ SFJ      Full                                                        +---------+---------------+---------+-----------+----------+--------------+ FV Prox  Full                                                        +---------+---------------+---------+-----------+----------+--------------+ FV Mid   Full                                                        +---------+---------------+---------+-----------+----------+--------------+ FV Distal               Yes      Yes                                 +---------+---------------+---------+-----------+----------+--------------+ PFV      Full                                                        +---------+---------------+---------+-----------+----------+--------------+ POP      Full           Yes      Yes                                 +---------+---------------+---------+-----------+----------+--------------+ PTV      Full                                                        +---------+---------------+---------+-----------+----------+--------------+ PERO     Full                                                         +---------+---------------+---------+-----------+----------+--------------+     Summary: RIGHT: - There is no evidence of deep vein thrombosis in the lower extremity. However, portions of this examination were limited- see technologist comments above.  - No cystic structure found in the popliteal fossa.  LEFT: - There is no evidence of deep  vein thrombosis in the lower extremity. However, portions of this examination were limited- see technologist comments above.  - No cystic structure found in the popliteal fossa.  *See table(s) above for measurements and observations. Electronically signed by Monica Martinez MD on 09/05/2019 at 4:06:56 PM.    Final     Procedures Procedures (including critical care time)  Medications Ordered in ED Medications - No data to display  ED Course  I have reviewed the triage vital signs and the nursing notes.  Pertinent labs & imaging results that were available during my care of the patient were reviewed by me and considered in my medical decision making (see chart for details).  64 year old female presents essentially for a DVT study.  She states that she has been having left leg cramps and intermittent swelling of the right leg.  She told her provider and they wanted her to have a DVT study done because she is having gastric bypass surgery next week.  On exam there is no obvious swelling.  There is mild tenderness of the left calf.  She is neurovascularly intact.  Pain does not sound radicular in nature although she mentions that she has some issues with chronic back pain.  Will order DVT study and i-STAT Chem-8 to rule out electrolyte abnormality since she has been following a liquid diet for the past week  DVT study is negative.  Labs are normal.  Patient given reassurance and advised to follow-up with her provider.  MDM Rules/Calculators/A&P                       Final Clinical Impression(s) / ED Diagnoses Final diagnoses:  Leg cramp  Leg swelling    Rx  / DC Orders ED Discharge Orders    None       Recardo Evangelist, PA-C 09/05/19 [place], [place], DO 09/06/19 [phone]

## 2019-09-05 NOTE — Progress Notes (Signed)
Bilateral lower extremity venous duplex has been completed. Preliminary results can be found in CV Proc through chart review.  Results were given to Terance Hart PA.  09/05/19 3:56 PM Olen Cordial RVT

## 2019-09-09 ENCOUNTER — Other Ambulatory Visit: Payer: Self-pay

## 2019-09-09 ENCOUNTER — Ambulatory Visit (HOSPITAL_COMMUNITY): Payer: Federal, State, Local not specified - PPO | Admitting: Psychology

## 2019-09-11 ENCOUNTER — Telehealth (INDEPENDENT_AMBULATORY_CARE_PROVIDER_SITE_OTHER): Payer: Federal, State, Local not specified - PPO | Admitting: Psychology

## 2019-09-11 ENCOUNTER — Other Ambulatory Visit: Payer: Self-pay

## 2019-09-11 DIAGNOSIS — F411 Generalized anxiety disorder: Secondary | ICD-10-CM

## 2019-09-11 DIAGNOSIS — F330 Major depressive disorder, recurrent, mild: Secondary | ICD-10-CM

## 2019-09-11 NOTE — Progress Notes (Signed)
Virtual Visit via Video Note  I connected with Rebecca Hahn on 09/11/19 at 11:00 AM EDT by a video enabled telemedicine application and verified that I am speaking with the correct person using two identifiers.   I discussed the limitations of evaluation and management by telemedicine and the availability of in person appointments. The patient expressed understanding and agreed to proceed.    I discussed the assessment and treatment plan with the patient. The patient was provided an opportunity to ask questions and all were answered. The patient agreed with the plan and demonstrated an understanding of the instructions.   The patient was advised to call back or seek an in-person evaluation if the symptoms worsen or if the condition fails to improve as anticipated.  I provided 48 minutes of non-face-to-face time during this encounter.   Rebecca Hahn Rebecca Hahn    THERAPIST PROGRESS NOTE  Session Time: 11am-11.48am  Participation Level: Active  Behavioral Response: Well GroomedAlertaffect bright  Type of Therapy: Individual Therapy  Treatment Goals addressed: Diagnosis: MDD, GAD and goal 1.  Interventions: CBT and Strength-based  Summary: Rebecca Hahn is a 64 y.o. female who presents with affect bright.  Pt reported today has been a positive day, after rough day yesterday.  Pt reported that she has been on a liquid diet for 2.5 weeks in preparation for her gastric bypass surgery on 09/15/19.  Pt acknowledged how that has been difficult and that support of friend was beneficial yesterday.  Pt discussed how she had good night sleep, woke refreshed and feeling better.  Pt discussed her use of coping skills w/ her faith, her meditation, her journaling and support system. Pt discussed her preparations and ways she plans for coping following surgery.  Pt discussed experience of her kids and grandkids coming together at her house for Easter and how felt presence of holy spirit, peace and positive  emotions from.     Suicidal/Homicidal: Nowithout intent/plan  Therapist Response: Assessed pt current functioning per pt report.  Processed w/pt coping w/ her struggles in preparation for surgery- discussed ways she has been able to cope through and practice of acceptance and reframing assisted.  Explored w/pt her plan for coping and connections w/ supports.    Plan: Return again in 3 weeks, via caregility.  Counselor will call if pt having difficulty connecting to new platform for video visit.  F/u as scheduled w/ dr. Rene Kocher.   Diagnosis: MDD, Virl Son, [facility] 09/11/2019

## 2019-10-07 ENCOUNTER — Ambulatory Visit (HOSPITAL_COMMUNITY): Payer: Federal, State, Local not specified - PPO | Admitting: Psychology

## 2019-10-08 ENCOUNTER — Encounter (HOSPITAL_COMMUNITY): Payer: Self-pay | Admitting: Psychology

## 2019-10-08 NOTE — Progress Notes (Signed)
Rebecca Hahn is a 64 y.o. female patient who is discharged from counseling.  Pt was scheduled for f/u on 10/07/19 and counselor reached out prior to inform of counselor's departure from practice on 10/09/19 and options to continue w/ counseling.  Pt responded informing that she is no choosing to continue counseling at this time.  Pt may return for counseling as needed in the future.       Forde Radon, [facility]

## 2019-10-15 DIAGNOSIS — R748 Abnormal levels of other serum enzymes: Secondary | ICD-10-CM | POA: Diagnosis not present

## 2019-10-15 DIAGNOSIS — K219 Gastro-esophageal reflux disease without esophagitis: Secondary | ICD-10-CM | POA: Diagnosis not present

## 2019-10-16 DIAGNOSIS — J342 Deviated nasal septum: Secondary | ICD-10-CM | POA: Diagnosis not present

## 2019-10-21 DIAGNOSIS — L603 Nail dystrophy: Secondary | ICD-10-CM | POA: Diagnosis not present

## 2019-10-30 DIAGNOSIS — Z0000 Encounter for general adult medical examination without abnormal findings: Secondary | ICD-10-CM | POA: Diagnosis not present

## 2019-12-01 DIAGNOSIS — F332 Major depressive disorder, recurrent severe without psychotic features: Secondary | ICD-10-CM | POA: Diagnosis not present

## 2020-01-28 ENCOUNTER — Other Ambulatory Visit: Payer: Self-pay | Admitting: Pain Medicine

## 2020-01-28 ENCOUNTER — Ambulatory Visit
Admission: RE | Admit: 2020-01-28 | Discharge: 2020-01-28 | Disposition: A | Discharge status: Home / Self Care | Payer: Medicare Other | Source: Ambulatory Visit | Attending: Pain Medicine | Admitting: Pain Medicine

## 2020-01-28 DIAGNOSIS — M545 Low back pain, unspecified: Secondary | ICD-10-CM

## 2020-01-28 DIAGNOSIS — G8929 Other chronic pain: Secondary | ICD-10-CM

## 2020-01-28 DIAGNOSIS — M25559 Pain in unspecified hip: Secondary | ICD-10-CM

## 2020-05-31 ENCOUNTER — Other Ambulatory Visit: Payer: Self-pay

## 2020-05-31 ENCOUNTER — Encounter (HOSPITAL_COMMUNITY): Payer: Self-pay

## 2020-05-31 DIAGNOSIS — Z5321 Procedure and treatment not carried out due to patient leaving prior to being seen by health care provider: Secondary | ICD-10-CM | POA: Insufficient documentation

## 2020-05-31 DIAGNOSIS — M6281 Muscle weakness (generalized): Secondary | ICD-10-CM | POA: Insufficient documentation

## 2020-05-31 NOTE — ED Triage Notes (Addendum)
Pt BIB [facility] for c/o of bilateral leg weakness, increasingly worse in left. Pt has hx of degenerative disease, this is a chronic problem for her. Pt stated she was standing and had to sit down before she fell, denies fall. Pt able to stand and transfer with [facility]. Pt states the pain/numbness/weakness that she has in lower extremities is chronic and she sees her PCP for it.   CBG 123

## 2020-05-31 NOTE — ED Notes (Signed)
Pt informed the screeners to notify staff that she is having weakness/numbness in her legs.

## 2020-06-01 ENCOUNTER — Emergency Department (HOSPITAL_COMMUNITY)
Admission: EM | Admit: 2020-06-01 | Discharge: 2020-06-01 | Disposition: A | Discharge status: Home / Self Care | Payer: Medicare Other | Attending: Emergency Medicine | Admitting: Emergency Medicine

## 2020-06-01 NOTE — ED Notes (Signed)
Called 3x for room placement. Eloped from waiting area.  

## 2020-06-01 NOTE — ED Notes (Signed)
Pt notified registration that she would be LWBS and to take her off the list.

## 2020-06-28 DIAGNOSIS — M48062 Spinal stenosis, lumbar region with neurogenic claudication: Secondary | ICD-10-CM | POA: Diagnosis not present

## 2020-07-01 ENCOUNTER — Other Ambulatory Visit: Payer: Self-pay | Admitting: Neurosurgery

## 2020-07-02 DIAGNOSIS — M1990 Unspecified osteoarthritis, unspecified site: Secondary | ICD-10-CM | POA: Diagnosis not present

## 2020-07-02 DIAGNOSIS — M79643 Pain in unspecified hand: Secondary | ICD-10-CM | POA: Diagnosis not present

## 2020-07-02 DIAGNOSIS — Z79899 Other long term (current) drug therapy: Secondary | ICD-10-CM | POA: Diagnosis not present

## 2020-07-02 DIAGNOSIS — M3500 Sicca syndrome, unspecified: Secondary | ICD-10-CM | POA: Diagnosis not present

## 2020-07-07 ENCOUNTER — Telehealth: Payer: Self-pay | Admitting: Neurology

## 2020-07-07 DIAGNOSIS — I10 Essential (primary) hypertension: Secondary | ICD-10-CM | POA: Diagnosis not present

## 2020-07-07 DIAGNOSIS — E785 Hyperlipidemia, unspecified: Secondary | ICD-10-CM | POA: Diagnosis not present

## 2020-07-07 DIAGNOSIS — E119 Type 2 diabetes mellitus without complications: Secondary | ICD-10-CM | POA: Diagnosis not present

## 2020-07-07 NOTE — Telephone Encounter (Signed)
I received an office visit note from Dr. Conchita Paris.  Patient was seen on 06/28/2020 for back pain, this was a telehealth visit.  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  Plan is to proceed with a lumbar decompression and fusion including placement of interbody cages, interbody arthrodesis, posterior nonsegmental instrumentation from L4-L5 with cortical pedicle screws and use of bone autograft, allograft and BMP.  FYI, nothing further needed.

## 2020-07-08 DIAGNOSIS — M48061 Spinal stenosis, lumbar region without neurogenic claudication: Secondary | ICD-10-CM | POA: Diagnosis not present

## 2020-07-08 DIAGNOSIS — M47816 Spondylosis without myelopathy or radiculopathy, lumbar region: Secondary | ICD-10-CM | POA: Diagnosis not present

## 2020-07-09 ENCOUNTER — Other Ambulatory Visit: Payer: Self-pay | Admitting: Family Medicine

## 2020-07-09 DIAGNOSIS — Z1231 Encounter for screening mammogram for malignant neoplasm of breast: Secondary | ICD-10-CM

## 2020-07-13 DIAGNOSIS — M3505 Sjogren syndrome with inflammatory arthritis: Secondary | ICD-10-CM | POA: Diagnosis not present

## 2020-07-13 DIAGNOSIS — Z0000 Encounter for general adult medical examination without abnormal findings: Secondary | ICD-10-CM | POA: Diagnosis not present

## 2020-07-13 DIAGNOSIS — E1165 Type 2 diabetes mellitus with hyperglycemia: Secondary | ICD-10-CM | POA: Diagnosis not present

## 2020-07-13 DIAGNOSIS — M3500 Sicca syndrome, unspecified: Secondary | ICD-10-CM | POA: Diagnosis not present

## 2020-07-19 DIAGNOSIS — M545 Low back pain, unspecified: Secondary | ICD-10-CM | POA: Diagnosis not present

## 2020-07-28 DIAGNOSIS — R2689 Other abnormalities of gait and mobility: Secondary | ICD-10-CM | POA: Diagnosis not present

## 2020-07-28 DIAGNOSIS — Z0189 Encounter for other specified special examinations: Secondary | ICD-10-CM | POA: Diagnosis not present

## 2020-07-28 DIAGNOSIS — M545 Low back pain, unspecified: Secondary | ICD-10-CM | POA: Diagnosis not present

## 2020-08-05 NOTE — Progress Notes (Signed)
Surgical Instructions    Your procedure is scheduled on Tuesday, March 22nd.  Report to [facility] Main Entrance "A" at 05:30 A.M., then check in with the Admitting office.  Call this number if you have problems the morning of surgery:  [phone]   If you have any questions prior to your surgery date call [phone]: Open Monday-Friday 8am-4pm    Remember:  Do not eat or drink after midnight the night before your surgery   On the day of your surgery you can take with a small sip of water  acetaminophen (TYLENOL) allopurinol (ZYLOPRIM) clonazePAM (KLONOPIN) gabapentin (NEURONTIN) loratadine (CLARITIN) meclizine (ANTIVERT omeprazole (PRILOSEC) leflunomide (ARAVA) Lifitegrast-eye drops  Take these medicine if needed:  albuterol (PROVENTIL HFA;VENTOLIN HFA) - bring your inhaler with you Carboxymethylcellulose Sodium fluticasone (FLONASE) ketotifen (ZADITOR promethazine (PHENERGAN) SUMAtriptan (IMITREX)  Follow your surgeon's instructions on when to stop Aspirin.  If no instructions were given by your surgeon then you will need to call the office to get those instructions.    As of today, STOP taking any Aleve, Naproxen, Ibuprofen, Motrin, Advil, Goody's, BC's, all herbal medications, fish oil, and all vitamins.                  WHAT DO I DO ABOUT MY DIABETES MEDICATION?   Marland Kitchen Do not take oral diabetes medicines (pills) the morning of surgery.         THE MORNING OF SURGERY - do not take Metformin (Glucophage)   HOW TO MANAGE YOUR DIABETES BEFORE AND AFTER SURGERY  Why is it important to control my blood sugar before and after surgery? . Improving blood sugar levels before and after surgery helps healing and can limit problems. . A way of improving blood sugar control is eating a healthy diet by: o  Eating less sugar and carbohydrates o  Increasing activity/exercise o  Talking with your doctor about reaching your blood sugar goals . High blood sugars (greater  than 180 mg/dL) can raise your risk of infections and slow your recovery, so you will need to focus on controlling your diabetes during the weeks before surgery. . Make sure that the doctor who takes care of your diabetes knows about your planned surgery including the date and location.  How do I manage my blood sugar before surgery? . Check your blood sugar at least 4 times a day, starting 2 days before surgery, to make sure that the level is not too high or low. . Check your blood sugar the morning of your surgery when you wake up and every 2 hours until you get to the Short Stay unit. o If your blood sugar is less than 70 mg/dL, you will need to treat for low blood sugar: - Do not take insulin. - Treat a low blood sugar (less than 70 mg/dL) with  cup of clear juice (cranberry or apple), 4 glucose tablets, OR glucose gel. - Recheck blood sugar in 15 minutes after treatment (to make sure it is greater than 70 mg/dL). If your blood sugar is not greater than 70 mg/dL on recheck, call [phone] for further instructions. . Report your blood sugar to the short stay nurse when you get to Short Stay.  . If you are admitted to the hospital after surgery: o Your blood sugar will be checked by the staff and you will probably be given insulin after surgery (instead of oral diabetes medicines) to make sure you have good blood sugar levels. o The goal for blood  sugar control after surgery is 80-180 mg/dL.                      Do not wear jewelry, make up, or nail polish            Do not wear lotions, powders, perfumes/colognes, or deodorant.            Do not shave 48 hours prior to surgery.             Do not bring valuables to the hospital.            [facility] is not responsible for any belongings or valuables.  Do NOT Smoke (Tobacco/Vaping) or drink Alcohol 24 hours prior to your procedure If you use a CPAP at night, you may bring all equipment for your overnight stay.   Contacts, glasses,  dentures or bridgework may not be worn into surgery, please bring cases for these belongings   For patients admitted to the hospital, discharge time will be determined by your treatment team.   Patients discharged the day of surgery will not be allowed to drive home, and someone needs to stay with them for 24 hours.    Special instructions:   [facility]- Preparing For Surgery  Before surgery, you can play an important role. Because skin is not sterile, your skin needs to be as free of germs as possible. You can reduce the number of germs on your skin by washing with CHG (chlorahexidine gluconate) Soap before surgery.  CHG is an antiseptic cleaner which kills germs and bonds with the skin to continue killing germs even after washing.    Oral Hygiene is also important to reduce your risk of infection.  Remember - BRUSH YOUR TEETH THE MORNING OF SURGERY WITH YOUR REGULAR TOOTHPASTE  Please do not use if you have an allergy to CHG or antibacterial soaps. If your skin becomes reddened/irritated stop using the CHG.  Do not shave (including legs and underarms) for at least 48 hours prior to first CHG shower. It is OK to shave your face.  Please follow these instructions carefully.   1. Shower the NIGHT BEFORE SURGERY and the MORNING OF SURGERY  2. If you chose to wash your hair, wash your hair first as usual with your normal shampoo.  3. After you shampoo, rinse your hair and body thoroughly to remove the shampoo.  4. Use CHG Soap as you would any other liquid soap. You can apply CHG directly to the skin and wash gently with a scrungie or a clean washcloth.   5. Apply the CHG Soap to your body ONLY FROM THE NECK DOWN.  Do not use on open wounds or open sores. Avoid contact with your eyes, ears, mouth and genitals (private parts). Wash Face and genitals (private parts)  with your normal soap.   6. Wash thoroughly, paying special attention to the area where your surgery will be  performed.  7. Thoroughly rinse your body with warm water from the neck down.  8. DO NOT shower/wash with your normal soap after using and rinsing off the CHG Soap.  9. Pat yourself dry with a CLEAN TOWEL.  10. Wear CLEAN PAJAMAS to bed the night before surgery  11. Place CLEAN SHEETS on your bed the night before your surgery  12. DO NOT SLEEP WITH PETS.   Day of Surgery: Shower with CHG soap Wear Clean/Comfortable clothing the morning of surgery Do not apply any deodorants/lotions.  Remember to brush your teeth WITH YOUR REGULAR TOOTHPASTE.   Please read over the following fact sheets that you were given.

## 2020-08-06 ENCOUNTER — Encounter (HOSPITAL_COMMUNITY)
Admission: RE | Admit: 2020-08-06 | Discharge: 2020-08-06 | Disposition: A | Discharge status: Home / Self Care | Payer: Medicare Other | Source: Ambulatory Visit | Attending: Neurosurgery | Admitting: Neurosurgery

## 2020-08-06 ENCOUNTER — Encounter (HOSPITAL_COMMUNITY): Payer: Self-pay

## 2020-08-06 ENCOUNTER — Other Ambulatory Visit: Payer: Self-pay

## 2020-08-06 DIAGNOSIS — Z79899 Other long term (current) drug therapy: Secondary | ICD-10-CM | POA: Diagnosis not present

## 2020-08-06 DIAGNOSIS — M48062 Spinal stenosis, lumbar region with neurogenic claudication: Secondary | ICD-10-CM | POA: Diagnosis not present

## 2020-08-06 DIAGNOSIS — E119 Type 2 diabetes mellitus without complications: Secondary | ICD-10-CM | POA: Insufficient documentation

## 2020-08-06 DIAGNOSIS — H409 Unspecified glaucoma: Secondary | ICD-10-CM | POA: Insufficient documentation

## 2020-08-06 DIAGNOSIS — Q018 Encephalocele of other sites: Secondary | ICD-10-CM | POA: Diagnosis not present

## 2020-08-06 DIAGNOSIS — E785 Hyperlipidemia, unspecified: Secondary | ICD-10-CM | POA: Insufficient documentation

## 2020-08-06 DIAGNOSIS — Z7984 Long term (current) use of oral hypoglycemic drugs: Secondary | ICD-10-CM | POA: Diagnosis not present

## 2020-08-06 DIAGNOSIS — D649 Anemia, unspecified: Secondary | ICD-10-CM | POA: Insufficient documentation

## 2020-08-06 DIAGNOSIS — M3500 Sicca syndrome, unspecified: Secondary | ICD-10-CM | POA: Insufficient documentation

## 2020-08-06 DIAGNOSIS — I10 Essential (primary) hypertension: Secondary | ICD-10-CM | POA: Insufficient documentation

## 2020-08-06 DIAGNOSIS — Z9989 Dependence on other enabling machines and devices: Secondary | ICD-10-CM | POA: Diagnosis not present

## 2020-08-06 DIAGNOSIS — Z20822 Contact with and (suspected) exposure to covid-19: Secondary | ICD-10-CM | POA: Diagnosis not present

## 2020-08-06 DIAGNOSIS — G4733 Obstructive sleep apnea (adult) (pediatric): Secondary | ICD-10-CM | POA: Diagnosis not present

## 2020-08-06 DIAGNOSIS — R419 Unspecified symptoms and signs involving cognitive functions and awareness: Secondary | ICD-10-CM | POA: Insufficient documentation

## 2020-08-06 DIAGNOSIS — Z01818 Encounter for other preprocedural examination: Secondary | ICD-10-CM | POA: Insufficient documentation

## 2020-08-06 DIAGNOSIS — Z7982 Long term (current) use of aspirin: Secondary | ICD-10-CM | POA: Insufficient documentation

## 2020-08-06 DIAGNOSIS — H9190 Unspecified hearing loss, unspecified ear: Secondary | ICD-10-CM | POA: Insufficient documentation

## 2020-08-06 DIAGNOSIS — K219 Gastro-esophageal reflux disease without esophagitis: Secondary | ICD-10-CM | POA: Diagnosis not present

## 2020-08-06 DIAGNOSIS — Z6841 Body Mass Index (BMI) 40.0 and over, adult: Secondary | ICD-10-CM | POA: Diagnosis not present

## 2020-08-06 DIAGNOSIS — L659 Nonscarring hair loss, unspecified: Secondary | ICD-10-CM | POA: Diagnosis not present

## 2020-08-06 HISTORY — DX: Hyperlipidemia, unspecified: E78.5

## 2020-08-06 HISTORY — DX: Morbid (severe) obesity due to excess calories: E66.01

## 2020-08-06 HISTORY — DX: Unspecified hearing loss, unspecified ear: H91.90

## 2020-08-06 HISTORY — DX: Nonscarring hair loss, unspecified: L65.9

## 2020-08-06 HISTORY — DX: Headache, unspecified: R51.9

## 2020-08-06 HISTORY — DX: Unspecified mental disorder due to known physiological condition: F09

## 2020-08-06 HISTORY — DX: Polyp of stomach and duodenum: K31.7

## 2020-08-06 HISTORY — DX: Essential (primary) hypertension: I10

## 2020-08-06 LAB — CBC
HCT: 37.2 % (ref 36.0–46.0)
Hemoglobin: 11.6 g/dL — ABNORMAL LOW (ref 12.0–15.0)
MCH: 29.1 pg (ref 26.0–34.0)
MCHC: 31.2 g/dL (ref 30.0–36.0)
MCV: 93.5 fL (ref 80.0–100.0)
Platelets: 313 10*3/uL (ref 150–400)
RBC: 3.98 MIL/uL (ref 3.87–5.11)
RDW: 16 % — ABNORMAL HIGH (ref 11.5–15.5)
WBC: 8.2 10*3/uL (ref 4.0–10.5)
nRBC: 0 % (ref 0.0–0.2)

## 2020-08-06 LAB — SURGICAL PCR SCREEN
MRSA, PCR: NEGATIVE
Staphylococcus aureus: NEGATIVE

## 2020-08-06 LAB — SARS CORONAVIRUS 2 (TAT 6-24 HRS): SARS Coronavirus 2: NEGATIVE

## 2020-08-06 LAB — BASIC METABOLIC PANEL
Anion gap: 6 (ref 5–15)
BUN: 12 mg/dL (ref 8–23)
CO2: 28 mmol/L (ref 22–32)
Calcium: 9.2 mg/dL (ref 8.9–10.3)
Chloride: 105 mmol/L (ref 98–111)
Creatinine, Ser: 0.77 mg/dL (ref 0.44–1.00)
GFR, Estimated: >60 mL/min (ref >60)
Glucose, Bld: 92 mg/dL (ref 70–99)
Potassium: 4.3 mmol/L (ref 3.5–5.1)
Sodium: 139 mmol/L (ref 135–145)

## 2020-08-06 LAB — GLUCOSE, CAPILLARY: Glucose-Capillary: 92 mg/dL (ref 70–99)

## 2020-08-06 LAB — TYPE AND SCREEN
ABO/RH(D): B POS
Antibody Screen: NEGATIVE

## 2020-08-06 NOTE — Progress Notes (Signed)
PCP -Dr. Georgianne Fick   Cardiologist - denies  Chest x-ray - n/a EKG - 08/06/20 Stress Test - 5+ years ago; [facility], [place]. ECHO - denies Cardiac Cath - denies  Sleep Study -OSA+  CPAP - uses nightly; setting 4.0  Does not check CBG at home nor has the materials to do so.  Last A1C-6.7 drawn on 07/28/20 from the [place]. Pt will bring a copy in on DOS. [facility] in [facility]. CBG at PAT 92  Blood Thinner Instructions:n/a Aspirin Instructions:LD 08/02/20  COVID TEST- 08/06/20; done in PAT. Pt aware of quarantine guidelines.   Anesthesia review: Yes, pending stress test documents.    Patient denies shortness of breath, fever, cough and chest pain at PAT appointment   All instructions explained to the patient, with a verbal understanding of the material. Patient agrees to go over the instructions while at home for a better understanding. Patient also instructed to self quarantine after being tested for COVID-19. The opportunity to ask questions was provided.

## 2020-08-09 MED ORDER — VANCOMYCIN HCL 1500 MG/300ML IV SOLN
1500.0000 mg | INTRAVENOUS | Status: DC
  Filled 2020-08-09 (×2): qty 300

## 2020-08-09 NOTE — Progress Notes (Signed)
Anesthesia Chart Review:  Case: [id number] Date/Time: 08/10/20 0715   Procedure: PLIF L45 (N/A ) - 3C   Anesthesia type: General   Pre-op diagnosis: SPINAL STENOSIS OF LUMBAR REGION WITH NEUROGENIC CLAUDICATION   Location: [facility] ROOM 19 / [facility]   Surgeons: Lisbeth Renshaw, MD      DISCUSSION: Patient is a 65 year old female scheduled for the above procedure.  History includes former smoker, DM2, OSA (uses CPAP), anemia, GERD, glaucoma, HTN, HLD, migraines, alopecia, hearing loss, cognitive dysfunction, Sjogren's syndrome, chronic bilateral temporal lobe encephaloceles (by 08/25/19 brain MRI).  S/p percutaneous lumber decompression L4-5 03/16/20 ([facility]). BMI is consistent with morbid obesity.  She reported A1c 6.7% on 07/28/20 for the [facility] and was instructed to bring copy with her on the day of surgery.   Last ASA 08/02/20.  08/06/2020 presurgical COVID-19 test negative.  Anesthesia team to evaluate on the day of surgery.   VS: BP (!) 143/88   Pulse 95   Temp 36.9 C (Oral)   Resp 20   Ht 5\' 6"  (1.676 m)   Wt 120.7 kg   SpO2 98%   BMI 42.93 kg/m   PROVIDERS: , MD is PCP    LABS: Labs reviewed: Acceptable for surgery. (all labs ordered are listed, but only abnormal results are displayed)  Labs Reviewed  CBC - Abnormal; Notable for the following components:      Result Value   Hemoglobin 11.6 (*)    RDW 16.0 (*)    All other components within normal limits  SURGICAL PCR SCREEN  SARS CORONAVIRUS 2 (TAT 6-24 HRS)  GLUCOSE, CAPILLARY  BASIC METABOLIC PANEL  TYPE AND SCREEN     IMAGES: MRI L-spine 01/28/20: IMPRESSION: 1. Diffuse disc and facet degeneration as described. Facet osteoarthritis is particularly advanced at L3-4 and below. 2. L4-5 moderate to advanced spinal stenosis with asymmetric right foraminal impingement. 3. L3-4 mild to moderate spinal stenosis. 4. Low conus termination at the L2-3 disc space, but no tethering mass  seen.   EKG: 08/06/20: Normal sinus rhythm Normal ECG No significant change since last tracing Confirmed by 08/08/20 (Olga Millers) on 08/06/2020 12:08:32 PM    CV: She reported a previous stress test 5+ years ago with [facility] In [facility] Everywhere, records indicate she underwent BLE arterial physiologic testing with exercise on 02/28/10 for evaluation of claudication symptoms. Results showed: - At rest, all waveforms are triphasic. The right ankle brachial index is 1.08.Left ankle brachial index is 1.10.  - Patient then underwent exercise testing. After one minute post exercise, the patient had a decrease in ankle brachial index of 0.82 on the right and 0.77 on the left. After ten minutes, patient returned to baseline values.  IMPRESSION:  1. Normal perfusion is present in bilateral lower extremities at rest.  2. Moderate ischemic response is noted in bilateral lower extremities with exercise.  Follow-up test on 09/28/15 showed: CONCLUSIONS:  Normal lower extremity arterial examination at rest on right side.  Exercise elicited an ischemic response in the right lower extremity (with/without) suggestion of inflow involvement.   Normal bilateral lower extremity arterial examination both at rest and following exercise.   Past Medical History:  Diagnosis Date  . Agenesis of gallbladder   . Alopecia   . Anemia   . Anxiety   . Cognitive dysfunction   . Colon polyp   . DDD (degenerative disc disease), lumbar   . Depression   . Diabetes mellitus, type II (HCC)   .  Gastric polyp   . GERD (gastroesophageal reflux disease)   . Glaucoma   . Gout   . Headache    migraines  . Hearing loss   . Hyperlipidemia   . Hypertension   . Lumbar radiculopathy   . Lumbar radiculopathy   . Morbid obesity (HCC)   . Osteoarthritis   . Sjoegren syndrome   . Sleep apnea    CPAP  . Vertigo     Past Surgical History:  Procedure Laterality Date  . CESAREAN SECTION    .  CHOLECYSTECTOMY    . TUBAL LIGATION      MEDICATIONS: . acetaminophen (TYLENOL) 500 MG tablet  . albuterol (PROVENTIL HFA;VENTOLIN HFA) 108 (90 Base) MCG/ACT inhaler  . allopurinol (ZYLOPRIM) 100 MG tablet  . aspirin 81 MG EC tablet  . Biotin w/ Vitamins C & E (HAIR SKIN & NAILS GUMMIES PO)  . Carboxymethylcellulose Sodium 1 % GEL  . clonazePAM (KLONOPIN) 0.5 MG tablet  . fluticasone (FLONASE) 50 MCG/ACT nasal spray  . gabapentin (NEURONTIN) 100 MG capsule  . HYPOCHLOROUS ACID EX  . ketotifen (ZADITOR) 0.025 % ophthalmic solution  . leflunomide (ARAVA) 10 MG tablet  . Lifitegrast 5 % SOLN  . loratadine (CLARITIN) 10 MG tablet  . meclizine (ANTIVERT) 12.5 MG tablet  . melatonin 3 MG TABS tablet  . metFORMIN (GLUCOPHAGE) 500 MG tablet  . Multiple Vitamins-Minerals (CENTRUM SILVER 50+WOMEN) TABS  . naproxen (NAPROSYN) 500 MG tablet  . omeprazole (PRILOSEC) 20 MG capsule  . promethazine (PHENERGAN) 25 MG tablet  . rosuvastatin (CRESTOR) 20 MG tablet  . sertraline (ZOLOFT) 100 MG tablet  . SUMAtriptan (IMITREX) 100 MG tablet  . traZODone (DESYREL) 100 MG tablet   No current facility-administered medications for this encounter.    Shonna Chock, PA-C Surgical Short Stay/Anesthesiology [facility] Phone [phone] [facility] Phone [phone] 08/09/2020 10:03 AM

## 2020-08-09 NOTE — Anesthesia Preprocedure Evaluation (Addendum)
Anesthesia Evaluation  Patient identified by MRN, date of birth, ID band Patient awake    Reviewed: Allergy & Precautions, NPO status , Patient's Chart, lab work & pertinent test results  Airway Mallampati: II  TM Distance: >3 FB Neck ROM: Full    Dental  (+) Teeth Intact, Dental Advisory Given   Pulmonary sleep apnea and Continuous Positive Airway Pressure Ventilation , former smoker,    Pulmonary exam normal breath sounds clear to auscultation       Cardiovascular hypertension, Normal cardiovascular exam Rhythm:Regular Rate:Normal     Neuro/Psych  Headaches, PSYCHIATRIC DISORDERS Anxiety Depression SPINAL STENOSIS OF LUMBAR REGION WITH NEUROGENIC CLAUDICATION  Neuromuscular disease    GI/Hepatic Neg liver ROS, GERD  Medicated,  Endo/Other  diabetes, Type 2, Oral Hypoglycemic AgentsMorbid obesity  Renal/GU negative Renal ROS     Musculoskeletal  (+) Arthritis , Sjoegren syndrome   Abdominal   Peds  Hematology  (+) Blood dyscrasia, anemia ,   Anesthesia Other Findings   Reproductive/Obstetrics                            Anesthesia Physical Anesthesia Plan  ASA: III  Anesthesia Plan: General   Post-op Pain Management:    Induction: Intravenous  PONV Risk Score and Plan: 3 and Midazolam, Dexamethasone and Ondansetron  Airway Management Planned: Oral ETT  Additional Equipment:   Intra-op Plan:   Post-operative Plan: Extubation in OR  Informed Consent: I have reviewed the patients History and Physical, chart, labs and discussed the procedure including the risks, benefits and alternatives for the proposed anesthesia with the patient or authorized representative who has indicated his/her understanding and acceptance.     Dental advisory given  Plan Discussed with: CRNA  Anesthesia Plan Comments: (2nd PIV)      Anesthesia Quick Evaluation

## 2020-08-09 NOTE — H&P (Signed)
Chief Complaint   No chief complaint on file.   HPI   HPI: Rebecca Hahn is a 65 y.o. female with long standing history of chronic lower back and bilateral leg pain. Symptoms are typically brought on with standing and ambulation and at least partially relieved with rest.  She underwent MRI of her lumbar spine which revealed moderate to severe multifactorial stenosis at L4-5.  She has failed reasonable conservative treatment including epidural steroid injection, physical therapy and p.o. medications.  She presents today for lumbar decompression and fusion.  She is without any concerns today.  There are no problems to display for this patient.   PMH: Past Medical History:  Diagnosis Date   Agenesis of gallbladder    Alopecia    Anemia    Anxiety    Cognitive dysfunction    Colon polyp    DDD (degenerative disc disease), lumbar    Depression    Diabetes mellitus, type II (HCC)    Gastric polyp    GERD (gastroesophageal reflux disease)    Glaucoma    Gout    Headache    migraines   Hearing loss    Hyperlipidemia    Hypertension    Lumbar radiculopathy    Lumbar radiculopathy    Morbid obesity (HCC)    Osteoarthritis    Sjoegren syndrome    Sleep apnea    CPAP   Vertigo     PSH: Past Surgical History:  Procedure Laterality Date   CESAREAN SECTION     CHOLECYSTECTOMY     TUBAL LIGATION      No medications prior to admission.    SH: Social History   Tobacco Use   Smoking status: Former Smoker    Types: Cigarettes    Quit date: 1978    Years since quitting: 44.2   Smokeless tobacco: Never Used   Tobacco comment: in teen years  Vaping Use   Vaping Use: Never used  Substance Use Topics   Alcohol use: No   Drug use: No    MEDS: Prior to Admission medications   Medication Sig Start Date End Date Taking? Authorizing Provider  acetaminophen (TYLENOL) 500 MG tablet Take 1,000 mg by mouth in the morning and at bedtime.    Yes [provider]  albuterol (PROVENTIL HFA;VENTOLIN HFA) 108 (90 Base) MCG/ACT inhaler Inhale 1-2 puffs into the lungs every 6 (six) hours as needed for wheezing or shortness of breath. 05/18/17  Yes Lurene Shadow, PA-C  allopurinol (ZYLOPRIM) 100 MG tablet Take 200 mg by mouth daily.  12/31/16  Yes [provider]  aspirin 81 MG EC tablet Chew 81 mg by mouth daily.   Yes [provider]  Biotin w/ Vitamins C & E (HAIR SKIN & NAILS GUMMIES PO) Take 2 each by mouth daily.   Yes [provider]  Carboxymethylcellulose Sodium 1 % GEL Place 1 drop into both eyes 4 (four) times daily as needed (dry eyes).   Yes [provider]  clonazePAM (KLONOPIN) 0.5 MG tablet Take 1 tablet (0.5 mg total) by mouth 2 (two) times daily. Patient taking differently: Take 1 mg by mouth every morning. 06/29/17  Yes Eksir, Bo Mcclintock, MD  fluticasone [facility]) 50 MCG/ACT nasal spray Place 1 spray into both nostrils daily as needed for allergies or rhinitis.   Yes [provider]  gabapentin (NEURONTIN) 100 MG capsule Take 100-200 mg by mouth See admin instructions. 100 mg in the morning, 200 mg at  bedtime 01/01/17  Yes [provider]  HYPOCHLOROUS ACID EX Apply 0.2 % topically in the morning and at bedtime. Spray eyelids   Yes [provider]  ketotifen (ZADITOR) 0.025 % ophthalmic solution Place 1 drop into both eyes 2 (two) times daily.   Yes [provider]  leflunomide (ARAVA) 10 MG tablet Take 10 mg by mouth daily.   Yes [provider]  Lifitegrast 5 % SOLN Place 1 drop into both eyes in the morning and at bedtime.   Yes [provider]  loratadine (CLARITIN) 10 MG tablet Take 10 mg by mouth daily.   Yes [provider]  meclizine (ANTIVERT) 12.5 MG tablet Take 12.5-25 mg by mouth See admin instructions. 12.5 mg in the morning, 25 mg at bedtime   Yes [provider]  melatonin 3 MG TABS tablet Take  6 mg by mouth at bedtime.   Yes [provider]  metFORMIN (GLUCOPHAGE) 500 MG tablet Take 500 mg by mouth daily with breakfast.   Yes [provider]  Multiple Vitamins-Minerals (CENTRUM SILVER 50+WOMEN) TABS Take 1 tablet by mouth daily.   Yes [provider]  naproxen (NAPROSYN) 500 MG tablet Take 500 mg by mouth 2 (two) times daily with a meal.   Yes [provider]  omeprazole (PRILOSEC) 20 MG capsule Take 20 mg by mouth daily.   Yes [provider]  promethazine (PHENERGAN) 25 MG tablet Take 25 mg by mouth 2 (two) times daily as needed for nausea or vomiting.   Yes [provider]  rosuvastatin (CRESTOR) 20 MG tablet Take 20 mg by mouth every evening.   Yes [provider]  sertraline (ZOLOFT) 100 MG tablet Take 1 tablet (100 mg total) by mouth at bedtime. Patient taking differently: Take 200 mg by mouth at bedtime. 10/23/17  Yes Eksir, Bo Mcclintock, MD  SUMAtriptan (IMITREX) 100 MG tablet Take 100 mg by mouth every 2 (two) hours as needed for migraine. May repeat in 2 hours if headache persists or recurs.   Yes [provider]  traZODone (DESYREL) 100 MG tablet Take 1 tablet (100 mg total) by mouth at bedtime. 06/29/17  Yes Eksir, Bo Mcclintock, MD    ALLERGY: Allergies  Allergen Reactions   Penicillins Rash    Has patient had a PCN reaction causing immediate rash, facial/tongue/throat swelling, SOB or lightheadedness with hypotension: No Has patient had a PCN reaction causing severe rash involving mucus membranes or skin necrosis: No Has patient had a PCN reaction that required hospitalization: No Has patient had a PCN reaction occurring within the last 10 years: No If all of the above answers are "NO", then may proceed with Cephalosporin use.      Social History   Tobacco Use   Smoking status: Former Smoker    Types: Cigarettes    Quit date: 1978    Years since quitting: 44.2   Smokeless tobacco: Never  Used   Tobacco comment: in teen years  Substance Use Topics   Alcohol use: No     Family History  Problem Relation Age of Onset   Depression Maternal Aunt    Gallstones Mother    Colon cancer Maternal Uncle    Lupus Cousin    Cancer Cousin      ROS   ROS  Exam   There were no vitals filed for this visit. General appearance: WDWN, NAD Eyes: No scleral injection Cardiovascular: Regular rate and rhythm without murmurs, rubs, gallops. No edema  or variciosities. Distal pulses normal. Pulmonary: Effort normal, non-labored breathing Musculoskeletal:     Muscle tone upper extremities: Normal    Muscle tone lower extremities: Normal    Motor exam: Upper Extremities Deltoid Bicep Tricep Grip  Right 5/5 5/5 5/5 5/5  Left 5/5 5/5 5/5 5/5   Lower Extremity IP Quad PF DF EHL  Right 5/5 5/5 5/5 5/5 5/5  Left 5/5 5/5 5/5 5/5 5/5   Neurological Mental Status:    - Patient is awake, alert, oriented to person, place, month, year, and situation    - Patient is able to give a clear and coherent history.    - No signs of aphasia or neglect Cranial Nerves    - II: Visual Fields are full. PERRL    - III/IV/VI: EOMI without ptosis or diploplia.     - V: Facial sensation is grossly normal    - VII: Facial movement is symmetric.     - VIII: hearing is intact to voice    - X: Uvula elevates symmetrically    - XI: Shoulder shrug is symmetric.    - XII: tongue is midline without atrophy or fasciculations.  Sensory: Sensation grossly intact to LT  Results - Imaging/Labs   No results found for this or any previous visit (from the past 48 hour(s)).  No results found.  IMAGING: MRI of the lumbar spine dated 01/28/2020 was personally reviewed.  This demonstrates maintenance of normal lumbar lordosis.  There does appear to be an element of congenitally short pedicles with resultant stenosis, worst at approximately L3 through L5.  There is superimposed broad-based disc bulge, severe  facet arthropathy, and ligamentous hypertrophy which contributes to moderate to severe stenosis at L4-5.  There is mild to moderate stenosis at L3-4.  Impression/Plan   65 y.o. female with back and leg pain consistent with neurogenic claudication related to multifactorial stenosis worst at L4-5.  She has had mild improvement with conservative treatment but continues to have significant symptoms limiting normal ADLs  We will proceed with lumbar decompression and fusion including placement of interbody cages, interbody arthrodesis, posterior nonsegmental instrumentation from L4-L5 with cortical pedicle screws, and use of bone autograft, allograft, and BMP  We have reviewed the indications for surgery, the associated risks, benefits and alternatives at length in the office.  All questions today were answered and consent was obtained.  Lisbeth Renshaw, MD [facility]

## 2020-08-10 ENCOUNTER — Inpatient Hospital Stay (HOSPITAL_COMMUNITY): Payer: Medicare Other

## 2020-08-10 ENCOUNTER — Inpatient Hospital Stay (HOSPITAL_COMMUNITY): Payer: Medicare Other | Admitting: Vascular Surgery

## 2020-08-10 ENCOUNTER — Other Ambulatory Visit: Payer: Self-pay

## 2020-08-10 ENCOUNTER — Encounter (HOSPITAL_COMMUNITY): Payer: Self-pay | Admitting: Neurosurgery

## 2020-08-10 ENCOUNTER — Inpatient Hospital Stay (HOSPITAL_COMMUNITY): Payer: Medicare Other | Admitting: Anesthesiology

## 2020-08-10 ENCOUNTER — Observation Stay (HOSPITAL_COMMUNITY)
Admission: RE | Admit: 2020-08-10 | Discharge: 2020-08-11 | Disposition: A | Discharge status: Home / Self Care | Payer: Medicare Other | Source: Ambulatory Visit | Attending: Neurosurgery | Admitting: Neurosurgery

## 2020-08-10 ENCOUNTER — Ambulatory Visit (HOSPITAL_COMMUNITY)
Admission: RE | Disposition: A | Discharge status: Home / Self Care | Payer: Self-pay | Source: Ambulatory Visit | Attending: Neurosurgery

## 2020-08-10 DIAGNOSIS — Z7984 Long term (current) use of oral hypoglycemic drugs: Secondary | ICD-10-CM | POA: Diagnosis not present

## 2020-08-10 DIAGNOSIS — M47816 Spondylosis without myelopathy or radiculopathy, lumbar region: Secondary | ICD-10-CM | POA: Insufficient documentation

## 2020-08-10 DIAGNOSIS — I10 Essential (primary) hypertension: Secondary | ICD-10-CM | POA: Insufficient documentation

## 2020-08-10 DIAGNOSIS — Z981 Arthrodesis status: Secondary | ICD-10-CM | POA: Diagnosis not present

## 2020-08-10 DIAGNOSIS — M48061 Spinal stenosis, lumbar region without neurogenic claudication: Secondary | ICD-10-CM | POA: Diagnosis present

## 2020-08-10 DIAGNOSIS — M5416 Radiculopathy, lumbar region: Secondary | ICD-10-CM | POA: Diagnosis not present

## 2020-08-10 DIAGNOSIS — Z87891 Personal history of nicotine dependence: Secondary | ICD-10-CM | POA: Diagnosis not present

## 2020-08-10 DIAGNOSIS — E119 Type 2 diabetes mellitus without complications: Secondary | ICD-10-CM | POA: Insufficient documentation

## 2020-08-10 DIAGNOSIS — Z79899 Other long term (current) drug therapy: Secondary | ICD-10-CM | POA: Insufficient documentation

## 2020-08-10 DIAGNOSIS — M4726 Other spondylosis with radiculopathy, lumbar region: Secondary | ICD-10-CM | POA: Diagnosis not present

## 2020-08-10 DIAGNOSIS — M48062 Spinal stenosis, lumbar region with neurogenic claudication: Principal | ICD-10-CM | POA: Insufficient documentation

## 2020-08-10 DIAGNOSIS — Z419 Encounter for procedure for purposes other than remedying health state, unspecified: Secondary | ICD-10-CM

## 2020-08-10 DIAGNOSIS — Z880 Allergy status to penicillin: Secondary | ICD-10-CM | POA: Diagnosis not present

## 2020-08-10 DIAGNOSIS — M4326 Fusion of spine, lumbar region: Secondary | ICD-10-CM | POA: Diagnosis not present

## 2020-08-10 DIAGNOSIS — M5136 Other intervertebral disc degeneration, lumbar region: Secondary | ICD-10-CM | POA: Diagnosis not present

## 2020-08-10 DIAGNOSIS — M5116 Intervertebral disc disorders with radiculopathy, lumbar region: Secondary | ICD-10-CM | POA: Diagnosis not present

## 2020-08-10 LAB — GLUCOSE, CAPILLARY
Glucose-Capillary: 121 mg/dL — ABNORMAL HIGH (ref 70–99)
Glucose-Capillary: 125 mg/dL — ABNORMAL HIGH (ref 70–99)
Glucose-Capillary: 193 mg/dL — ABNORMAL HIGH (ref 70–99)
Glucose-Capillary: 157 mg/dL — ABNORMAL HIGH (ref 70–99)

## 2020-08-10 LAB — ABO/RH: ABO/RH(D): B POS

## 2020-08-10 SURGERY — POSTERIOR LUMBAR FUSION 1 LEVEL
Anesthesia: General

## 2020-08-10 MED ORDER — BISACODYL 10 MG RE SUPP: 10.0000 mg | Freq: Every day | RECTAL | Status: DC | PRN

## 2020-08-10 MED ORDER — LIFITEGRAST 5 % OP SOLN: 1.0000 [drp] | Freq: Two times a day (BID) | OPHTHALMIC | Status: DC

## 2020-08-10 MED ORDER — MECLIZINE HCL 25 MG PO TABS
25.0000 mg | ORAL_TABLET | Freq: Every day | ORAL | Status: DC
  Administered 2020-08-10: 25 mg via ORAL
  Filled 2020-08-10 (×3): qty 1

## 2020-08-10 MED ORDER — NALOXONE HCL 0.4 MG/ML IJ SOLN
INTRAMUSCULAR | Status: DC | PRN
  Administered 2020-08-10 (×2): 80 ug via INTRAVENOUS

## 2020-08-10 MED ORDER — DEXAMETHASONE SODIUM PHOSPHATE 10 MG/ML IJ SOLN
INTRAMUSCULAR | Status: DC | PRN
  Administered 2020-08-10: 10 mg via INTRAVENOUS

## 2020-08-10 MED ORDER — BUPIVACAINE HCL (PF) 0.5 % IJ SOLN
INTRAMUSCULAR | Status: AC
  Filled 2020-08-10: qty 30

## 2020-08-10 MED ORDER — GABAPENTIN 100 MG PO CAPS: 100.0000 mg | ORAL_CAPSULE | ORAL | Status: DC

## 2020-08-10 MED ORDER — ALBUTEROL SULFATE HFA 108 (90 BASE) MCG/ACT IN AERS
INHALATION_SPRAY | RESPIRATORY_TRACT | Status: DC | PRN
  Administered 2020-08-10 (×2): 6 via RESPIRATORY_TRACT

## 2020-08-10 MED ORDER — ACETAMINOPHEN 500 MG PO TABS: 1000.0000 mg | ORAL_TABLET | Freq: Once | ORAL | Status: DC

## 2020-08-10 MED ORDER — LIDOCAINE-EPINEPHRINE 1 %-1:100000 IJ SOLN
INTRAMUSCULAR | Status: AC
  Filled 2020-08-10: qty 1

## 2020-08-10 MED ORDER — SUMATRIPTAN SUCCINATE 100 MG PO TABS
100.0000 mg | ORAL_TABLET | ORAL | Status: DC | PRN
  Filled 2020-08-10 (×2): qty 1

## 2020-08-10 MED ORDER — VANCOMYCIN HCL 1000 MG/200ML IV SOLN
1000.0000 mg | Freq: Once | INTRAVENOUS | Status: AC
  Administered 2020-08-10: 1000 mg via INTRAVENOUS
  Filled 2020-08-10: qty 200

## 2020-08-10 MED ORDER — FENTANYL CITRATE (PF) 250 MCG/5ML IJ SOLN
INTRAMUSCULAR | Status: AC
  Filled 2020-08-10: qty 5

## 2020-08-10 MED ORDER — GABAPENTIN 100 MG PO CAPS
100.0000 mg | ORAL_CAPSULE | Freq: Every day | ORAL | Status: DC
  Administered 2020-08-11: 100 mg via ORAL
  Filled 2020-08-10: qty 1

## 2020-08-10 MED ORDER — ACETAMINOPHEN 650 MG RE SUPP: 650.0000 mg | RECTAL | Status: DC | PRN

## 2020-08-10 MED ORDER — OXYCODONE HCL 5 MG PO TABS
5.0000 mg | ORAL_TABLET | ORAL | Status: DC | PRN
  Administered 2020-08-10 (×2): 10 mg via ORAL
  Filled 2020-08-10 (×2): qty 2

## 2020-08-10 MED ORDER — MENTHOL 3 MG MT LOZG: 1.0000 | LOZENGE | OROMUCOSAL | Status: DC | PRN

## 2020-08-10 MED ORDER — CHLORHEXIDINE GLUCONATE 0.12 % MT SOLN
OROMUCOSAL | Status: AC
  Administered 2020-08-10: 15 mL via OROMUCOSAL
  Filled 2020-08-10: qty 15

## 2020-08-10 MED ORDER — PROPOFOL 10 MG/ML IV BOLUS
INTRAVENOUS | Status: DC | PRN
  Administered 2020-08-10: 150 mg via INTRAVENOUS

## 2020-08-10 MED ORDER — PHENYLEPHRINE 40 MCG/ML (10ML) SYRINGE FOR IV PUSH (FOR BLOOD PRESSURE SUPPORT)
PREFILLED_SYRINGE | INTRAVENOUS | Status: AC
  Filled 2020-08-10: qty 10

## 2020-08-10 MED ORDER — ALBUTEROL SULFATE HFA 108 (90 BASE) MCG/ACT IN AERS
INHALATION_SPRAY | RESPIRATORY_TRACT | Status: AC
  Filled 2020-08-10: qty 6.7

## 2020-08-10 MED ORDER — MIDAZOLAM HCL 2 MG/2ML IJ SOLN
INTRAMUSCULAR | Status: DC | PRN
  Administered 2020-08-10: 2 mg via INTRAVENOUS

## 2020-08-10 MED ORDER — CHLORHEXIDINE GLUCONATE CLOTH 2 % EX PADS: 6.0000 | MEDICATED_PAD | Freq: Once | CUTANEOUS | Status: DC

## 2020-08-10 MED ORDER — SUMATRIPTAN SUCCINATE 100 MG PO TABS: 100.0000 mg | ORAL_TABLET | ORAL | Status: DC | PRN

## 2020-08-10 MED ORDER — MIDAZOLAM HCL 2 MG/2ML IJ SOLN
INTRAMUSCULAR | Status: AC
  Filled 2020-08-10: qty 2

## 2020-08-10 MED ORDER — SODIUM CHLORIDE 0.9% FLUSH
3.0000 mL | Freq: Two times a day (BID) | INTRAVENOUS | Status: DC
  Administered 2020-08-10 (×2): 3 mL via INTRAVENOUS

## 2020-08-10 MED ORDER — BUPIVACAINE HCL (PF) 0.5 % IJ SOLN
INTRAMUSCULAR | Status: DC | PRN
  Administered 2020-08-10: 5 mL

## 2020-08-10 MED ORDER — ONDANSETRON HCL 4 MG/2ML IJ SOLN
INTRAMUSCULAR | Status: AC
  Filled 2020-08-10: qty 2

## 2020-08-10 MED ORDER — ALLOPURINOL 100 MG PO TABS
200.0000 mg | ORAL_TABLET | Freq: Every day | ORAL | Status: DC
  Administered 2020-08-10 – 2020-08-11 (×2): 200 mg via ORAL
  Filled 2020-08-10 (×2): qty 2

## 2020-08-10 MED ORDER — PHENOL 1.4 % MT LIQD: 1.0000 | OROMUCOSAL | Status: DC | PRN

## 2020-08-10 MED ORDER — ONDANSETRON HCL 4 MG PO TABS: 4.0000 mg | ORAL_TABLET | Freq: Four times a day (QID) | ORAL | Status: DC | PRN

## 2020-08-10 MED ORDER — FLUMAZENIL 0.5 MG/5ML IV SOLN
INTRAVENOUS | Status: DC | PRN
  Administered 2020-08-10 (×2): .1 mg via INTRAVENOUS

## 2020-08-10 MED ORDER — SENNOSIDES-DOCUSATE SODIUM 8.6-50 MG PO TABS: 1.0000 | ORAL_TABLET | Freq: Every evening | ORAL | Status: DC | PRN

## 2020-08-10 MED ORDER — LACTATED RINGERS IV SOLN: INTRAVENOUS | Status: DC

## 2020-08-10 MED ORDER — THROMBIN 5000 UNITS EX SOLR
OROMUCOSAL | Status: DC | PRN
  Administered 2020-08-10: 5 mL via TOPICAL

## 2020-08-10 MED ORDER — SODIUM CHLORIDE 0.9% FLUSH: 3.0000 mL | INTRAVENOUS | Status: DC | PRN

## 2020-08-10 MED ORDER — ACETAMINOPHEN 500 MG PO TABS
1000.0000 mg | ORAL_TABLET | Freq: Four times a day (QID) | ORAL | Status: DC
  Administered 2020-08-10: 1000 mg via ORAL
  Filled 2020-08-10: qty 2

## 2020-08-10 MED ORDER — PROMETHAZINE HCL 25 MG PO TABS
25.0000 mg | ORAL_TABLET | Freq: Two times a day (BID) | ORAL | Status: DC | PRN
  Administered 2020-08-10: 25 mg via ORAL
  Filled 2020-08-10: qty 1

## 2020-08-10 MED ORDER — PANTOPRAZOLE SODIUM 40 MG PO TBEC
40.0000 mg | DELAYED_RELEASE_TABLET | Freq: Every day | ORAL | Status: DC
  Administered 2020-08-10 – 2020-08-11 (×2): 40 mg via ORAL
  Filled 2020-08-10 (×2): qty 1

## 2020-08-10 MED ORDER — ONDANSETRON HCL 4 MG/2ML IJ SOLN: 4.0000 mg | Freq: Four times a day (QID) | INTRAMUSCULAR | Status: DC | PRN

## 2020-08-10 MED ORDER — SUGAMMADEX SODIUM 200 MG/2ML IV SOLN
INTRAVENOUS | Status: DC | PRN
  Administered 2020-08-10 (×2): 200 mg via INTRAVENOUS

## 2020-08-10 MED ORDER — SODIUM CHLORIDE 0.9 % IV SOLN: INTRAVENOUS | Status: DC

## 2020-08-10 MED ORDER — PROPOFOL 10 MG/ML IV BOLUS
INTRAVENOUS | Status: AC
  Filled 2020-08-10: qty 20

## 2020-08-10 MED ORDER — ROCURONIUM BROMIDE 10 MG/ML (PF) SYRINGE
PREFILLED_SYRINGE | INTRAVENOUS | Status: DC | PRN
  Administered 2020-08-10: 10 mg via INTRAVENOUS
  Administered 2020-08-10: 70 mg via INTRAVENOUS
  Administered 2020-08-10: 20 mg via INTRAVENOUS

## 2020-08-10 MED ORDER — HYDROCODONE-ACETAMINOPHEN 5-325 MG PO TABS
1.0000 | ORAL_TABLET | ORAL | Status: DC | PRN
  Administered 2020-08-11: 1 via ORAL
  Filled 2020-08-10 (×2): qty 1

## 2020-08-10 MED ORDER — LABETALOL HCL 5 MG/ML IV SOLN
5.0000 mg | Freq: Once | INTRAVENOUS | Status: AC
  Administered 2020-08-10: 5 mg via INTRAVENOUS

## 2020-08-10 MED ORDER — METFORMIN HCL 500 MG PO TABS
500.0000 mg | ORAL_TABLET | Freq: Every day | ORAL | Status: DC
  Administered 2020-08-11: 500 mg via ORAL
  Filled 2020-08-10: qty 1

## 2020-08-10 MED ORDER — DEXAMETHASONE SODIUM PHOSPHATE 10 MG/ML IJ SOLN
INTRAMUSCULAR | Status: AC
  Filled 2020-08-10: qty 1

## 2020-08-10 MED ORDER — FLEET ENEMA 7-19 GM/118ML RE ENEM: 1.0000 | ENEMA | Freq: Once | RECTAL | Status: DC | PRN

## 2020-08-10 MED ORDER — SENNA 8.6 MG PO TABS
1.0000 | ORAL_TABLET | Freq: Two times a day (BID) | ORAL | Status: DC
  Administered 2020-08-10 – 2020-08-11 (×2): 8.6 mg via ORAL
  Filled 2020-08-10 (×2): qty 1

## 2020-08-10 MED ORDER — KETOTIFEN FUMARATE 0.025 % OP SOLN
1.0000 [drp] | Freq: Two times a day (BID) | OPHTHALMIC | Status: DC
  Administered 2020-08-10 – 2020-08-11 (×2): 1 [drp] via OPHTHALMIC
  Filled 2020-08-10: qty 5

## 2020-08-10 MED ORDER — LIDOCAINE-EPINEPHRINE 1 %-1:100000 IJ SOLN
INTRAMUSCULAR | Status: DC | PRN
  Administered 2020-08-10: 5 mL

## 2020-08-10 MED ORDER — FENTANYL CITRATE (PF) 100 MCG/2ML IJ SOLN
INTRAMUSCULAR | Status: AC
  Filled 2020-08-10: qty 2

## 2020-08-10 MED ORDER — THROMBIN (RECOMBINANT) 5000 UNITS EX SOLR
CUTANEOUS | Status: AC
  Filled 2020-08-10: qty 5000

## 2020-08-10 MED ORDER — PHENYLEPHRINE 40 MCG/ML (10ML) SYRINGE FOR IV PUSH (FOR BLOOD PRESSURE SUPPORT)
PREFILLED_SYRINGE | INTRAVENOUS | Status: DC | PRN
  Administered 2020-08-10 (×2): 120 ug via INTRAVENOUS

## 2020-08-10 MED ORDER — SERTRALINE HCL 50 MG PO TABS
200.0000 mg | ORAL_TABLET | Freq: Every day | ORAL | Status: DC
  Administered 2020-08-10: 200 mg via ORAL
  Filled 2020-08-10: qty 4

## 2020-08-10 MED ORDER — ONDANSETRON HCL 4 MG/2ML IJ SOLN
INTRAMUSCULAR | Status: DC | PRN
  Administered 2020-08-10: 4 mg via INTRAVENOUS

## 2020-08-10 MED ORDER — MECLIZINE HCL 12.5 MG PO TABS
12.5000 mg | ORAL_TABLET | Freq: Every day | ORAL | Status: DC
  Administered 2020-08-11: 12.5 mg via ORAL
  Filled 2020-08-10: qty 1

## 2020-08-10 MED ORDER — MECLIZINE HCL 12.5 MG PO TABS: 12.5000 mg | ORAL_TABLET | ORAL | Status: DC

## 2020-08-10 MED ORDER — VANCOMYCIN HCL IN DEXTROSE 1-5 GM/200ML-% IV SOLN
INTRAVENOUS | Status: AC
  Filled 2020-08-10: qty 200

## 2020-08-10 MED ORDER — LABETALOL HCL 5 MG/ML IV SOLN
INTRAVENOUS | Status: AC
  Filled 2020-08-10: qty 4

## 2020-08-10 MED ORDER — PHENYLEPHRINE HCL-NACL 10-0.9 MG/250ML-% IV SOLN
INTRAVENOUS | Status: DC | PRN
  Administered 2020-08-10: 50 ug/min via INTRAVENOUS

## 2020-08-10 MED ORDER — TRAZODONE HCL 100 MG PO TABS
100.0000 mg | ORAL_TABLET | Freq: Every day | ORAL | Status: DC
  Administered 2020-08-10: 100 mg via ORAL
  Filled 2020-08-10 (×2): qty 1

## 2020-08-10 MED ORDER — ORAL CARE MOUTH RINSE: 15.0000 mL | Freq: Once | OROMUCOSAL | Status: AC

## 2020-08-10 MED ORDER — LEFLUNOMIDE 20 MG PO TABS
10.0000 mg | ORAL_TABLET | Freq: Every day | ORAL | Status: DC
  Administered 2020-08-10: 10 mg via ORAL
  Filled 2020-08-10 (×2): qty 0.5

## 2020-08-10 MED ORDER — GABAPENTIN 100 MG PO CAPS
200.0000 mg | ORAL_CAPSULE | Freq: Every day | ORAL | Status: DC
  Administered 2020-08-10: 200 mg via ORAL
  Filled 2020-08-10: qty 2

## 2020-08-10 MED ORDER — DOCUSATE SODIUM 100 MG PO CAPS
100.0000 mg | ORAL_CAPSULE | Freq: Two times a day (BID) | ORAL | Status: DC
  Administered 2020-08-11: 100 mg via ORAL

## 2020-08-10 MED ORDER — MELATONIN 3 MG PO TABS
6.0000 mg | ORAL_TABLET | Freq: Every day | ORAL | Status: DC
  Filled 2020-08-10: qty 2

## 2020-08-10 MED ORDER — METHOCARBAMOL 500 MG PO TABS
500.0000 mg | ORAL_TABLET | Freq: Four times a day (QID) | ORAL | Status: DC | PRN
  Administered 2020-08-10 – 2020-08-11 (×3): 500 mg via ORAL
  Filled 2020-08-10 (×3): qty 1

## 2020-08-10 MED ORDER — ONDANSETRON HCL 4 MG/2ML IJ SOLN: 4.0000 mg | Freq: Once | INTRAMUSCULAR | Status: DC | PRN

## 2020-08-10 MED ORDER — 0.9 % SODIUM CHLORIDE (POUR BTL) OPTIME
TOPICAL | Status: DC | PRN
  Administered 2020-08-10: 1000 mL

## 2020-08-10 MED ORDER — CLONAZEPAM 0.5 MG PO TABS
1.0000 mg | ORAL_TABLET | ORAL | Status: DC
  Administered 2020-08-11: 1 mg via ORAL
  Filled 2020-08-10: qty 2

## 2020-08-10 MED ORDER — CEFAZOLIN SODIUM 1 G IJ SOLR
INTRAMUSCULAR | Status: AC
  Filled 2020-08-10: qty 20

## 2020-08-10 MED ORDER — FENTANYL CITRATE (PF) 100 MCG/2ML IJ SOLN
25.0000 ug | INTRAMUSCULAR | Status: DC | PRN
  Administered 2020-08-10 (×2): 25 ug via INTRAVENOUS

## 2020-08-10 MED ORDER — HYDROMORPHONE HCL 1 MG/ML IJ SOLN
0.5000 mg | INTRAMUSCULAR | Status: DC | PRN
  Administered 2020-08-11: 1 mg via INTRAVENOUS
  Filled 2020-08-10: qty 1

## 2020-08-10 MED ORDER — ACETAMINOPHEN 325 MG PO TABS
650.0000 mg | ORAL_TABLET | ORAL | Status: DC | PRN
Start: 2020-08-10 — End: 2020-08-11
  Administered 2020-08-11: 650 mg via ORAL
  Filled 2020-08-10: qty 2

## 2020-08-10 MED ORDER — CHLORHEXIDINE GLUCONATE 0.12 % MT SOLN: 15.0000 mL | Freq: Once | OROMUCOSAL | Status: AC

## 2020-08-10 MED ORDER — ROSUVASTATIN CALCIUM 20 MG PO TABS
20.0000 mg | ORAL_TABLET | Freq: Every evening | ORAL | Status: DC
  Administered 2020-08-10: 20 mg via ORAL
  Filled 2020-08-10: qty 1

## 2020-08-10 MED ORDER — SODIUM CHLORIDE 0.9 % IV SOLN: 250.0000 mL | INTRAVENOUS | Status: DC

## 2020-08-10 MED ORDER — METHOCARBAMOL 1000 MG/10ML IJ SOLN
500.0000 mg | Freq: Four times a day (QID) | INTRAVENOUS | Status: DC | PRN
  Filled 2020-08-10: qty 5

## 2020-08-10 MED ORDER — FENTANYL CITRATE (PF) 250 MCG/5ML IJ SOLN
INTRAMUSCULAR | Status: DC | PRN
  Administered 2020-08-10 (×3): 50 ug via INTRAVENOUS
  Administered 2020-08-10: 150 ug via INTRAVENOUS
  Administered 2020-08-10: 50 ug via INTRAVENOUS

## 2020-08-10 MED ORDER — LIDOCAINE 2% (20 MG/ML) 5 ML SYRINGE
INTRAMUSCULAR | Status: DC | PRN
  Administered 2020-08-10: 100 mg via INTRAVENOUS

## 2020-08-10 MED ORDER — LIDOCAINE 2% (20 MG/ML) 5 ML SYRINGE
INTRAMUSCULAR | Status: AC
  Filled 2020-08-10: qty 10

## 2020-08-10 MED ORDER — ACETAMINOPHEN 500 MG PO TABS
ORAL_TABLET | ORAL | Status: AC
  Administered 2020-08-10: 500 mg
  Filled 2020-08-10: qty 2

## 2020-08-10 SURGICAL SUPPLY — 68 items
BASKET BONE COLLECTION (BASKET) ×3 IMPLANT
BENZOIN TINCTURE PRP APPL 2/3 (GAUZE/BANDAGES/DRESSINGS) IMPLANT
BLADE CLIPPER SURG (BLADE) IMPLANT
BLADE SURG 11 STRL SS (BLADE) ×3 IMPLANT
BUR MATCHSTICK NEURO 3.0 LAGG (BURR) ×3 IMPLANT
BUR PRECISION FLUTE 5.0 (BURR) ×6 IMPLANT
CAGE INTERBODY PL SHT 7X22.5 (Plate) ×6 IMPLANT
CANISTER SUCT 3000ML PPV (MISCELLANEOUS) ×3 IMPLANT
CARTRIDGE OIL MAESTRO DRILL (MISCELLANEOUS) ×1 IMPLANT
CLOSURE WOUND 1/2 X4 (GAUZE/BANDAGES/DRESSINGS)
CNTNR URN SCR LID CUP LEK RST (MISCELLANEOUS) ×1 IMPLANT
CONT SPEC 4OZ STRL OR WHT (MISCELLANEOUS) ×2
COVER BACK TABLE 60X90IN (DRAPES) ×3 IMPLANT
COVER WAND RF STERILE (DRAPES) IMPLANT
DECANTER SPIKE VIAL GLASS SM (MISCELLANEOUS) ×3 IMPLANT
DERMABOND ADVANCED (GAUZE/BANDAGES/DRESSINGS) ×2
DERMABOND ADVANCED .7 DNX12 (GAUZE/BANDAGES/DRESSINGS) ×1 IMPLANT
DIFFUSER DRILL AIR PNEUMATIC (MISCELLANEOUS) ×3 IMPLANT
DRAPE C-ARM 42X72 X-RAY (DRAPES) ×3 IMPLANT
DRAPE C-ARMOR (DRAPES) ×3 IMPLANT
DRAPE LAPAROTOMY 100X72X124 (DRAPES) ×3 IMPLANT
DRAPE SURG 17X23 STRL (DRAPES) ×3 IMPLANT
DRSG OPSITE POSTOP 4X6 (GAUZE/BANDAGES/DRESSINGS) ×3 IMPLANT
DURAPREP 26ML APPLICATOR (WOUND CARE) ×3 IMPLANT
ELECT REM PT RETURN 9FT ADLT (ELECTROSURGICAL) ×3
ELECTRODE REM PT RTRN 9FT ADLT (ELECTROSURGICAL) ×1 IMPLANT
GAUZE 4X4 16PLY RFD (DISPOSABLE) IMPLANT
GAUZE SPONGE 4X4 12PLY STRL (GAUZE/BANDAGES/DRESSINGS) IMPLANT
GLOVE BIO SURGEON STRL SZ7.5 (GLOVE) IMPLANT
GLOVE BIOGEL PI IND STRL 7.5 (GLOVE) ×2 IMPLANT
GLOVE BIOGEL PI INDICATOR 7.5 (GLOVE) ×4
GLOVE ECLIPSE 7.0 STRL STRAW (GLOVE) ×6 IMPLANT
GLOVE EXAM NITRILE XL STR (GLOVE) IMPLANT
GOWN STRL REUS W/ TWL LRG LVL3 (GOWN DISPOSABLE) ×4 IMPLANT
GOWN STRL REUS W/ TWL XL LVL3 (GOWN DISPOSABLE) IMPLANT
GOWN STRL REUS W/TWL 2XL LVL3 (GOWN DISPOSABLE) IMPLANT
GOWN STRL REUS W/TWL LRG LVL3 (GOWN DISPOSABLE) ×8
GOWN STRL REUS W/TWL XL LVL3 (GOWN DISPOSABLE)
GRAFT BONE PROTEIOS XS 0.5CC (Orthopedic Implant) ×3 IMPLANT
HEMOSTAT POWDER KIT SURGIFOAM (HEMOSTASIS) ×3 IMPLANT
KIT BASIN OR (CUSTOM PROCEDURE TRAY) ×3 IMPLANT
KIT POSITION SURG JACKSON T1 (MISCELLANEOUS) ×3 IMPLANT
KIT TURNOVER KIT B (KITS) ×3 IMPLANT
MILL MEDIUM DISP (BLADE) ×3 IMPLANT
NEEDLE HYPO 18GX1.5 BLUNT FILL (NEEDLE) IMPLANT
NEEDLE HYPO 22GX1.5 SAFETY (NEEDLE) ×3 IMPLANT
NEEDLE SPNL 18GX3.5 QUINCKE PK (NEEDLE) IMPLANT
NS IRRIG 1000ML POUR BTL (IV SOLUTION) ×3 IMPLANT
OIL CARTRIDGE MAESTRO DRILL (MISCELLANEOUS) ×3
PACK LAMINECTOMY NEURO (CUSTOM PROCEDURE TRAY) ×3 IMPLANT
PAD ARMBOARD 7.5X6 YLW CONV (MISCELLANEOUS) ×9 IMPLANT
PUTTY DBF GRAFTON 3CC W/DELIVE (Putty) ×3 IMPLANT
ROD COBALT 47.5X35 (Rod) ×6 IMPLANT
SCREW 5.5X35MM (Screw) ×8 IMPLANT
SCREW BN 35X5.5XMA NS SPNE (Screw) ×4 IMPLANT
SCREW SET SOLERA (Screw) ×12 IMPLANT
SCREW SET SOLERA TI (Screw) ×6 IMPLANT
SPONGE LAP 4X18 RFD (DISPOSABLE) IMPLANT
SPONGE SURGIFOAM ABS GEL 100 (HEMOSTASIS) IMPLANT
STRIP CLOSURE SKIN 1/2X4 (GAUZE/BANDAGES/DRESSINGS) IMPLANT
SUT VIC AB 0 CT1 18XCR BRD8 (SUTURE) ×2 IMPLANT
SUT VIC AB 0 CT1 8-18 (SUTURE) ×4
SUT VICRYL 3-0 RB1 18 ABS (SUTURE) ×6 IMPLANT
SYR 3ML LL SCALE MARK (SYRINGE) ×9 IMPLANT
TOWEL GREEN STERILE (TOWEL DISPOSABLE) ×3 IMPLANT
TOWEL GREEN STERILE FF (TOWEL DISPOSABLE) ×3 IMPLANT
TRAY FOLEY MTR SLVR 16FR STAT (SET/KITS/TRAYS/PACK) ×3 IMPLANT
WATER STERILE IRR 1000ML POUR (IV SOLUTION) ×3 IMPLANT

## 2020-08-10 NOTE — Evaluation (Signed)
Physical Therapy Evaluation Patient Details Name: Rebecca Hahn MRN: [id number] DOB: 1955/12/30 Today's Date: 08/10/2020   History of Present Illness  Pt is a 65 y.o. female who presents 3/22 with chronic low back and bil leg pain. MRI of lumbar spine revealing moderate to severe multifactorial stenosis at L4-5. Failed conservative management. S/p L4 laminectomy with facetectomy and PLIF L4-5 3/22. PMH: vertigo, sjoegren syndrome, osteoarthritis, obesity, lumbar radiculopathy, HTN, hearing loss, gout, glaucoma, GERD, DM2, cognitive dysfnction, anemia, alopecia, and agenesis of gallbladder.   Clinical Impression   Pt presents with condition above and deficits mentioned below, see PT Problem List. PTA, she was living alone in a ground-floor apartment with a very helpful upstairs neighbor that checks in on her often. Pt mod I with mobility using a rollator PTA. Pt reports a hx of posterior falls that began in December. Currently, pt needing min guard-A for standing mobility with bil UE support secondary to lower extremity weakness and decreased sensation, incoordination, imbalance, and decreased activity tolerance. Pt is at risk for falls. Pt educated that if she were to return home she should have 24/7 assistance initially for safety with mobility. Recommending HH PT upon d/c to address her deficits and maximize her independence and safety with all functional mobility. Will continue to follow acutely.    Follow Up Recommendations Home health PT;Supervision for mobility/OOB (plan is for daughter to stay with her initially)    Equipment Recommendations  None recommended by PT    Recommendations for Other Services       Precautions / Restrictions Precautions Precautions: Fall;Back Precaution Booklet Issued: Yes (comment) Precaution Comments: reviewed precautions Required Braces or Orthoses: Spinal Brace Spinal Brace: Lumbar corset;Applied in sitting position (did not arrive until after  session, pt received ambulating with NT) Restrictions Weight Bearing Restrictions: No      Mobility  Bed Mobility Overal bed mobility: Needs Assistance Bed Mobility: Rolling;Sidelying to Sit;Sit to Sidelying Rolling: Min guard Sidelying to sit: Min guard     Sit to sidelying: Mod assist;Min guard General bed mobility comments: Pt needing reminders to log roll without twisting at trunk. Pt needing rails to roll in bed. ModA with first attempt to raise legs into bed but min guard with extra time and effort for pt to do so 2nd attempt.    Transfers                 General transfer comment: Pt received ambulating in hall with NT.  Ambulation/Gait Ambulation/Gait assistance: Min guard;Min assist Gait Distance (Feet): 125 Feet Assistive device: Rolling walker (2 wheeled) Gait Pattern/deviations: Step-through pattern;Decreased step length - right;Decreased step length - left;Decreased stride length Gait velocity: reduced Gait velocity interpretation: <1.31 ft/sec, indicative of household ambulator General Gait Details: Pt with slow, mildly unsteady gait and decreased stride length. Min guard initially for safety but as pt fatigued she would stand and rest by flexing at her knees. Cued pt to not do so as to prevent falls but rather lean up against the wall, success, but pt too fatigued to walk back to room. MinA to steady pt urgently into chair to prevent falls. Educated pt to inform PT when starting to get fatigued next session to ensure safety.  Stairs            Wheelchair Mobility    Modified Rankin (Stroke Patients Only)       Balance Overall balance assessment: Needs assistance Sitting-balance support: No upper extremity supported;Feet supported Sitting balance-Leahy Scale: Good  Standing balance support: Bilateral upper extremity supported;During functional activity Standing balance-Leahy Scale: Poor Standing balance comment: Reliant on bil UE support.                              Pertinent Vitals/Pain Pain Assessment: 0-10 Pain Score: 10-Worst pain ever Pain Location: back; surgical site Pain Descriptors / Indicators: Discomfort;Grimacing;Guarding;Operative site guarding Pain Intervention(s): Limited activity within patient's tolerance;Monitored during session;Repositioned    Home Living Family/patient expects to be discharged to:: Private residence Living Arrangements: Alone Available Help at Discharge: Family;Friend(s);Neighbor;Available 24 hours/day (24/7 initially from daughter; later intermittent assistance available) Type of Home: Apartment (ground floor) Home Access: Level entry     Home Layout: One level Home Equipment: Walker - 4 wheels;Bedside commode;Tub bench;Wheelchair - manual (rail on bed installed) Additional Comments: Reports hx of falling posteriorly since December.    Prior Function Level of Independence: Independent with assistive device(s)         Comments: Mod I with use of rollator. Pt drives.     Hand Dominance        Extremity/Trunk Assessment   Upper Extremity Assessment Upper Extremity Assessment: Defer to OT evaluation    Lower Extremity Assessment Lower Extremity Assessment: RLE deficits/detail;LLE deficits/detail RLE Deficits / Details: MMT scores of 4 to 4+ grossly RLE Sensation: decreased light touch (at thigh only) RLE Coordination: decreased gross motor LLE Deficits / Details: MMT scores of 4- to 4+ grossly LLE Sensation: decreased light touch (throughout) LLE Coordination: decreased gross motor;decreased fine motor    Cervical / Trunk Assessment Cervical / Trunk Assessment: Other exceptions Cervical / Trunk Exceptions: spinal surgery  Communication   Communication: No difficulties  Cognition Arousal/Alertness: Awake/alert Behavior During Therapy: WFL for tasks assessed/performed Overall Cognitive Status: Within Functional Limits for tasks assessed                                  General Comments: Likely her baseline. Pt tends to close eyes ith mobility, likely due to pain and fatigue (pt aware and admits that she does this). Pt not expressing that she should turn around to return to room due to fatigue but rather continued to ambulate until she suddenly became weak and needed to urgently get a chair to sit and rest to prevent fall, success. So possible poor deficits and safety awareness.      General Comments      Exercises     Assessment/Plan    PT Assessment Patient needs continued PT services  PT Problem List Decreased strength;Decreased range of motion;Decreased activity tolerance;Decreased balance;Decreased mobility;Decreased coordination;Decreased knowledge of use of DME;Decreased safety awareness;Decreased knowledge of precautions;Impaired sensation;Obesity;Pain       PT Treatment Interventions DME instruction;Gait training;Functional mobility training;Therapeutic activities;Therapeutic exercise;Balance training;Neuromuscular re-education;Patient/family education    PT Goals (Current goals can be found in the Care Plan section)  Acute Rehab PT Goals Patient Stated Goal: to go home PT Goal Formulation: With patient Time For Goal Achievement: 08/24/20 Potential to Achieve Goals: Good    Frequency Min 5X/week   Barriers to discharge        Co-evaluation               AM-PAC PT "6 Clicks" Mobility  Outcome Measure Help needed turning from your back to your side while in a flat bed without using bedrails?: A Lot Help needed moving from lying on  your back to sitting on the side of a flat bed without using bedrails?: A Little Help needed moving to and from a bed to a chair (including a wheelchair)?: A Little Help needed standing up from a chair using your arms (e.g., wheelchair or bedside chair)?: A Little Help needed to walk in hospital room?: A Little Help needed climbing 3-5 steps with a railing? : A Lot 6  Click Score: 16    End of Session Equipment Utilized During Treatment: Gait belt Activity Tolerance: Patient limited by fatigue;Patient limited by pain Patient left: in bed;with call bell/phone within reach;with bed alarm set Nurse Communication: Mobility status PT Visit Diagnosis: Unsteadiness on feet (R26.81);Other abnormalities of gait and mobility (R26.89);Muscle weakness (generalized) (M62.81);History of falling (Z91.81);Difficulty in walking, not elsewhere classified (R26.2)    Time: 1610-9604 PT Time Calculation (min) (ACUTE ONLY): 24 min   Charges:   PT Evaluation $PT Eval Moderate Complexity: 1 Mod PT Treatments $Therapeutic Activity: 8-22 mins        Raymond Gurney, PT, DPT Acute Rehabilitation Services  Pager: [phone] Office: [phone]   Jewel Baize 08/10/2020, 6:22 PM

## 2020-08-10 NOTE — Anesthesia Postprocedure Evaluation (Signed)
Anesthesia Post Note  Patient: Rebecca Hahn  Procedure(s) Performed: Posteiror Lumbar Interbody Fusion Lumbar Four-Five (N/A )     Patient location during evaluation: PACU Anesthesia Type: General Level of consciousness: awake and alert Pain management: pain level controlled Vital Signs Assessment: post-procedure vital signs reviewed and stable Respiratory status: spontaneous breathing, nonlabored ventilation, respiratory function stable and patient connected to nasal cannula oxygen (BiPAP) Cardiovascular status: blood pressure returned to baseline and stable Postop Assessment: no apparent nausea or vomiting Anesthetic complications: no   No complications documented.  Last Vitals:  Vitals:   08/10/20 1718 08/10/20 1937  BP: (!) 152/72 134/80  Pulse: (!) 103 (!) 104  Resp: 19 20  Temp: 36.9 C 36.6 C  SpO2: 97% 95%    Last Pain:  Vitals:   08/10/20 2147  TempSrc:   PainSc: 10-Worst pain ever                 Cecile Hearing

## 2020-08-10 NOTE — Anesthesia Procedure Notes (Signed)
Procedure Name: Intubation Date/Time: 08/10/2020 8:02 AM Performed by: Janace Litten, CRNA Pre-anesthesia Checklist: Patient identified, Emergency Drugs available, Suction available and Patient being monitored Patient Re-evaluated:Patient Re-evaluated prior to induction Oxygen Delivery Method: Circle System Utilized Preoxygenation: Pre-oxygenation with 100% oxygen Induction Type: IV induction Ventilation: Mask ventilation without difficulty Laryngoscope Size: Mac and 3 Grade View: Grade II Tube type: Oral Tube size: 7.0 mm Number of attempts: 1 Airway Equipment and Method: Stylet Placement Confirmation: ETT inserted through vocal cords under direct vision,  positive ETCO2 and breath sounds checked- equal and bilateral Secured at: 21 cm Tube secured with: Tape Dental Injury: Teeth and Oropharynx as per pre-operative assessment

## 2020-08-10 NOTE — Transfer of Care (Signed)
Immediate Anesthesia Transfer of Care Note  Patient: Rebecca Hahn  Procedure(s) Performed: Posteiror Lumbar Interbody Fusion Lumbar Four-Five (N/A )  Patient Location: PACU  Anesthesia Type:General  Level of Consciousness: drowsy, patient cooperative and responds to stimulation  Airway & Oxygen Therapy: Patient Spontanous Breathing and Patient connected to face mask oxygen  Post-op Assessment: Report given to RN and Post -op Vital signs reviewed and stable  Post vital signs: Reviewed and stable  Last Vitals:  Vitals Value Taken Time  BP 139/76 08/10/20 1157  Temp    Pulse 118 08/10/20 1159  Resp 23 08/10/20 1159  SpO2 96 % 08/10/20 1159  Vitals shown include unvalidated device data.  Last Pain:  Vitals:   08/10/20 0702  TempSrc: Oral  PainSc: 0-No pain         Complications: No complications documented.

## 2020-08-10 NOTE — Progress Notes (Signed)
Pharmacy Antibiotic Note  BLIMA JAIMES is a 65 y.o. female admitted on 08/10/2020 with surgical prophylaxis.  Pharmacy has been consulted for one vancomycin dose 12 hours post-op. Patient received vancomycin 1500 mg x 1 @0705 . AET 1202.   Plan: Vancomycin 1000mg  IV x1. Per Op note, no drain placed. Pharmacy sign off.  Height: 5\' 6"  (167.6 cm) Weight: 120.6 kg (265 lb 14 oz) IBW/kg (Calculated) : 59.3  Temp (24hrs), Avg:97.4 F (36.3 C), Min:97 F (36.1 C), Max:98 F (36.7 C)  Recent Labs  Lab 08/06/20 1000  WBC 8.2  CREATININE 0.77    Estimated Creatinine Clearance: 92.7 mL/min (by C-G formula based on SCr of 0.77 mg/dL).    Allergies  Allergen Reactions  . Penicillins Rash    Has patient had a PCN reaction causing immediate rash, facial/tongue/throat swelling, SOB or lightheadedness with hypotension: No Has patient had a PCN reaction causing severe rash involving mucus membranes or skin necrosis: No Has patient had a PCN reaction that required hospitalization: No Has patient had a PCN reaction occurring within the last 10 years: No If all of the above answers are "NO", then may proceed with Cephalosporin use.         Thank you for allowing to participate in this patients care. , PharmD 08/10/2020 3:19 PM  Please check [email or web address] for unit-specific pharmacy phone numbers.

## 2020-08-10 NOTE — Op Note (Signed)
NEUROSURGERY OPERATIVE NOTE   PREOP DIAGNOSIS:  1. Lumbar stenosis with neurogenic claudication 2. Lumbar spondylosis, L4-5  POSTOP DIAGNOSIS: Same  PROCEDURE: 1. L4 laminectomy with facetectomy for decompression of exiting nerve roots, more than would be required for placement of interbody graft 2. Placement of anterior interbody device - [employer] expandable cage, short 42mm lordotic x2 3. Posterior non-segmental instrumentation using cortical pedicle screws at L4 - L5 4. Interbody arthrodesis, L4-5 5. Use of locally harvested bone autograft 6. Use of non-structural bone allograft - Proteos  SURGEON: Dr. Lisbeth Renshaw, MD  ASSISTANT: Cindra Presume, PA-C  ANESTHESIA: General Endotracheal  EBL: 200cc  SPECIMENS: None  DRAINS: None  COMPLICATIONS: None immediate  CONDITION: Hemodynamically stable to PACU  HISTORY: Rebecca Hahn is a 65 y.o. female who has been followed in the outpatient clinic with back and leg pain related to spondylosis and severe multifactorial stenosis at L4-5. Multiple conservative treatments were attempted without significant improvement and she ultimately elected to proceed with surgical decompression and fusion. Risks, benefits, and alternative treatments were reviewed in detail in the office. After all questions were answered, informed consent was obtained and witnessed.  PROCEDURE IN DETAIL: The patient was brought to the operating room via stretcher. After induction of general anesthesia, the patient was positioned on the operative table in the prone position. All pressure points were meticulously padded. Incision was then marked out and prepped and draped in the usual sterile fashion.  After timeout was conducted, skin was infiltrated with local anesthetic.  Spinal needle was introduced and lateral fluoroscopy was used to identify the surface projection of the L4-5 interspace.  Skin incision was then made sharply and Bovie electrocautery was  used to dissect the subcutaneous tissue until the lumbodorsal fascia was identified and incised. The muscle was then elevated in the subperiosteal plane and the L4 lamina and L4-5 facet complexes were identified. Self-retaining retractors were then placed. Lateral fluoroscopy was taken with a dissector in the L4-5 interspace to confirm our location.  At this point attention was turned to decompression. Complete L4 laminectomy was completed with a high-speed drill and Kerrison punches.  Normal dura was identified.  A ball-tipped dissector was then used to identify the foramina bilaterally.  High-speed drill was used to cut across the pars interarticularis and the inferior articulating process of L4 was removed bilaterally.  The exiting nerve roots and the traversing nerve roots were then identified.  Spondylitic tissue including the superior aspect of the superior articulating process of L5 and the remaining portion of the inferior articulating process of L4 were all removed with the Kerrison punches.  The foraminal portion of the exiting L4 nerve roots were then completely identified in the foramina. I was then able to easily pass a ball dissector in the ventral epidural space and underneath the bilateral L4 and L5 nerves indicating good decompression.   Disc space was then identified, incised bilaterally, and using a combination of shavers, curettes and rongeurs, complete discectomy was completed. Endplates were prepared with curettes and rasps, and bone harvested during decompression was mixed with Proteos and packed into the interspace. 34mm expandable cages were tapped into place bilaterally and expanded to achieve good endplate apposition.  Good position was confirmed with fluoroscopy.  At this point, the entry points for bilateral L4 and L5 cortical pedicle screws were identified using standard anatomic landmarks and lateral fluoro. Pilot holes were then drilled and tapped to 5.5 x 79mm. Screws were then  placed in L4 and L5.  Prebent lordotic rod was then sized and placed into the pedicle screws. Set screws were placed and final tightened.  Fluoroscopic images in AP and lateral projection were taken and the left L4 pedicle screw was noted to be lateral to the pedicle.  I therefore removed the setscrews and the rod on the left side.  The left L4 pedicle screw was removed.  The new pilot hole more superior and medial was drilled and tapped again to 5.5 x 35 mm.  The 5.5 x 35 mm screw was then replaced and the pre-bent lordotic rod was replaced.  Setscrews were then again placed and final tightened.  Further AP and lateral fluoroscopic images demonstrated good position of the pedicle screws at L4 and L5 bilaterally and good position of the cages.  Hemostasis was secured and confirmed with bipolar cautery and morcellized gelfoam with thrombin. The wound was then irrigated with copious amounts of antibiotic saline, then closed in standard fashion using a combination of interrupted 0 and 3-0 Vicryl stitches in the muscular, fascial, and subcutaneous layers. Skin was then closed using standard Dermabond. Sterile dressing was then applied. The patient was then transferred to the stretcher, extubated, and taken to the postanesthesia care unit in stable hemodynamic condition.  At the end of the case all sponge, needle, cottonoid, and instrument counts were correct.   Lisbeth Renshaw, MD [facility]

## 2020-08-10 NOTE — Progress Notes (Signed)
Orthopedic Tech Progress Note Patient Details:  Rebecca Hahn June 05, 1955 [id number] Called in order to [facility] for an ASPEN LUMBAR BRACE Patient ID: Rebecca Hahn, female   DOB: 09-22-1955, 65 y.o.   MRN: [id number]   Donald Pore 08/10/2020, 4:27 PM

## 2020-08-11 DIAGNOSIS — E119 Type 2 diabetes mellitus without complications: Secondary | ICD-10-CM | POA: Diagnosis not present

## 2020-08-11 DIAGNOSIS — Z7984 Long term (current) use of oral hypoglycemic drugs: Secondary | ICD-10-CM | POA: Diagnosis not present

## 2020-08-11 DIAGNOSIS — I10 Essential (primary) hypertension: Secondary | ICD-10-CM | POA: Diagnosis not present

## 2020-08-11 DIAGNOSIS — M47816 Spondylosis without myelopathy or radiculopathy, lumbar region: Secondary | ICD-10-CM | POA: Diagnosis not present

## 2020-08-11 DIAGNOSIS — M48062 Spinal stenosis, lumbar region with neurogenic claudication: Secondary | ICD-10-CM | POA: Diagnosis not present

## 2020-08-11 DIAGNOSIS — Z79899 Other long term (current) drug therapy: Secondary | ICD-10-CM | POA: Diagnosis not present

## 2020-08-11 DIAGNOSIS — Z880 Allergy status to penicillin: Secondary | ICD-10-CM | POA: Diagnosis not present

## 2020-08-11 DIAGNOSIS — Z87891 Personal history of nicotine dependence: Secondary | ICD-10-CM | POA: Diagnosis not present

## 2020-08-11 LAB — CBC
HCT: 31.9 % — ABNORMAL LOW (ref 36.0–46.0)
Hemoglobin: 10.1 g/dL — ABNORMAL LOW (ref 12.0–15.0)
MCH: 29.7 pg (ref 26.0–34.0)
MCHC: 31.7 g/dL (ref 30.0–36.0)
MCV: 93.8 fL (ref 80.0–100.0)
Platelets: 268 10*3/uL (ref 150–400)
RBC: 3.4 MIL/uL — ABNORMAL LOW (ref 3.87–5.11)
RDW: 16.2 % — ABNORMAL HIGH (ref 11.5–15.5)
WBC: 14.3 10*3/uL — ABNORMAL HIGH (ref 4.0–10.5)
nRBC: 0 % (ref 0.0–0.2)

## 2020-08-11 LAB — BASIC METABOLIC PANEL
Anion gap: 6 (ref 5–15)
BUN: 13 mg/dL (ref 8–23)
CO2: 31 mmol/L (ref 22–32)
Calcium: 9.6 mg/dL (ref 8.9–10.3)
Chloride: 105 mmol/L (ref 98–111)
Creatinine, Ser: 0.79 mg/dL (ref 0.44–1.00)
GFR, Estimated: >60 mL/min (ref >60)
Glucose, Bld: 149 mg/dL — ABNORMAL HIGH (ref 70–99)
Potassium: 4.4 mmol/L (ref 3.5–5.1)
Sodium: 142 mmol/L (ref 135–145)

## 2020-08-11 LAB — APTT: aPTT: 30 seconds (ref 24–36)

## 2020-08-11 LAB — GLUCOSE, CAPILLARY: Glucose-Capillary: 130 mg/dL — ABNORMAL HIGH (ref 70–99)

## 2020-08-11 LAB — PROTIME-INR
INR: 1.1 (ref 0.8–1.2)
Prothrombin Time: 13.3 seconds (ref 11.4–15.2)

## 2020-08-11 MED ORDER — ASPIRIN 81 MG PO TBEC: 81.0000 mg | DELAYED_RELEASE_TABLET | Freq: Every day | ORAL | 12 refills | Status: AC

## 2020-08-11 MED ORDER — METHOCARBAMOL 750 MG PO TABS: 750.0000 mg | ORAL_TABLET | Freq: Three times a day (TID) | ORAL | 1 refills | Status: DC | PRN

## 2020-08-11 MED ORDER — HYDROCODONE-ACETAMINOPHEN 5-325 MG PO TABS: 1.0000 | ORAL_TABLET | ORAL | 0 refills | Status: DC | PRN

## 2020-08-11 MED FILL — Thrombin (Recombinant) For Soln 5000 Unit: CUTANEOUS | Qty: 5000 | Status: AC

## 2020-08-11 NOTE — Care Management CC44 (Signed)
Condition Code 44 Documentation Completed  Patient Details  Name: Rebecca Hahn MRN: [id number] Date of Birth: 1955/05/23   Condition Code 44 given:  Yes Patient signature on Condition Code 44 notice:  Yes Documentation of 2 MD's agreement:  Yes Code 44 added to claim:  Yes    [name], RN 08/11/2020, 9:03 AM

## 2020-08-11 NOTE — Evaluation (Signed)
Occupational Therapy Evaluation Patient Details Name: Rebecca Hahn MRN: [id number] DOB: 24-May-1955 Today's Date: 08/11/2020    History of Present Illness 65 y.o. female presenting with lumbar stenosis and sponylosis s/p L4-5 laminectomy. PMHx significant for HTn, DMII, and vertigo.   Clinical Impression   PTA patient was living alone in a ground floor apartment and was Mod I with ADLs/IADLs and use of AD/DME. Patient currently presents slightly below baseline level of function demonstrating Min guard to Min A grossly for observed ADLs. OT provided education on spinal precautions, home set-up to maximize safety and independence with self-care tasks, and acquisition/use of AE. Patient expressed verbal understanding. Patient limited by deficits listed below 2/2 diagnosis above and would benefit from continued acute OT services in prep for safe d/c home with recommendation for HHOT and initial 24hr supervision/assist.     Follow Up Recommendations  Home health OT;Supervision/Assistance - 24 hour (Initially)    Equipment Recommendations  None recommended by OT (Patient has necessary DME.)    Recommendations for Other Services       Precautions / Restrictions Precautions Precautions: Fall;Back Precaution Booklet Issued: No Precaution Comments: Verbally reviewed precautions. Good return demo during observed ADLs. Required Braces or Orthoses: Spinal Brace Spinal Brace: Lumbar corset;Applied in sitting position;Applied in standing position Restrictions Weight Bearing Restrictions: No      Mobility Bed Mobility               General bed mobility comments: Patient seated EOB upon entry.    Transfers Overall transfer level: Needs assistance Equipment used: Rolling walker (2 wheeled) Transfers: Sit to/from Stand Sit to Stand: Min guard;Min assist         General transfer comment: Min A initially for boosting. 2nd trial from EOB with Min guard. Cues for hand placement.     Balance Overall balance assessment: Needs assistance Sitting-balance support: No upper extremity supported;Feet supported Sitting balance-Leahy Scale: Good     Standing balance support: Bilateral upper extremity supported;During functional activity Standing balance-Leahy Scale: Poor Standing balance comment: Reliant on bil UE support. Posterior LOB during LB dressing.                           ADL either performed or assessed with clinical judgement   ADL Overall ADL's : Needs assistance/impaired                 Upper Body Dressing : Set up;Sitting   Lower Body Dressing: Sitting/lateral leans;Minimal assistance Lower Body Dressing Details (indicate cue type and reason): Min A for steadying in standing with posterior LOB. Toilet Transfer: Immunologist Details (indicate cue type and reason): Simulated with transfer to recliner.         Functional mobility during ADLs: Min guard;Rolling walker General ADL Comments: Patient limited by decreased standing balance and pain.     Vision Patient Visual Report: No change from baseline       Perception     Praxis      Pertinent Vitals/Pain Pain Assessment: 0-10 Pain Score: 5  Pain Location: back; surgical site Pain Descriptors / Indicators: Discomfort;Grimacing;Guarding;Operative site guarding Pain Intervention(s): Limited activity within patient's tolerance;Monitored during session;Premedicated before session;Repositioned     Hand Dominance Right   Extremity/Trunk Assessment Upper Extremity Assessment Upper Extremity Assessment: Overall WFL for tasks assessed       Cervical / Trunk Assessment Cervical / Trunk Assessment: Other exceptions Cervical / Trunk Exceptions: s/p spinal surgery.  Communication Communication Communication: No difficulties   Cognition Arousal/Alertness: Awake/alert Behavior During Therapy: WFL for tasks assessed/performed Overall Cognitive Status: Within  Functional Limits for tasks assessed                                 General Comments: Likely her baseline. Pt tends to close eyes ith mobility, likely due to pain and fatigue (pt aware and admits that she does this). Pt not expressing that she should turn around to return to room due to fatigue but rather continued to ambulate until she suddenly became weak and needed to urgently get a chair to sit and rest to prevent fall, success. So possible poor deficits and safety awareness.   General Comments  Clean, dry dressing at incision.    Exercises     Shoulder Instructions      Home Living Family/patient expects to be discharged to:: Private residence Living Arrangements: Alone Available Help at Discharge: Family;Friend(s);Neighbor Type of Home: Apartment (ground floor) Home Access: Level entry     Home Layout: One level     Bathroom Shower/Tub: Chief Strategy Officer: Standard     Home Equipment: Environmental consultant - 4 wheels;Bedside commode;Tub bench;Wheelchair - manual;Grab bars - toilet;Grab bars - tub/shower (rail on bed installed)   Additional Comments: Reports hx of falling posteriorly since December.      Prior Functioning/Environment Level of Independence: Independent with assistive device(s)        Comments: Mod I with use of rollator. Pt drives.        OT Problem List: Impaired balance (sitting and/or standing);Decreased knowledge of use of DME or AE;Pain      OT Treatment/Interventions: Self-care/ADL training;Therapeutic exercise;Energy conservation;DME and/or AE instruction;Therapeutic activities;Balance training;Patient/family education    OT Goals(Current goals can be found in the care plan section) Acute Rehab OT Goals Patient Stated Goal: To return home. OT Goal Formulation: With patient Time For Goal Achievement: 08/25/20 Potential to Achieve Goals: Good ADL Goals Additional ADL Goal #1: Patient will recall 3/3 back precautions  without cues in prep for ADLs. Additional ADL Goal #2: Patient will complete a.m. ADLs with supervision A, LRAD and good carryover of spinal precautions. Additional ADL Goal #3: Patient will recall 3 fall reducing strategies in prep for safe return home.  OT Frequency: Min 2X/week   Barriers to D/C:            Co-evaluation              AM-PAC OT "6 Clicks" Daily Activity     Outcome Measure Help from another person eating meals?: None Help from another person taking care of personal grooming?: A Little Help from another person toileting, which includes using toliet, bedpan, or urinal?: A Little Help from another person bathing (including washing, rinsing, drying)?: A Little Help from another person to put on and taking off regular upper body clothing?: A Little Help from another person to put on and taking off regular lower body clothing?: A Little 6 Click Score: 19   End of Session Equipment Utilized During Treatment: Rolling walker;Back brace Nurse Communication: Mobility status  Activity Tolerance: Patient tolerated treatment well Patient left: in chair;with call bell/phone within reach  OT Visit Diagnosis: Unsteadiness on feet (R26.81);Other abnormalities of gait and mobility (R26.89);Muscle weakness (generalized) (M62.81);History of falling (Z91.81);Pain Pain - part of body:  (low back (incisional))  Time: 0722-0747 OT Time Calculation (min): 25 min Charges:  OT General Charges $OT Visit: 1 Visit OT Evaluation $OT Eval Low Complexity: 1 Low OT Treatments $Self Care/Home Management : 8-22 mins  Eileen Kangas H. OTR/L Supplemental OT, Department of rehab services [phone]  Thekla Colborn R H. 08/11/2020, 8:21 AM

## 2020-08-11 NOTE — Plan of Care (Signed)
Patient alert and oriented, voiding adequately, MAE well with no difficulty. Incision area cdi with no s/s of infection. Patient discharged home per order. Patient and daughter stated understanding of discharge instructions given. Patient has all belongings with her.Patient has an appointment with Dr. Conchita Paris.

## 2020-08-11 NOTE — Progress Notes (Signed)
Physical Therapy Treatment Patient Details Name: Rebecca Hahn MRN: [id number] DOB: [birth year] Today's Date: 08/11/2020    History of Present Illness Pt is a 65 y/o female presenting with lumbar stenosis and sponylosis s/p L4-5 laminectomy on 08/10/2020. PMH significant for HTn, DMII, and vertigo.    PT Comments    Pt progressing well with post-op mobility. She was able to demonstrate transfers and ambulation with gross min guard assist for safety and RW for support. Pt was educated on precautions, brace application/wearing schedule, appropriate activity progression, and car transfer. Will continue to follow.     Follow Up Recommendations  No PT follow up;Supervision for mobility/OOB (plan is for daughter to stay with her initially)     Equipment Recommendations  None recommended by PT    Recommendations for Other Services       Precautions / Restrictions Precautions Precautions: Fall;Back Precaution Booklet Issued: No Precaution Comments: Verbally reviewed precautions. Good return demo during observed ADLs. Required Braces or Orthoses: Spinal Brace Spinal Brace: Lumbar corset;Applied in sitting position Restrictions Weight Bearing Restrictions: No    Mobility  Bed Mobility               General bed mobility comments: Pt received sitting up in the recliner.    Transfers Overall transfer level: Needs assistance Equipment used: Rolling walker (2 wheeled) Transfers: Sit to/from Stand Sit to Stand: Min guard         General transfer comment: Close guard for safety as pt powered up to full stand. Initially 1 posterior LOB back onto the chair but on second attempt no assist required and no LOB noted.  Ambulation/Gait Ambulation/Gait assistance: Min guard Gait Distance (Feet): 200 Feet Assistive device: Rolling walker (2 wheeled) Gait Pattern/deviations: Step-through pattern;Decreased step length - right;Decreased step length - left;Decreased stride length Gait  velocity: Decreased Gait velocity interpretation: <1.31 ft/sec, indicative of household ambulator General Gait Details: Slow but generally steady with the RW for support. Min guard assist provided throughout for safety. 1 standing rest break while leaning up against the wall but otherwise tolerated well.   Stairs             Wheelchair Mobility    Modified Rankin (Stroke Patients Only)       Balance Overall balance assessment: Needs assistance Sitting-balance support: No upper extremity supported;Feet supported Sitting balance-Leahy Scale: Good     Standing balance support: Bilateral upper extremity supported;During functional activity Standing balance-Leahy Scale: Poor Standing balance comment: Reliant on UE support                            Cognition Arousal/Alertness: Awake/alert Behavior During Therapy: WFL for tasks assessed/performed Overall Cognitive Status: Within Functional Limits for tasks assessed                                 General Comments: Likely her baseline. Pt tends to close eyes ith mobility, likely due to pain and fatigue (pt aware and admits that she does this). Pt not expressing that she should turn around to return to room due to fatigue but rather continued to ambulate until she suddenly became weak and needed to urgently get a chair to sit and rest to prevent fall, success. So possible poor deficits and safety awareness.      Exercises      General Comments General comments (skin integrity, edema, etc.):  Clean, dry dressing at incision.      Pertinent Vitals/Pain Pain Assessment: 0-10 Pain Score: 5  Pain Location: back; surgical site Pain Descriptors / Indicators: Discomfort;Grimacing;Guarding;Operative site guarding Pain Intervention(s): Limited activity within patient's tolerance;Monitored during session;Repositioned    Home Living Family/patient expects to be discharged to:: Private residence Living  Arrangements: Alone Available Help at Discharge: Family;Friend(s);Neighbor Type of Home: Apartment (ground floor) Home Access: Level entry   Home Layout: One level Home Equipment: Walker - 4 wheels;Bedside commode;Tub bench;Wheelchair - manual;Grab bars - toilet;Grab bars - tub/shower (rail on bed installed) Additional Comments: Reports hx of falling posteriorly since December.    Prior Function Level of Independence: Independent with assistive device(s)      Comments: Mod I with use of rollator. Pt drives.   PT Goals (current goals can now be found in the care plan section) Acute Rehab PT Goals Patient Stated Goal: To return home. PT Goal Formulation: With patient Time For Goal Achievement: 08/24/20 Potential to Achieve Goals: Good Progress towards PT goals: Progressing toward goals    Frequency    Min 5X/week      PT Plan Current plan remains appropriate    Co-evaluation              AM-PAC PT "6 Clicks" Mobility   Outcome Measure  Help needed turning from your back to your side while in a flat bed without using bedrails?: A Little Help needed moving from lying on your back to sitting on the side of a flat bed without using bedrails?: A Little Help needed moving to and from a bed to a chair (including a wheelchair)?: A Little Help needed standing up from a chair using your arms (e.g., wheelchair or bedside chair)?: A Little Help needed to walk in hospital room?: A Little Help needed climbing 3-5 steps with a railing? : A Little 6 Click Score: 18    End of Session Equipment Utilized During Treatment: Gait belt Activity Tolerance: Patient limited by fatigue;Patient limited by pain Patient left: in bed;with call bell/phone within reach;with bed alarm set Nurse Communication: Mobility status PT Visit Diagnosis: Unsteadiness on feet (R26.81);Other abnormalities of gait and mobility (R26.89);Muscle weakness (generalized) (M62.81);History of falling  (Z91.81);Difficulty in walking, not elsewhere classified (R26.2)     Time: 5366-4403 PT Time Calculation (min) (ACUTE ONLY): 16 min  Charges:  $Gait Training: 8-22 mins                     Conni Slipper, PT, DPT Acute Rehabilitation Services Pager: [phone] Office: [phone]    Marylynn Pearson 08/11/2020, 9:14 AM

## 2020-08-11 NOTE — Progress Notes (Signed)
  NEUROSURGERY PROGRESS NOTE   No issues overnight Complains of appropriate back soreness Pre op symptoms much improved Ambulating well. Voiding normal. Tolerating po. Ready for discharge  EXAM:  BP (!) 99/54 (BP Location: Right Arm)   Pulse (!) 102   Temp 97.9 F (36.6 C) (Oral)   Resp 18   Ht 5\' 6"  (1.676 m)   Wt 120.6 kg   SpO2 93%   BMI 42.91 kg/m   Awake, alert, oriented  Speech fluent, appropriate  CN grossly intact  5/5 BUE/BLE  Incision: c/d/i  IMPRESSION/PLAN 65 y.o. female POD1 L4-5 fusion. Doing well. - discharge home

## 2020-08-11 NOTE — TOC Initial Note (Addendum)
Transition of Care [facility]) - Initial/Assessment Note    Patient Details  Name: Rebecca Hahn MRN: [id number] Date of Birth: [birth year]  Transition of Care [facility]) CM/SW Contact:    Joanne Chars, LCSW Phone Number: 08/11/2020, 12:37 PM  Clinical Narrative:  CSW met with daughter regarding referral for [facility].  Pt lives alone, has family support.  Has HH aide currently providing services.  Agreeable to [facility], choice document provided, no preference of providers.  Pt is vaccinated for covid and boosted.  Pt was discharging, daughter present to provide ride, pt requested CSW call her with provider.     [facility] accepts referral.  CSW spoke with pt by phone with information.  She asked that CSW text the name and number, which was done.                Expected Discharge Plan: [place] Barriers to Discharge: No Barriers Identified   Patient Goals and CMS Choice   CMS Medicare.gov Compare Post Acute Care list provided to:: Patient Choice offered to / list presented to : Patient  Expected Discharge Plan and Services Expected Discharge Plan: [place] Choice: [place] arrangements for the past 2 months: Single Family Home Expected Discharge Date: 08/11/20               DME Arranged: N/A (DME by [facility] staff)         HH Arranged: PT,OT [place] Agency: [place] Date [facility] Agency Contacted: 08/11/20 Time HH Agency Contacted: 10 Representative spoke with at [place]: Malachy Mood  Prior Living Arrangements/Services Living arrangements for the past 2 months: [place] with:: Self Patient language and need for interpreter reviewed:: Yes Do you feel safe going back to the place where you live?: Yes      Need for Family Participation in Patient Care: No (Comment) Care giver support system in place?: Yes (comment) Current home services: Homehealth aide Criminal Activity/Legal Involvement Pertinent to  Current Situation/Hospitalization: No - Comment as needed  Activities of Daily Living      Permission Sought/Granted                  Emotional Assessment Appearance:: Appears stated age Attitude/Demeanor/Rapport: Engaged Affect (typically observed): Appropriate,Pleasant Orientation: : Oriented to Self,Oriented to Place,Oriented to  Time,Oriented to Situation Alcohol / Substance Use: Not Applicable Psych Involvement: No (comment)  Admission diagnosis:  Lumbar spinal stenosis [Y69.485] Patient Active Problem List   Diagnosis Date Noted  . Lumbar spinal stenosis 08/10/2020   PCP:  [name], MD Pharmacy:   [facility] [place], [address] [address] Phone: [phone] Fax: [phone]     Social Determinants of Health (SDOH) Interventions    Readmission Risk Interventions No flowsheet data found.

## 2020-08-11 NOTE — Care Management Obs Status (Signed)
MEDICARE OBSERVATION STATUS NOTIFICATION   Patient Details  Name: Rebecca Hahn MRN: [id number] Date of Birth: Jul 03, 1955   Medicare Observation Status Notification Given:  Yes    [name], RN 08/11/2020, 9:03 AM

## 2020-08-11 NOTE — Discharge Summary (Signed)
Physician Discharge Summary  Patient ID: Rebecca Hahn MRN: [id number] DOB/AGE: [birth year] 66 y.o.  Admit date: 08/10/2020 Discharge date: 08/11/2020  Admission Diagnoses:  Lumbar spinal stenosis  Discharge Diagnoses:  Same Active Problems:   Lumbar spinal stenosis   Discharged Condition: Stable  Hospital Course:  Felipe Cabell Lowrey is a 65 y.o. female who was admitted for the below procedure. There were no post operative complications. At time of discharge, pain was well controlled, ambulating with Pt/OT, tolerating po, voiding normal. Ready for discharge.  Treatments: Surgery - L4-5 PLIF  Discharge Exam: Blood pressure (!) 99/54, pulse (!) 102, temperature 97.9 F (36.6 C), temperature source Oral, resp. rate 18, height 5\' 6"  (1.676 m), weight 120.6 kg, SpO2 93 %. Awake, alert, oriented Speech fluent, appropriate CN grossly intact 5/5 BUE/BLE Wound c/d/i  Disposition: Home with [facility]  Discharge Instructions    Call MD for:  difficulty breathing, headache or visual disturbances   Complete by: As directed    Call MD for:  persistant dizziness or light-headedness   Complete by: As directed    Call MD for:  redness, tenderness, or signs of infection (pain, swelling, redness, odor or green/yellow discharge around incision site)   Complete by: As directed    Call MD for:  severe uncontrolled pain   Complete by: As directed    Call MD for:  temperature >100.4   Complete by: As directed    Driving Restrictions   Complete by: As directed    Do not drive until given clearance.   Increase activity slowly   Complete by: As directed    Lifting restrictions   Complete by: As directed    Do not lift anything >10lbs. Avoid bending and twisting in awkward positions. Avoid bending at the back.   May shower / Bathe   Complete by: As directed    In 24 hours. Okay to wash wound with warm soapy water. Avoid scrubbing the wound. Pat dry.   Remove dressing in 48 hours   Complete by: As  directed      Allergies as of 08/11/2020      Reactions   Penicillins Rash   Has patient had a PCN reaction causing immediate rash, facial/tongue/throat swelling, SOB or lightheadedness with hypotension: No Has patient had a PCN reaction causing severe rash involving mucus membranes or skin necrosis: No Has patient had a PCN reaction that required hospitalization: No Has patient had a PCN reaction occurring within the last 10 years: No If all of the above answers are "NO", then may proceed with Cephalosporin use.      Medication List    TAKE these medications   acetaminophen 500 MG tablet Commonly known as: TYLENOL Take 1,000 mg by mouth in the morning and at bedtime.   albuterol 108 (90 Base) MCG/ACT inhaler Commonly known as: VENTOLIN HFA Inhale 1-2 puffs into the lungs every 6 (six) hours as needed for wheezing or shortness of breath.   allopurinol 100 MG tablet Commonly known as: ZYLOPRIM Take 200 mg by mouth daily.   aspirin 81 MG EC tablet Take 1 tablet (81 mg total) by mouth daily. Start taking on: August 17, 2020 What changed: These instructions start on August 17, 2020. If you are unsure what to do until then, ask your doctor or other care provider.   Carboxymethylcellulose Sodium 1 % Gel Place 1 drop into both eyes 4 (four) times daily as needed (dry eyes).   Centrum Silver 50+Women Tabs Take 1  tablet by mouth daily.   clonazePAM 0.5 MG tablet Commonly known as: KLONOPIN Take 1 tablet (0.5 mg total) by mouth 2 (two) times daily. What changed:   how much to take  when to take this   fluticasone 50 MCG/ACT nasal spray Commonly known as: FLONASE Place 1 spray into both nostrils daily as needed for allergies or rhinitis.   gabapentin 100 MG capsule Commonly known as: NEURONTIN Take 100-200 mg by mouth See admin instructions. 100 mg in the morning, 200 mg at bedtime   HAIR SKIN & NAILS GUMMIES PO Take 2 each by mouth daily.   HYDROcodone-acetaminophen  5-325 MG tablet Commonly known as: NORCO/VICODIN Take 1 tablet by mouth every 4 (four) hours as needed for moderate pain.   HYPOCHLOROUS ACID EX Apply 0.2 % topically in the morning and at bedtime. Spray eyelids   ketotifen 0.025 % ophthalmic solution Commonly known as: ZADITOR Place 1 drop into both eyes 2 (two) times daily.   leflunomide 10 MG tablet Commonly known as: ARAVA Take 10 mg by mouth daily.   Lifitegrast 5 % Soln Place 1 drop into both eyes in the morning and at bedtime.   loratadine 10 MG tablet Commonly known as: CLARITIN Take 10 mg by mouth daily.   meclizine 12.5 MG tablet Commonly known as: ANTIVERT Take 12.5-25 mg by mouth See admin instructions. 12.5 mg in the morning, 25 mg at bedtime   melatonin 3 MG Tabs tablet Take 6 mg by mouth at bedtime.   metFORMIN 500 MG tablet Commonly known as: GLUCOPHAGE Take 500 mg by mouth daily with breakfast.   methocarbamol 750 MG tablet Commonly known as: Robaxin-750 Take 1 tablet (750 mg total) by mouth 3 (three) times daily as needed for muscle spasms.   naproxen 500 MG tablet Commonly known as: NAPROSYN Take 500 mg by mouth 2 (two) times daily with a meal.   omeprazole 20 MG capsule Commonly known as: PRILOSEC Take 20 mg by mouth daily.   promethazine 25 MG tablet Commonly known as: PHENERGAN Take 25 mg by mouth 2 (two) times daily as needed for nausea or vomiting.   rosuvastatin 20 MG tablet Commonly known as: CRESTOR Take 20 mg by mouth every evening.   sertraline 100 MG tablet Commonly known as: ZOLOFT Take 1 tablet (100 mg total) by mouth at bedtime. What changed: how much to take   SUMAtriptan 100 MG tablet Commonly known as: IMITREX Take 100 mg by mouth every 2 (two) hours as needed for migraine. May repeat in 2 hours if headache persists or recurs.   traZODone 100 MG tablet Commonly known as: DESYREL Take 1 tablet (100 mg total) by mouth at bedtime.       Follow-up Information     Lisbeth Renshaw, MD. Schedule an appointment as soon as possible for a visit in 3 week(s).   Specialty: Neurosurgery Contact information: [address] [place] [address] [phone]               Signed: Alyson Ingles 08/11/2020, 7:38 AM

## 2020-08-20 DIAGNOSIS — G8929 Other chronic pain: Secondary | ICD-10-CM | POA: Diagnosis not present

## 2020-08-20 DIAGNOSIS — M545 Low back pain, unspecified: Secondary | ICD-10-CM | POA: Diagnosis not present

## 2020-08-20 DIAGNOSIS — Z981 Arthrodesis status: Secondary | ICD-10-CM | POA: Diagnosis not present

## 2020-08-20 DIAGNOSIS — Z4789 Encounter for other orthopedic aftercare: Secondary | ICD-10-CM | POA: Diagnosis not present

## 2020-08-23 DIAGNOSIS — M48062 Spinal stenosis, lumbar region with neurogenic claudication: Secondary | ICD-10-CM | POA: Diagnosis not present

## 2020-09-29 DIAGNOSIS — F419 Anxiety disorder, unspecified: Secondary | ICD-10-CM | POA: Diagnosis not present

## 2020-09-29 DIAGNOSIS — E1165 Type 2 diabetes mellitus with hyperglycemia: Secondary | ICD-10-CM | POA: Diagnosis not present

## 2020-09-29 DIAGNOSIS — M25511 Pain in right shoulder: Secondary | ICD-10-CM | POA: Diagnosis not present

## 2020-10-20 ENCOUNTER — Other Ambulatory Visit: Payer: Self-pay

## 2020-10-20 ENCOUNTER — Ambulatory Visit
Admission: RE | Admit: 2020-10-20 | Discharge: 2020-10-20 | Disposition: A | Discharge status: Home / Self Care | Payer: Medicare Other | Source: Ambulatory Visit | Attending: Family Medicine | Admitting: Family Medicine

## 2020-10-20 DIAGNOSIS — Z1231 Encounter for screening mammogram for malignant neoplasm of breast: Secondary | ICD-10-CM

## 2020-10-23 IMAGING — MR MR LUMBAR SPINE W/O CM
4 of 5 series · 18 of 48 positions shown · non-contrast
Comparison: None.

CLINICAL DATA: Low back pain.  Painful bending over

EXAM:
MRI LUMBAR SPINE WITHOUT CONTRAST
TECHNIQUE: Multiplanar, multisequence MR imaging of the lumbar spine was
performed. No intravenous contrast was administered.

[Series 9: T2 · sagittal · 4.0mm · 0.73mm/px · 6 of 15 slices shown (1 of 2)]
[im 1/15]
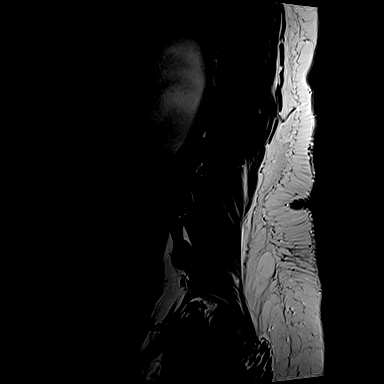
[im 3/15]
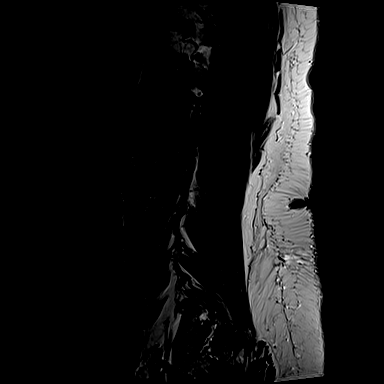
[im 6/15]
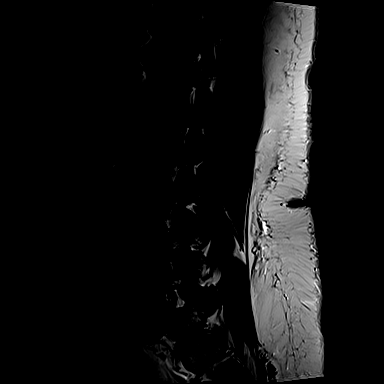
[im 9/15]
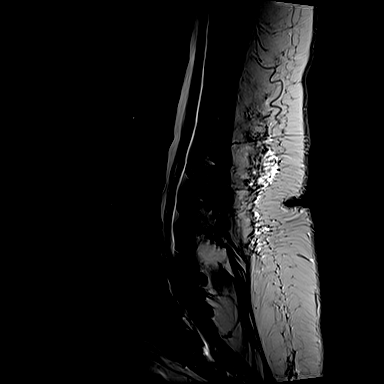
[im 12/15]
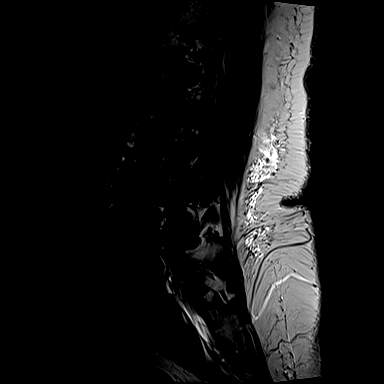
[im 15/15]
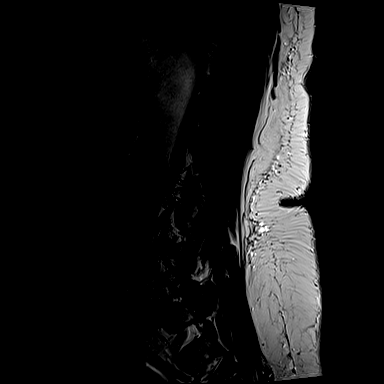

[Series 10: T1 · sagittal · 4.0mm · 0.73mm/px · 3 of 15 slices shown (1 of 2)]
[im 3/15]
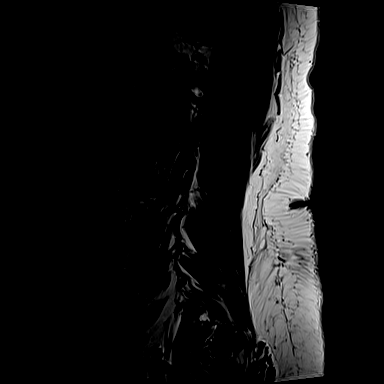
[im 9/15]
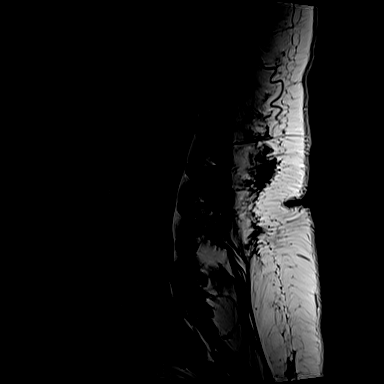
[im 15/15]
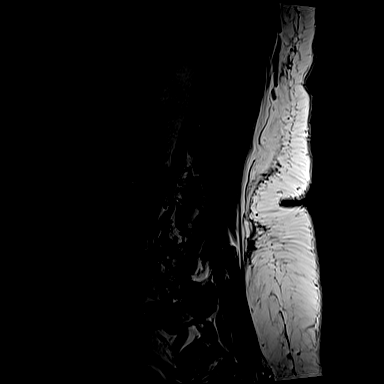

[Series 14: T1 · axial · 4.0mm · 0.28mm/px · z∈[-150,-11]mm · 3 of 39 slices shown (2 of 2)]
[im 6/39]
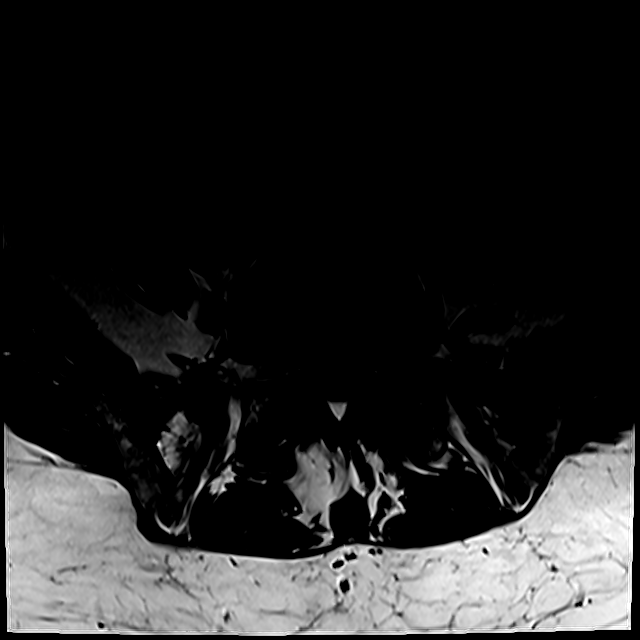
[im 20/39]
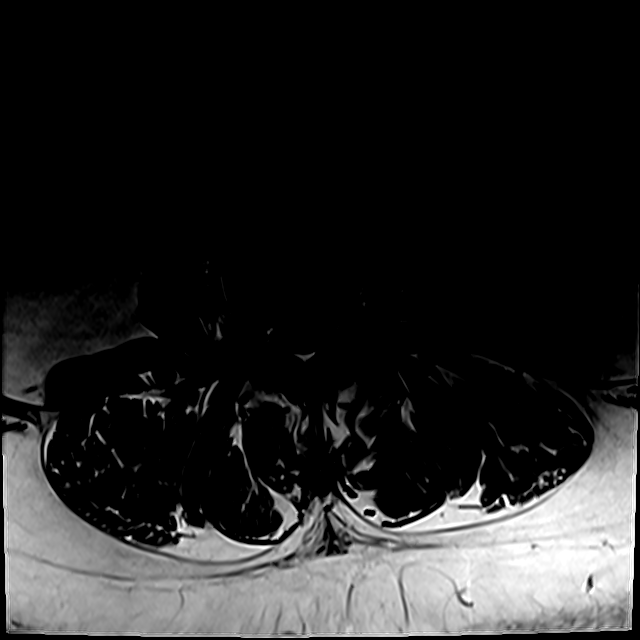
[im 33/39]
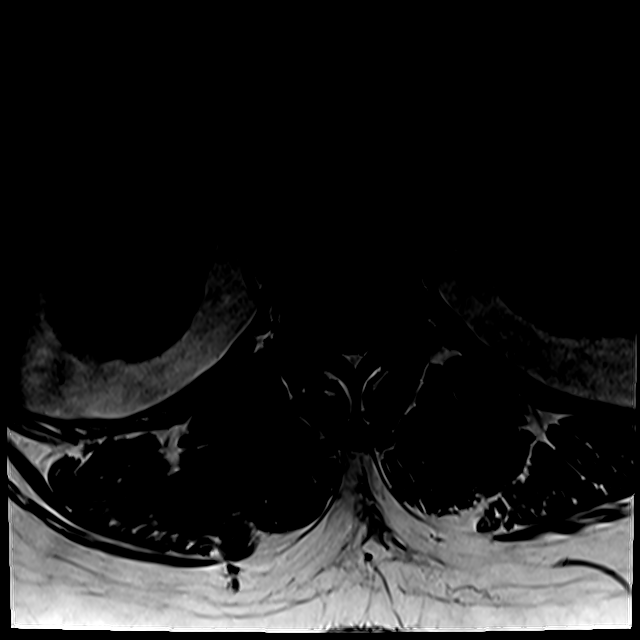

[Series 17: T2 · axial · 4.0mm · 0.28mm/px · z∈[-175,-11]mm · 6 of 39 slices shown (2 of 2)]
[im 1/39]
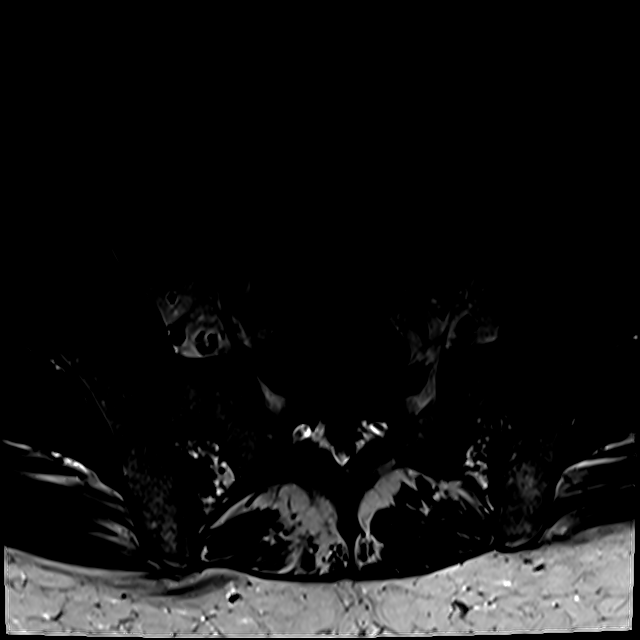
[im 6/39]
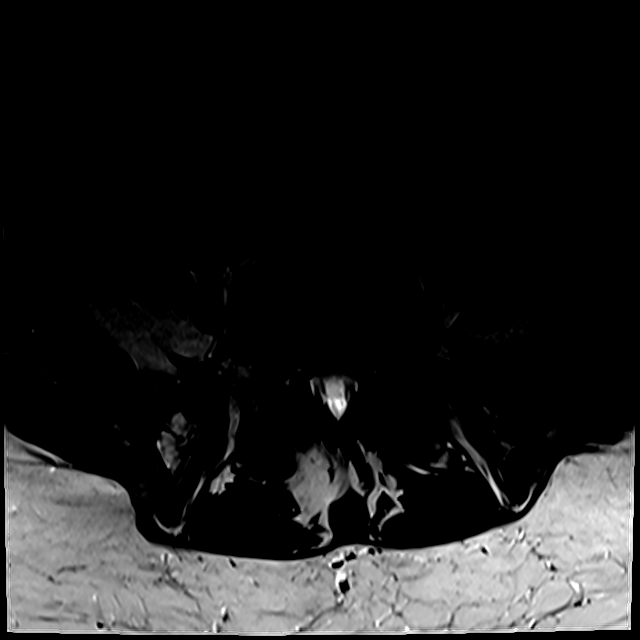
[im 11/39]
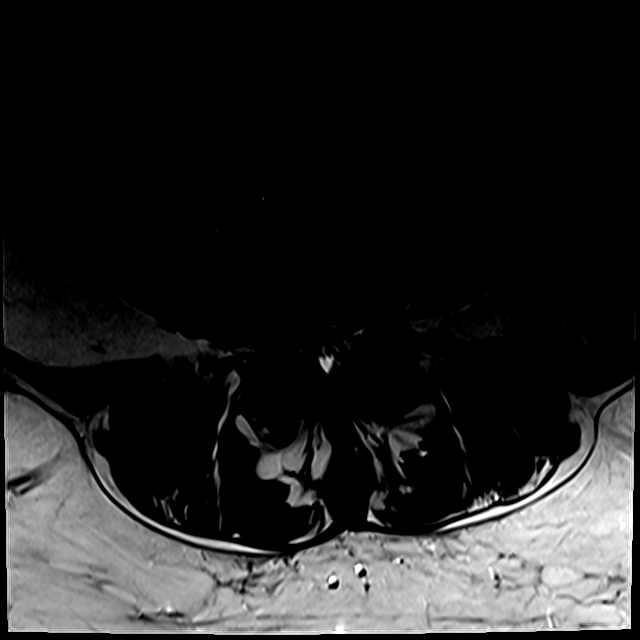
[im 17/39]
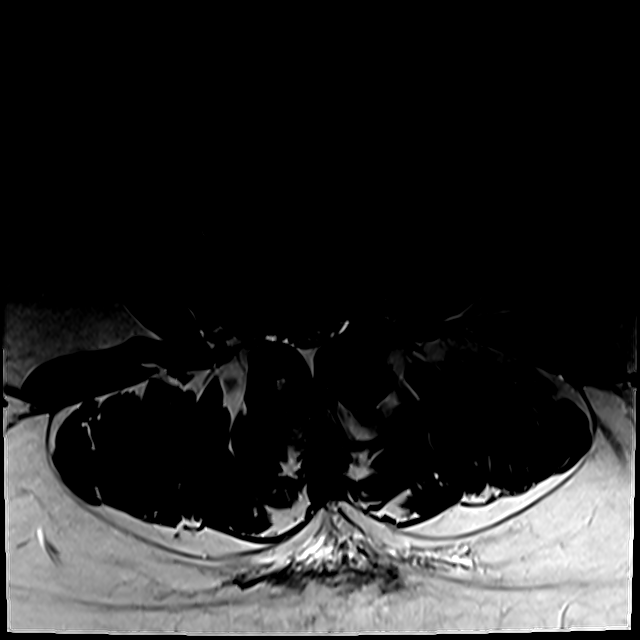
[im 20/39]
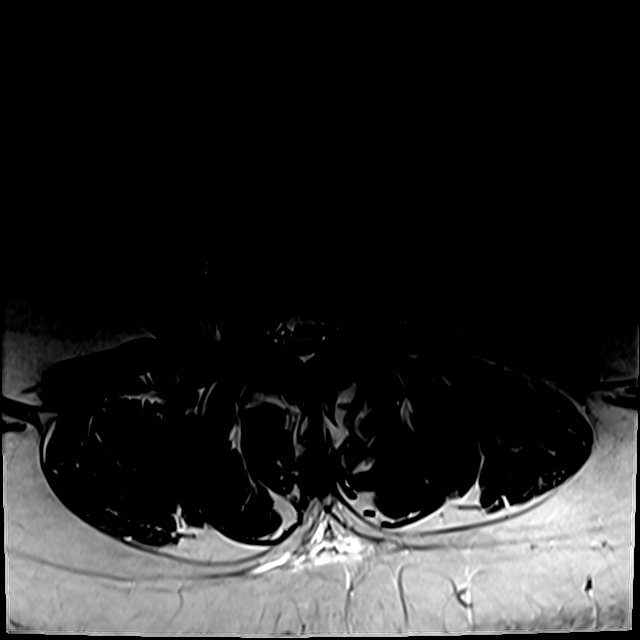
[im 33/39]
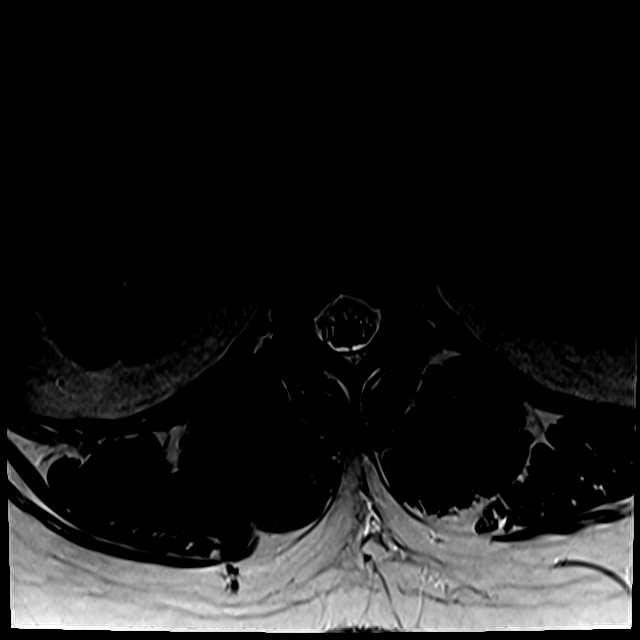

[18 of 48 positions shown; findings below may reference images not displayed]

FINDINGS: Segmentation: Spinal numbering based on the lowest visible ribs
which are placed at T12. Rib counting is not well established on a
8699 chest x-ray. Rudimentary disc space at the level numbered S1-2.

Alignment:  Physiologic.

Vertebrae:  No fracture, evidence of discitis, or bone lesion.

Conus medullaris and cauda equina: Conus extends to the L2-3 disc
level. Conus and cauda equina appear normal. No evidence of
tethering mass.

Paraspinal and other soft tissues: Unremarkable

Disc levels:

T12- L1: Unremarkable.

L1-L2: Spondylosis and mild disc bulging.

L2-L3: Disc narrowing and bulging. Degenerative facet spurring both
sides. There is a left foraminal to far-lateral protrusion impinging
on the L2 nerve root. Patent spinal canal

L3-L4: Disc narrowing and bulging. Degenerative facet spurring on
both sides which is bulky. Mild to moderate thecal sac narrowing.
Patent foramina

L4-L5: Disc narrowing and right eccentric bulging. Facet
osteoarthritis with bulky spurring. Moderate to advanced spinal
stenosis with essentially complete effacement of CSF. Right
foraminal impingement with no visible perineural fat.

L5-S1:Disc narrowing with left foraminal protrusion and buttressing
osteophyte. Facet osteoarthritis with bulky spurring. Left more than
right noncompressive foraminal narrowing.
IMPRESSION: 1. Diffuse disc and facet degeneration as described. Facet
osteoarthritis is particularly advanced at L3-4 and below.
2. L4-5 moderate to advanced spinal stenosis with asymmetric right
foraminal impingement.
3. L3-4 mild to moderate spinal stenosis.
4. Low conus termination at the L2-3 disc space, but no tethering
mass seen.

## 2020-10-27 ENCOUNTER — Encounter: Payer: Self-pay | Admitting: Neurology

## 2020-11-10 DIAGNOSIS — H90A21 Sensorineural hearing loss, unilateral, right ear, with restricted hearing on the contralateral side: Secondary | ICD-10-CM | POA: Diagnosis not present

## 2020-11-10 DIAGNOSIS — H9313 Tinnitus, bilateral: Secondary | ICD-10-CM | POA: Diagnosis not present

## 2020-11-10 DIAGNOSIS — H90A32 Mixed conductive and sensorineural hearing loss, unilateral, left ear with restricted hearing on the contralateral side: Secondary | ICD-10-CM | POA: Diagnosis not present

## 2020-11-11 DIAGNOSIS — H04123 Dry eye syndrome of bilateral lacrimal glands: Secondary | ICD-10-CM | POA: Diagnosis not present

## 2020-11-24 DIAGNOSIS — Z6841 Body Mass Index (BMI) 40.0 and over, adult: Secondary | ICD-10-CM | POA: Diagnosis not present

## 2020-11-24 DIAGNOSIS — M48062 Spinal stenosis, lumbar region with neurogenic claudication: Secondary | ICD-10-CM | POA: Diagnosis not present

## 2020-11-24 DIAGNOSIS — R030 Elevated blood-pressure reading, without diagnosis of hypertension: Secondary | ICD-10-CM | POA: Diagnosis not present

## 2020-11-25 DIAGNOSIS — M5386 Other specified dorsopathies, lumbar region: Secondary | ICD-10-CM | POA: Diagnosis not present

## 2020-11-25 DIAGNOSIS — M9905 Segmental and somatic dysfunction of pelvic region: Secondary | ICD-10-CM | POA: Diagnosis not present

## 2020-11-25 DIAGNOSIS — M5137 Other intervertebral disc degeneration, lumbosacral region: Secondary | ICD-10-CM | POA: Diagnosis not present

## 2020-11-25 DIAGNOSIS — M9903 Segmental and somatic dysfunction of lumbar region: Secondary | ICD-10-CM | POA: Diagnosis not present

## 2020-11-29 DIAGNOSIS — D259 Leiomyoma of uterus, unspecified: Secondary | ICD-10-CM | POA: Diagnosis not present

## 2020-12-03 DIAGNOSIS — N882 Stricture and stenosis of cervix uteri: Secondary | ICD-10-CM | POA: Diagnosis not present

## 2020-12-03 DIAGNOSIS — N950 Postmenopausal bleeding: Secondary | ICD-10-CM | POA: Diagnosis not present

## 2020-12-07 ENCOUNTER — Other Ambulatory Visit: Payer: Self-pay

## 2020-12-07 DIAGNOSIS — R202 Paresthesia of skin: Secondary | ICD-10-CM

## 2020-12-08 ENCOUNTER — Ambulatory Visit (INDEPENDENT_AMBULATORY_CARE_PROVIDER_SITE_OTHER): Payer: No Typology Code available for payment source | Admitting: Neurology

## 2020-12-08 ENCOUNTER — Other Ambulatory Visit: Payer: Self-pay

## 2020-12-08 DIAGNOSIS — R202 Paresthesia of skin: Secondary | ICD-10-CM | POA: Diagnosis not present

## 2020-12-08 DIAGNOSIS — G5603 Carpal tunnel syndrome, bilateral upper limbs: Secondary | ICD-10-CM

## 2020-12-08 NOTE — Procedures (Signed)
[facility] Neurology  [address] [place], [address]  [place], [place] [address] Tel: [phone] Fax:  [phone] Test Date:  12/08/2020  Patient: Rebecca Hahn DOB: May 21, 1956 Physician: Nita Sickle, DO  Sex: Female Height: 5\' 6"  Ref Phys: , MD  ID#: [id number]   Technician:    Patient Complaints: This is a 65 year old female referred for evaluation of bilateral hand paresthesias, worse on the right.  NCV & EMG Findings: Extensive electrodiagnostic testing of the right upper extremity and additional studies of the left shows:  Right median sensory response is absent.  Left median sensory responses show prolonged latency (4.2 ms) and reduced amplitude (7.2 V).  Bilateral ulnar sensory responses are within normal limits. Right median motor response is absent.  Left median motor response shows prolonged latency (4.9 ms).  Of note, there is evidence of bilateral Martin-Gruber anastomoses, a normal anatomic variant.  Bilateral ulnar motor responses are within normal limits. Active on chronic motor axonal loss changes are seen affecting the right abductor pollicis brevis muscle.  These findings are not present on the left side.  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   Left median neuropathy at or distal to the wrist (moderate), consistent with a clinical diagnosis of carpal tunnel syndrome.    ___________________________ 75, DO    Nerve Conduction Studies Anti Sensory Summary Table   Stim Site NR Peak (ms) Norm Peak (ms) P-T Amp (V) Norm P-T Amp  Left Median Anti Sensory (2nd Digit)  33C  Wrist    4.2 <3.8 7.2 >10  Right Median Anti Sensory (2nd Digit)  33C  Wrist NR  <3.8  >10  Left Ulnar Anti Sensory (5th Digit)  33C  Wrist    2.7 <3.2 22.5 >5  Right Ulnar Anti Sensory (5th Digit)  33C  Wrist    2.5 <3.2 16.7 >5   Motor Summary Table   Stim Site  NR Onset (ms) Norm Onset (ms) O-P Amp (mV) Norm O-P Amp Site1 Site2 Delta-0 (ms) Dist (cm) Vel (m/s) Norm Vel (m/s)  Left Median Motor (Abd Poll Brev)  33C  Wrist    4.9 <4.0 7.3 >5 Elbow Wrist 5.8 30.0 52 >50  Elbow    10.7  6.7  Ulnar-wrist crossover Elbow 4.6 0.0    Ulnar-wrist crossover    6.1  1.9         Right Median Motor (Abd Poll Brev)  33C  Wrist NR  <4.0  >5 Elbow Wrist  0.0  >50  Elbow NR     Ulnar-wrist crossover Elbow  0.0    Ulnar-wrist crossover    4.1  2.8         Left Ulnar Motor (Abd Dig Minimi)  33C  Wrist    2.2 <3.1 9.0 >7 B Elbow Wrist 4.0 22.0 55 >50  B Elbow    6.2  8.3  A Elbow B Elbow 1.9 10.0 53 >50  A Elbow    8.1  7.5         Right Ulnar Motor (Abd Dig Minimi)  33C  Wrist    2.1 <3.1 9.3 >7 B Elbow Wrist 4.1 22.0 54 >50  B Elbow    6.2  8.9  A Elbow B Elbow 1.8 10.0 56 >50  A Elbow    8.0  8.7          EMG   Side Muscle  Ins Act Fibs Psw Fasc Number Recrt Dur Dur. Amp Amp. Poly Poly. Comment  Right 1stDorInt Nml Nml Nml Nml Nml Nml Nml Nml Nml Nml Nml Nml N/A  Right Abd Poll Brev Nml 2+ Nml Nml SMU Rapid All 1+ All 1+ All 1+ ATR  Right PronatorTeres Nml Nml Nml Nml Nml Nml Nml Nml Nml Nml Nml Nml N/A  Right Biceps Nml Nml Nml Nml Nml Nml Nml Nml Nml Nml Nml Nml N/A  Right Triceps Nml Nml Nml Nml Nml Nml Nml Nml Nml Nml Nml Nml N/A  Right Deltoid Nml Nml Nml Nml Nml Nml Nml Nml Nml Nml Nml Nml N/A  Left Abd Poll Brev Nml Nml Nml Nml Nml Nml Nml Nml Nml Nml Nml Nml N/A  Left 1stDorInt Nml Nml Nml Nml Nml Nml Nml Nml Nml Nml Nml Nml N/A  Left PronatorTeres Nml Nml Nml Nml Nml Nml Nml Nml Nml Nml Nml Nml N/A  Left Biceps Nml Nml Nml Nml Nml Nml Nml Nml Nml Nml Nml Nml N/A  Left Triceps Nml Nml Nml Nml Nml Nml Nml Nml Nml Nml Nml Nml N/A  Left Deltoid Nml Nml Nml Nml Nml Nml Nml Nml Nml Nml Nml Nml N/A      Waveforms:

## 2020-12-15 DIAGNOSIS — M25511 Pain in right shoulder: Secondary | ICD-10-CM | POA: Diagnosis not present

## 2020-12-15 DIAGNOSIS — M1990 Unspecified osteoarthritis, unspecified site: Secondary | ICD-10-CM | POA: Diagnosis not present

## 2020-12-15 DIAGNOSIS — M3500 Sicca syndrome, unspecified: Secondary | ICD-10-CM | POA: Diagnosis not present

## 2020-12-15 DIAGNOSIS — M79643 Pain in unspecified hand: Secondary | ICD-10-CM | POA: Diagnosis not present

## 2020-12-15 DIAGNOSIS — G5600 Carpal tunnel syndrome, unspecified upper limb: Secondary | ICD-10-CM | POA: Diagnosis not present

## 2020-12-15 DIAGNOSIS — Z79899 Other long term (current) drug therapy: Secondary | ICD-10-CM | POA: Diagnosis not present

## 2021-01-13 DIAGNOSIS — E1165 Type 2 diabetes mellitus with hyperglycemia: Secondary | ICD-10-CM | POA: Diagnosis not present

## 2021-01-13 DIAGNOSIS — R072 Precordial pain: Secondary | ICD-10-CM | POA: Diagnosis not present

## 2021-01-13 DIAGNOSIS — M3501 Sicca syndrome with keratoconjunctivitis: Secondary | ICD-10-CM | POA: Diagnosis not present

## 2021-01-13 DIAGNOSIS — R0602 Shortness of breath: Secondary | ICD-10-CM | POA: Diagnosis not present

## 2021-01-20 DIAGNOSIS — R0602 Shortness of breath: Secondary | ICD-10-CM | POA: Diagnosis not present

## 2021-01-25 DIAGNOSIS — G4730 Sleep apnea, unspecified: Secondary | ICD-10-CM | POA: Diagnosis not present

## 2021-01-25 DIAGNOSIS — M4726 Other spondylosis with radiculopathy, lumbar region: Secondary | ICD-10-CM | POA: Diagnosis not present

## 2021-01-25 DIAGNOSIS — M48061 Spinal stenosis, lumbar region without neurogenic claudication: Secondary | ICD-10-CM | POA: Diagnosis not present

## 2021-01-25 DIAGNOSIS — E119 Type 2 diabetes mellitus without complications: Secondary | ICD-10-CM | POA: Diagnosis not present

## 2021-02-01 ENCOUNTER — Encounter: Payer: Federal, State, Local not specified - PPO | Admitting: Obstetrics & Gynecology

## 2021-02-01 DIAGNOSIS — E1165 Type 2 diabetes mellitus with hyperglycemia: Secondary | ICD-10-CM | POA: Diagnosis not present

## 2021-02-01 DIAGNOSIS — R072 Precordial pain: Secondary | ICD-10-CM | POA: Diagnosis not present

## 2021-02-01 DIAGNOSIS — Z23 Encounter for immunization: Secondary | ICD-10-CM | POA: Diagnosis not present

## 2021-02-01 DIAGNOSIS — M3501 Sicca syndrome with keratoconjunctivitis: Secondary | ICD-10-CM | POA: Diagnosis not present

## 2021-02-01 NOTE — Progress Notes (Signed)
Date:  02/02/2021   ID:  [name], DOB May 18, 1956, MRN [id number]  PCP:  [name], MD  Cardiologist:  Rex Kras, DO, [facility]  (established care 02/02/2021)  REASON FOR CONSULT: Precordial pain  REQUESTING PHYSICIAN:  [name], [place] [place] [address],  [place] [address]  Chief Complaint  Patient presents with   Precordial pain   New Patient (Initial Visit)   Shortness of Breath    HPI  Rebecca Hahn is a 65 y.o. female who presents to the office with a chief complaint of " precordial pain." Patient's past medical history and cardiovascular risk factors include: Non-insulin-dependent diabetes mellitus type 2, Sjogren's syndrome, carpal tunnel syndrome, gout, obesity due to excess calories, Hx of DVT 2015, OSA on CPAP.   She is referred to the office at the request of [name], MD for evaluation of precordial pain.  Patient states that her chest pain started in March 2022 and it occurs intermittently approximately once or twice a month.  When she recently followed up with her PCP she was requested to be seen by cardiology for further evaluation and management.  She describes her chest pain and tightness like feeling, located substernally, intensity 7 out of 10, lasting for a few minutes, usually self-limited.  Her symptoms have not increased in frequency, intensity, or duration since March 2022.  At times with over exertional activities she has noticed chest discomfort which usually resolves with rest.  Patient states that the symptoms started after her spine surgery back in March 2022.  Review of systems are also positive for dyspnea on exertion.  She denies orthopnea, paroxysmal nocturnal dyspnea or lower extremity swelling.  No family history of premature coronary artery disease or sudden cardiac death.   FUNCTIONAL STATUS: No structured exercise program or daily routine.  ALLERGIES: Allergies  Allergen Reactions    Penicillins Rash    Has patient had a PCN reaction causing immediate rash, facial/tongue/throat swelling, SOB or lightheadedness with hypotension: No Has patient had a PCN reaction causing severe rash involving mucus membranes or skin necrosis: No Has patient had a PCN reaction that required hospitalization: No Has patient had a PCN reaction occurring within the last 10 years: No If all of the above answers are "NO", then may proceed with Cephalosporin use.      MEDICATION LIST PRIOR TO VISIT: Current Meds  Medication Sig   acetaminophen (TYLENOL) 500 MG tablet Take 1,000 mg by mouth in the morning and at bedtime.   allopurinol (ZYLOPRIM) 100 MG tablet Take 200 mg by mouth daily.    aspirin 81 MG EC tablet Take 1 tablet (81 mg total) by mouth daily.   clonazePAM (KLONOPIN) 0.5 MG tablet Take 1 tablet (0.5 mg total) by mouth 2 (two) times daily. (Patient taking differently: Take 1 mg by mouth every morning.)   fluticasone (FLONASE) 50 MCG/ACT nasal spray Place 1 spray into both nostrils daily as needed for allergies or rhinitis.   gabapentin (NEURONTIN) 100 MG capsule Take 100-200 mg by mouth See admin instructions. 100 mg in the morning, 200 mg at bedtime   leflunomide (ARAVA) 10 MG tablet Take 10 mg by mouth daily.   loratadine (CLARITIN) 10 MG tablet Take 10 mg by mouth daily.   meclizine (ANTIVERT) 12.5 MG tablet Take 12.5-25 mg by mouth See admin instructions. 12.5 mg in the morning, 25 mg at bedtime   metFORMIN (GLUCOPHAGE) 500 MG tablet Take 500 mg by mouth daily with breakfast.  Multiple Vitamins-Minerals (CENTRUM SILVER 50+WOMEN) TABS Take 1 tablet by mouth daily.   naproxen (NAPROSYN) 500 MG tablet Take 500 mg by mouth 2 (two) times daily with a meal.   omeprazole (PRILOSEC) 20 MG capsule Take 20 mg by mouth daily.   promethazine (PHENERGAN) 25 MG tablet Take 25 mg by mouth 2 (two) times daily as needed for nausea or vomiting.   rosuvastatin (CRESTOR) 20 MG tablet Take 20 mg by  mouth every evening.   sertraline (ZOLOFT) 100 MG tablet Take 1 tablet (100 mg total) by mouth at bedtime. (Patient taking differently: Take 200 mg by mouth at bedtime.)   SUMAtriptan (IMITREX) 100 MG tablet Take 100 mg by mouth every 2 (two) hours as needed for migraine. May repeat in 2 hours if headache persists or recurs.   topiramate (TOPAMAX) 25 MG tablet TAKE ONE-HALF TABLET BY MOUTH ONCE A DAY AND TAKE ONE AND ONE-HALF TABLETS AT BEDTIME FOR 4 WEEKS, THEN TAKE ONE-HALF TABLET ONCE A DAY AND TAKE TWO TABLETS AT BEDTIME TO REDUCE MIGRAINE FREQUENCY/SEVERITY   traZODone (DESYREL) 100 MG tablet Take 1 tablet (100 mg total) by mouth at bedtime.   [DISCONTINUED] metoprolol tartrate (LOPRESSOR) 50 MG tablet Take 1 tablet (50 mg total) by mouth 2 (two) times daily.     PAST MEDICAL HISTORY: Past Medical History:  Diagnosis Date   Agenesis of gallbladder    Alopecia    Anemia    Anxiety    Cognitive dysfunction    Colon polyp    DDD (degenerative disc disease), lumbar    Depression    Diabetes mellitus, type II (HCC)    Gastric polyp    GERD (gastroesophageal reflux disease)    Glaucoma    Gout    Headache    migraines   Hearing loss    Hyperlipidemia    Hypertension    Lumbar radiculopathy    Lumbar radiculopathy    Morbid obesity (HCC)    Osteoarthritis    Sjoegren syndrome    Sleep apnea    CPAP   Vertigo     PAST SURGICAL HISTORY: Past Surgical History:  Procedure Laterality Date   BACK SURGERY     CESAREAN SECTION     CHOLECYSTECTOMY     TUBAL LIGATION      FAMILY HISTORY: The patient family history includes Cancer in her cousin; Colon cancer in her maternal uncle; Depression in her maternal aunt; Gallstones in her mother; Lupus in her cousin.  SOCIAL HISTORY:  The patient  reports that she quit smoking about 44 years ago. Her smoking use included cigarettes. She has a 10.00 pack-year smoking history. She has never used smokeless tobacco. She reports that she  does not drink alcohol and does not use drugs.  REVIEW OF SYSTEMS: Review of Systems  Constitutional: Negative for chills and fever.  HENT:  Negative for hoarse voice and nosebleeds.   Eyes:  Negative for discharge, double vision and pain.  Cardiovascular:  Positive for chest pain and dyspnea on exertion. Negative for claudication, leg swelling, near-syncope, orthopnea, palpitations, paroxysmal nocturnal dyspnea and syncope.  Respiratory:  Positive for shortness of breath. Negative for hemoptysis.   Musculoskeletal:  Positive for muscle cramps. Negative for myalgias.  Gastrointestinal:  Negative for abdominal pain, constipation, diarrhea, hematemesis, hematochezia, melena, nausea and vomiting.  Neurological:  Negative for dizziness and light-headedness.   PHYSICAL EXAM: Vitals with BMI 02/02/2021 08/11/2020 08/11/2020  Height 5' 6"  - -  Weight 256 lbs 6 oz - -  BMI 51.7 - -  Systolic 616 99 073  Diastolic 86 54 47  Pulse 97 102 77  Some encounter information is confidential and restricted. Go to Review Flowsheets activity to see all data.    CONSTITUTIONAL: Well-developed and well-nourished. No acute distress.  SKIN: Skin is warm and dry. No rash noted. No cyanosis. No pallor. No jaundice HEAD: Normocephalic and atraumatic.  EYES: No scleral icterus MOUTH/THROAT: Moist oral membranes.  NECK: No JVD present. No thyromegaly noted. No carotid bruits  LYMPHATIC: No visible cervical adenopathy.  CHEST Normal respiratory effort. No intercostal retractions  LUNGS: Clear to auscultation bilaterally. No stridor. No wheezes. No rales.  CARDIOVASCULAR: Regular rate and rhythm, positive S1-S2, no murmurs rubs or gallops appreciated. ABDOMINAL: Obese, soft, nontender, nondistended, positive bowel sounds all 4 quadrants. No apparent ascites.  EXTREMITIES: No peripheral edema, +2 DP bilateral and PT are faint.  HEMATOLOGIC: No significant bruising NEUROLOGIC: Oriented to person, place, and time.  Nonfocal. Normal muscle tone.  PSYCHIATRIC: Normal mood and affect. Normal behavior. Cooperative  CARDIAC DATABASE: EKG: 02/02/2021: Normal sinus rhythm, 99 bpm, without underlying ischemia or injury pattern.  Echocardiogram: 01/20/2021 performed at [facility] (per report): LVEF 57%   Stress Testing: No results found for this or any previous visit from the past 1095 days.  Heart Catheterization: None  LABORATORY DATA: CBC Latest Ref Rng & Units 08/11/2020 08/06/2020 09/05/2019  WBC 4.0 - 10.5 K/uL 14.3(H) 8.2 -  Hemoglobin 12.0 - 15.0 g/dL 10.1(L) 11.6(L) 12.9  Hematocrit 36.0 - 46.0 % 31.9(L) 37.2 38.0  Platelets 150 - 400 K/uL 268 313 -    CMP Latest Ref Rng & Units 08/11/2020 08/06/2020 09/05/2019  Glucose 70 - 99 mg/dL 149(H) 92 81  BUN 8 - 23 mg/dL 13 12 15   Creatinine 0.44 - 1.00 mg/dL 0.79 0.77 0.80  Sodium 135 - 145 mmol/L 142 139 140  Potassium 3.5 - 5.1 mmol/L 4.4 4.3 4.0  Chloride 98 - 111 mmol/L 105 105 102  CO2 22 - 32 mmol/L 31 28 -  Calcium 8.9 - 10.3 mg/dL 9.6 9.2 -  Total Protein 6.5 - 8.1 g/dL - - -  Total Bilirubin 0.3 - 1.2 mg/dL - - -  Alkaline Phos 38 - 126 U/L - - -  AST 15 - 41 U/L - - -  ALT 14 - 54 U/L - - -    Lipid Panel  No results found for: CHOL, TRIG, HDL, CHOLHDL, VLDL, LDLCALC, LDLDIRECT, LABVLDL  No components found for: NTPROBNP No results for input(s): PROBNP in the last 8760 hours. No results for input(s): TSH in the last 8760 hours.  BMP Recent Labs    08/06/20 1000 08/11/20 0442  NA 139 142  K 4.3 4.4  CL 105 105  CO2 28 31  GLUCOSE 92 149*  BUN 12 13  CREATININE 0.77 0.79  CALCIUM 9.2 9.6  GFRNONAA >60 >60    HEMOGLOBIN A1C No results found for: HGBA1C, MPG  External Labs:  Date Collected: 12/16/2020 , information obtained by referring provider Potassium: 4.3 Creatinine 0.88 mg/dL. eGFR: 79 mL/min per 1.73 m Hemoglobin: 11.7 g/dL and hematocrit: 37.3 %  01/10/2021: Hemoglobin A1c 7.1. Total  cholesterol 145, HDL 52, LDL 78, non-HDL 93, triglycerides 77. AST 19, ALT 23, alkaline phosphatase 80. Sodium 145.  Potassium 4.9, chloride 107, bicarb 26, BUN 15, creatinine 0.86  IMPRESSION:    ICD-10-CM   1. Precordial pain  R07.2 EKG 12-Lead    CT CORONARY [facility] W/CTA  COR W/SCORE W/CA W/CM &/OR WO/CM    Basic metabolic panel    B Nat Peptide    DISCONTINUED: metoprolol tartrate (LOPRESSOR) 50 MG tablet    2. Dyspnea on exertion  O75.64 Basic metabolic panel    B Nat Peptide    3. Type 2 diabetes mellitus with hyperglycemia, without long-term current use of insulin (HCC)  E11.65     4. Mixed hyperlipidemia  E78.2     5. Sjogren's syndrome with keratoconjunctivitis sicca (HCC)  M35.01     6. Obstructive sleep apnea syndrome  G47.33        RECOMMENDATIONS: Lynessa Almanzar Attridge is a 65 y.o. female whose past medical history and cardiac risk factors include: Non-insulin-dependent diabetes mellitus type 2, Sjogren's syndrome, carpal tunnel syndrome, gout, obesity due to excess calories, Hx of DVT 2015, OSA on CPAP.   Precordial pain Symptoms concerning for possible cardiac etiology. Relatively stable since March 2022. EKG nonischemic. No active discomfort at the time of the evaluation. Echocardiogram performed at outside facility independently reviewed, per report LVEF overall preserved.  Did not comment on regional wall motion abnormalities. Shared decision is to proceed with ischemic evaluation to evaluate for obstructive CAD. Start Lopressor 50 mg p.o. twice daily Patient will be scheduled for coronary CTA to evaluate her coronary calcium score nonobstructive CAD. Check BMP. Educated on seeking medical attention sooner by going to the closest ER via EMS if the symptoms increase in intensity, frequency, duration, or has typical chest pain as discussed in the office.  Patient verbalized understanding.  Dyspnea on exertion Multifactorial. Appears to be euvolemic on physical  examination. Check BNP Echocardiogram from outside facility results reviewed and summarized above. Patient's blood pressure at today's office visit elevated/within acceptable range.  However, I suspect that she may have stage I/II hypertension.  I have asked her to keep a log of her blood pressures and to either review it with myself or her PCP during her next office encounter.  Type 2 diabetes mellitus with hyperglycemia, without long-term current use of insulin (HCC) Educated in the importance of glycemic control. Currently on statin therapy. Patient would benefit from ARB for renal protection I have asked her to keep a log of her blood pressures and to either review it with myself or her PCP.  Mixed hyperlipidemia Most recent LDL 78 mg/dL. Recommend a goal LDL of less than 70 mg/dL in the setting of diabetes. Currently on statin therapy.  Obstructive sleep apnea syndrome Patient is encouraged to comply with her daily use of his CPAP.  Thank you for allowing [place] to participate in the care of [place] please reach out if any questions or concerns arise.  FINAL MEDICATION LIST END OF ENCOUNTER: Meds ordered this encounter  Medications   DISCONTD: metoprolol tartrate (LOPRESSOR) 50 MG tablet    Sig: Take 1 tablet (50 mg total) by mouth 2 (two) times daily.    Dispense:  60 tablet    Refill:  0      Current Outpatient Medications:    acetaminophen (TYLENOL) 500 MG tablet, Take 1,000 mg by mouth in the morning and at bedtime., Disp: , Rfl:    allopurinol (ZYLOPRIM) 100 MG tablet, Take 200 mg by mouth daily. , Disp: , Rfl:    aspirin 81 MG EC tablet, Take 1 tablet (81 mg total) by mouth daily., Disp: 30 tablet, Rfl: 12   clonazePAM (KLONOPIN) 0.5 MG tablet, Take 1 tablet (0.5 mg total) by mouth 2 (two) times  daily. (Patient taking differently: Take 1 mg by mouth every morning.), Disp: 180 tablet, Rfl: 1   fluticasone (FLONASE) 50 MCG/ACT nasal spray, Place 1 spray into both  nostrils daily as needed for allergies or rhinitis., Disp: , Rfl:    gabapentin (NEURONTIN) 100 MG capsule, Take 100-200 mg by mouth See admin instructions. 100 mg in the morning, 200 mg at bedtime, Disp: , Rfl:    leflunomide (ARAVA) 10 MG tablet, Take 10 mg by mouth daily., Disp: , Rfl:    loratadine (CLARITIN) 10 MG tablet, Take 10 mg by mouth daily., Disp: , Rfl:    meclizine (ANTIVERT) 12.5 MG tablet, Take 12.5-25 mg by mouth See admin instructions. 12.5 mg in the morning, 25 mg at bedtime, Disp: , Rfl:    metFORMIN (GLUCOPHAGE) 500 MG tablet, Take 500 mg by mouth daily with breakfast., Disp: , Rfl:    Multiple Vitamins-Minerals (CENTRUM SILVER 50+WOMEN) TABS, Take 1 tablet by mouth daily., Disp: , Rfl:    naproxen (NAPROSYN) 500 MG tablet, Take 500 mg by mouth 2 (two) times daily with a meal., Disp: , Rfl:    omeprazole (PRILOSEC) 20 MG capsule, Take 20 mg by mouth daily., Disp: , Rfl:    promethazine (PHENERGAN) 25 MG tablet, Take 25 mg by mouth 2 (two) times daily as needed for nausea or vomiting., Disp: , Rfl:    rosuvastatin (CRESTOR) 20 MG tablet, Take 20 mg by mouth every evening., Disp: , Rfl:    sertraline (ZOLOFT) 100 MG tablet, Take 1 tablet (100 mg total) by mouth at bedtime. (Patient taking differently: Take 200 mg by mouth at bedtime.), Disp: 90 tablet, Rfl: 1   SUMAtriptan (IMITREX) 100 MG tablet, Take 100 mg by mouth every 2 (two) hours as needed for migraine. May repeat in 2 hours if headache persists or recurs., Disp: , Rfl:    topiramate (TOPAMAX) 25 MG tablet, TAKE ONE-HALF TABLET BY MOUTH ONCE A DAY AND TAKE ONE AND ONE-HALF TABLETS AT BEDTIME FOR 4 WEEKS, THEN TAKE ONE-HALF TABLET ONCE A DAY AND TAKE TWO TABLETS AT BEDTIME TO REDUCE MIGRAINE FREQUENCY/SEVERITY, Disp: , Rfl:    traZODone (DESYREL) 100 MG tablet, Take 1 tablet (100 mg total) by mouth at bedtime., Disp: 90 tablet, Rfl: 1   albuterol (PROVENTIL HFA;VENTOLIN HFA) 108 (90 Base) MCG/ACT inhaler, Inhale 1-2 puffs  into the lungs every 6 (six) hours as needed for wheezing or shortness of breath., Disp: 1 Inhaler, Rfl: 0   Biotin w/ Vitamins C & E (HAIR SKIN & NAILS GUMMIES PO), Take 2 each by mouth daily., Disp: , Rfl:    Carboxymethylcellulose Sodium 1 % GEL, Place 1 drop into both eyes 4 (four) times daily as needed (dry eyes)., Disp: , Rfl:    metoprolol tartrate (LOPRESSOR) 50 MG tablet, TAKE 1 TABLET(50 MG) BY MOUTH TWICE DAILY, Disp: 180 tablet, Rfl: 1  Orders Placed This Encounter  Procedures   CT CORONARY MORPH W/CTA COR W/SCORE W/CA W/CM &/OR WO/CM   Basic metabolic panel   B Nat Peptide   EKG 12-Lead    There are no Patient Instructions on file for this visit.   --Continue cardiac medications as reconciled in final medication list. --Return in about 4 weeks (around 03/02/2021) for Follow up, Chest pain, CCTA. Or sooner if needed. --Continue follow-up with your primary care physician regarding the management of your other chronic comorbid conditions.  Patient's questions and concerns were addressed to her satisfaction. She voices understanding of the instructions provided  during this encounter.   This note was created using a voice recognition software as a result there may be grammatical errors inadvertently enclosed that do not reflect the nature of this encounter. Every attempt is made to correct such errors.  Rex Kras, [place], [facility]  Pager: [phone] Office: [phone]

## 2021-02-02 ENCOUNTER — Other Ambulatory Visit: Payer: Self-pay | Admitting: Cardiology

## 2021-02-02 ENCOUNTER — Encounter: Payer: Self-pay | Admitting: Cardiology

## 2021-02-02 ENCOUNTER — Other Ambulatory Visit: Payer: Self-pay

## 2021-02-02 ENCOUNTER — Ambulatory Visit: Payer: Medicare Other | Admitting: Cardiology

## 2021-02-02 VITALS — BP 136/86 | HR 97 | Temp 98.4°F | Resp 16 | Ht 66.0 in | Wt 256.4 lb

## 2021-02-02 DIAGNOSIS — R072 Precordial pain: Secondary | ICD-10-CM

## 2021-02-02 DIAGNOSIS — E1165 Type 2 diabetes mellitus with hyperglycemia: Secondary | ICD-10-CM

## 2021-02-02 DIAGNOSIS — E782 Mixed hyperlipidemia: Secondary | ICD-10-CM | POA: Diagnosis not present

## 2021-02-02 DIAGNOSIS — G4733 Obstructive sleep apnea (adult) (pediatric): Secondary | ICD-10-CM

## 2021-02-02 DIAGNOSIS — R0600 Dyspnea, unspecified: Secondary | ICD-10-CM

## 2021-02-02 DIAGNOSIS — M3501 Sicca syndrome with keratoconjunctivitis: Secondary | ICD-10-CM

## 2021-02-02 DIAGNOSIS — R0609 Other forms of dyspnea: Secondary | ICD-10-CM | POA: Diagnosis not present

## 2021-02-02 MED ORDER — METOPROLOL TARTRATE 50 MG PO TABS: 50.0000 mg | ORAL_TABLET | Freq: Two times a day (BID) | ORAL | 0 refills | Status: DC

## 2021-02-11 ENCOUNTER — Telehealth (HOSPITAL_COMMUNITY): Payer: Self-pay | Admitting: Emergency Medicine

## 2021-02-11 NOTE — Telephone Encounter (Signed)
Attempted to call patient regarding upcoming cardiac CT appointment. Left message on voicemail with name and callback number Rockwell Alexandria RN Navigator Cardiac Imaging [facility] [phone] Office [phone] Cell  Courtesy call to remind we need labs for CCTA

## 2021-02-14 ENCOUNTER — Telehealth (HOSPITAL_COMMUNITY): Payer: Self-pay | Admitting: Emergency Medicine

## 2021-02-14 NOTE — Telephone Encounter (Signed)
Reaching out to patient to offer assistance regarding upcoming cardiac imaging study; pt verbalizes understanding of appt date/time, parking situation and where to check in, pre-test NPO status and medications ordered, and verified current allergies; name and call back number provided for further questions should they arise Rockwell Alexandria RN Navigator Cardiac Imaging [facility] [phone] office [phone] cell    50mg  metoprolol BID new rx 9/15 Denies IV issues Denies claustro Holding allergy meds

## 2021-02-15 LAB — BASIC METABOLIC PANEL
BUN/Creatinine Ratio: 17 (ref 12–28)
BUN: 15 mg/dL (ref 8–27)
CO2: 23 mmol/L (ref 20–29)
Calcium: 9.6 mg/dL (ref 8.7–10.3)
Chloride: 105 mmol/L (ref 96–106)
Creatinine, Ser: 0.87 mg/dL (ref 0.57–1.00)
Glucose: 113 mg/dL — ABNORMAL HIGH (ref 70–99)
Potassium: 4.9 mmol/L (ref 3.5–5.2)
Sodium: 141 mmol/L (ref 134–144)
eGFR: 73 mL/min/{1.73_m2} (ref >59)

## 2021-02-15 LAB — BRAIN NATRIURETIC PEPTIDE: BNP: 100.6 pg/mL — ABNORMAL HIGH (ref 0.0–100.0)

## 2021-02-16 ENCOUNTER — Other Ambulatory Visit: Payer: Self-pay

## 2021-02-16 ENCOUNTER — Encounter: Payer: Medicare Other | Admitting: *Deleted

## 2021-02-16 ENCOUNTER — Ambulatory Visit (HOSPITAL_COMMUNITY)
Admission: RE | Admit: 2021-02-16 | Discharge: 2021-02-16 | Disposition: A | Discharge status: Home / Self Care | Payer: Medicare Other | Source: Ambulatory Visit | Attending: Internal Medicine | Admitting: Internal Medicine

## 2021-02-16 DIAGNOSIS — R072 Precordial pain: Secondary | ICD-10-CM | POA: Diagnosis not present

## 2021-02-16 DIAGNOSIS — Z006 Encounter for examination for normal comparison and control in clinical research program: Secondary | ICD-10-CM

## 2021-02-16 MED ORDER — IOHEXOL 350 MG/ML SOLN
100.0000 mL | Freq: Once | INTRAVENOUS | Status: AC | PRN
  Administered 2021-02-16: 95 mL via INTRAVENOUS

## 2021-02-16 MED ORDER — METOPROLOL TARTRATE 5 MG/5ML IV SOLN
INTRAVENOUS | Status: AC
  Filled 2021-02-16: qty 10

## 2021-02-16 MED ORDER — NITROGLYCERIN 0.4 MG SL SUBL
0.8000 mg | SUBLINGUAL_TABLET | Freq: Once | SUBLINGUAL | Status: AC
  Administered 2021-02-16: 0.8 mg via SUBLINGUAL

## 2021-02-16 MED ORDER — METOPROLOL TARTRATE 5 MG/5ML IV SOLN: 5.0000 mg | INTRAVENOUS | Status: DC | PRN

## 2021-02-16 MED ORDER — NITROGLYCERIN 0.4 MG SL SUBL
SUBLINGUAL_TABLET | SUBLINGUAL | Status: AC
  Filled 2021-02-16: qty 2

## 2021-02-16 NOTE — Research (Signed)
IDENTIFY Informed Consent                  Subject Name:  Rebecca Hahn   Subject met inclusion and exclusion criteria.  The informed consent form, study requirements and expectations were reviewed with the subject and questions and concerns were addressed prior to the signing of the consent form.  The subject verbalized understanding of the trial requirements.  The subject agreed to participate in the IDENTIFY trial and signed the informed consent at 02-16-2021 at 09:19 a.m.Marland Kitchen  The informed consent was obtained prior to performance of any protocol-specific procedures for the subject.  A copy of the signed informed consent was given to the subject and a copy was placed in the subject's medical record.   [place] Lauralie Blacksher, Research Assistant 02/16/2021  09:19 a.m.

## 2021-02-20 DIAGNOSIS — R072 Precordial pain: Secondary | ICD-10-CM | POA: Insufficient documentation

## 2021-03-07 ENCOUNTER — Ambulatory Visit: Payer: Medicare Other | Admitting: Cardiology

## 2021-03-09 DIAGNOSIS — G5601 Carpal tunnel syndrome, right upper limb: Secondary | ICD-10-CM | POA: Diagnosis not present

## 2021-03-09 DIAGNOSIS — Z6841 Body Mass Index (BMI) 40.0 and over, adult: Secondary | ICD-10-CM | POA: Diagnosis not present

## 2021-03-10 ENCOUNTER — Other Ambulatory Visit: Payer: Self-pay

## 2021-03-10 ENCOUNTER — Ambulatory Visit: Payer: Medicare Other | Admitting: Cardiology

## 2021-03-10 ENCOUNTER — Encounter: Payer: Self-pay | Admitting: Cardiology

## 2021-03-10 VITALS — BP 129/75 | HR 77 | Temp 97.0°F | Resp 16 | Ht 66.0 in | Wt 258.6 lb

## 2021-03-10 DIAGNOSIS — G4733 Obstructive sleep apnea (adult) (pediatric): Secondary | ICD-10-CM

## 2021-03-10 DIAGNOSIS — E1165 Type 2 diabetes mellitus with hyperglycemia: Secondary | ICD-10-CM

## 2021-03-10 DIAGNOSIS — R0609 Other forms of dyspnea: Secondary | ICD-10-CM

## 2021-03-10 DIAGNOSIS — E782 Mixed hyperlipidemia: Secondary | ICD-10-CM

## 2021-03-10 DIAGNOSIS — R072 Precordial pain: Secondary | ICD-10-CM | POA: Diagnosis not present

## 2021-03-10 NOTE — Progress Notes (Signed)
Date:  03/10/2021   ID:  [place], DOB 15-Sep-1955, MRN [id number]  PCP:  [name], MD  Cardiologist:  Rex Kras, DO, [place]  (established care 02/02/2021)  Date: 03/10/21 Last Office Visit: 02/02/2021  Chief Complaint  Patient presents with   Precordial pain   Results   Follow-up    HPI  Rebecca Hahn is a 65 y.o. female who presents to the office with a chief complaint of " reevaluation of chest pain and discuss test results." Patient's past medical history and cardiovascular risk factors include: Non-insulin-dependent diabetes mellitus type 2, Sjogren's syndrome, carpal tunnel syndrome, gout, obesity due to excess calories, Hx of DVT 2015, OSA on CPAP.   She is referred to the office at the request of [name], MD for evaluation of precordial pain.  Initially was referred to cardiology for evaluation of chest pain.  Her symptoms were concerning for possible cardiac etiology and therefore was recommended to proceed with additional testing.  She had an echocardiogram at an outside facility which noted preserved LVEF and did not comment on regional wall motion abnormalities.  She had a coronary CTA results reviewed with her in great detail at today's office visit and noted below for further reference.  Clinically patient has not had any reoccurrence of chest pain since last office encounter.  This was most likely contributed to her underlying stress factors per patient.  Patient is also kept a log of her blood pressures and the systolic blood pressure ranges predominantly between 120-130 mmHg.  Her dyspnea with exertion has also improved.  Her last BNP was 100.6 (upper limit of normal is 100).   No family history of premature coronary artery disease or sudden cardiac death.   FUNCTIONAL STATUS: No structured exercise program or daily routine.  ALLERGIES: Allergies  Allergen Reactions   Oxycodone     Loopy, confusion, panic, paranoia    Penicillins  Rash    Has patient had a PCN reaction causing immediate rash, facial/tongue/throat swelling, SOB or lightheadedness with hypotension: No Has patient had a PCN reaction causing severe rash involving mucus membranes or skin necrosis: No Has patient had a PCN reaction that required hospitalization: No Has patient had a PCN reaction occurring within the last 10 years: No If all of the above answers are "NO", then may proceed with Cephalosporin use.      MEDICATION LIST PRIOR TO VISIT: Current Meds  Medication Sig   acetaminophen (TYLENOL) 500 MG tablet Take 1,000 mg by mouth in the morning and at bedtime.   albuterol (PROVENTIL HFA;VENTOLIN HFA) 108 (90 Base) MCG/ACT inhaler Inhale 1-2 puffs into the lungs every 6 (six) hours as needed for wheezing or shortness of breath.   allopurinol (ZYLOPRIM) 100 MG tablet Take 200 mg by mouth daily.    aspirin 81 MG EC tablet Take 1 tablet (81 mg total) by mouth daily.   Carboxymethylcellulose Sodium 1 % GEL Place 1 drop into both eyes 4 (four) times daily as needed (dry eyes).   clonazePAM (KLONOPIN) 0.5 MG tablet Take 1 tablet (0.5 mg total) by mouth 2 (two) times daily. (Patient taking differently: Take 1 mg by mouth every morning.)   fluticasone (FLONASE) 50 MCG/ACT nasal spray Place 1 spray into both nostrils daily as needed for allergies or rhinitis.   gabapentin (NEURONTIN) 100 MG capsule Take 100-200 mg by mouth See admin instructions. 100 mg in the morning, 200 mg at bedtime   leflunomide (ARAVA) 10 MG tablet Take 10  mg by mouth daily.   loratadine (CLARITIN) 10 MG tablet Take 10 mg by mouth daily.   meclizine (ANTIVERT) 12.5 MG tablet Take 12.5-25 mg by mouth See admin instructions. 12.5 mg in the morning, 25 mg at bedtime   metFORMIN (GLUCOPHAGE) 500 MG tablet Take 500 mg by mouth daily with breakfast.   metoprolol tartrate (LOPRESSOR) 50 MG tablet TAKE 1 TABLET(50 MG) BY MOUTH TWICE DAILY   Multiple Vitamins-Minerals (CENTRUM SILVER 50+WOMEN)  TABS Take 1 tablet by mouth daily.   omeprazole (PRILOSEC) 20 MG capsule Take 20 mg by mouth daily.   promethazine (PHENERGAN) 25 MG tablet Take 25 mg by mouth 2 (two) times daily as needed for nausea or vomiting.   rosuvastatin (CRESTOR) 20 MG tablet Take 20 mg by mouth every evening.   sertraline (ZOLOFT) 100 MG tablet Take 1 tablet (100 mg total) by mouth at bedtime. (Patient taking differently: Take 200 mg by mouth at bedtime.)   SUMAtriptan (IMITREX) 100 MG tablet Take 100 mg by mouth every 2 (two) hours as needed for migraine. May repeat in 2 hours if headache persists or recurs.   topiramate (TOPAMAX) 25 MG tablet TAKE ONE-HALF TABLET BY MOUTH ONCE A DAY AND TAKE ONE AND ONE-HALF TABLETS AT BEDTIME FOR 4 WEEKS, THEN TAKE ONE-HALF TABLET ONCE A DAY AND TAKE TWO TABLETS AT BEDTIME TO REDUCE MIGRAINE FREQUENCY/SEVERITY   traZODone (DESYREL) 100 MG tablet Take 1 tablet (100 mg total) by mouth at bedtime.     PAST MEDICAL HISTORY: Past Medical History:  Diagnosis Date   Agenesis of gallbladder    Alopecia    Anemia    Anxiety    Cognitive dysfunction    Colon polyp    DDD (degenerative disc disease), lumbar    Depression    Diabetes mellitus, type II (HCC)    Gastric polyp    GERD (gastroesophageal reflux disease)    Glaucoma    Gout    Headache    migraines   Hearing loss    Hyperlipidemia    Hypertension    Lumbar radiculopathy    Lumbar radiculopathy    Morbid obesity (HCC)    Osteoarthritis    Sjoegren syndrome    Sleep apnea    CPAP   Vertigo     PAST SURGICAL HISTORY: Past Surgical History:  Procedure Laterality Date   BACK SURGERY     CESAREAN SECTION     CHOLECYSTECTOMY     TUBAL LIGATION      FAMILY HISTORY: The patient family history includes Cancer in her cousin; Colon cancer in her maternal uncle; Depression in her maternal aunt; Gallstones in her mother; Lupus in her cousin.  SOCIAL HISTORY:  The patient  reports that she quit smoking about 44  years ago. Her smoking use included cigarettes. She has a 10.00 pack-year smoking history. She has never used smokeless tobacco. She reports that she does not drink alcohol and does not use drugs.  REVIEW OF SYSTEMS: Review of Systems  Constitutional: Negative for chills and fever.  HENT:  Negative for hoarse voice and nosebleeds.   Eyes:  Negative for discharge, double vision and pain.  Cardiovascular:  Positive for dyspnea on exertion (improved). Negative for chest pain, claudication, leg swelling, near-syncope, orthopnea, palpitations, paroxysmal nocturnal dyspnea and syncope.  Respiratory:  Negative for hemoptysis and shortness of breath.   Musculoskeletal:  Negative for muscle cramps and myalgias.  Gastrointestinal:  Negative for abdominal pain, constipation, diarrhea, hematemesis, hematochezia, melena, nausea and  vomiting.  Neurological:  Negative for dizziness and light-headedness.   PHYSICAL EXAM: Vitals with BMI 03/10/2021 02/16/2021 02/16/2021  Height $Remov'5\' 6"'KBeocd$  - -  Weight 258 lbs 10 oz - -  BMI 33.43 - -  Systolic 568 616 837  Diastolic 75 90 83  Pulse 77 73 74  Some encounter information is confidential and restricted. Go to Review Flowsheets activity to see all data.    CONSTITUTIONAL: Well-developed and well-nourished. No acute distress.  SKIN: Skin is warm and dry. No rash noted. No cyanosis. No pallor. No jaundice HEAD: Normocephalic and atraumatic.  EYES: No scleral icterus MOUTH/THROAT: Moist oral membranes.  NECK: No JVD present. No thyromegaly noted. No carotid bruits  LYMPHATIC: No visible cervical adenopathy.  CHEST Normal respiratory effort. No intercostal retractions  LUNGS: Clear to auscultation bilaterally. No stridor. No wheezes. No rales.  CARDIOVASCULAR: Regular rate and rhythm, positive S1-S2, no murmurs rubs or gallops appreciated. ABDOMINAL: Obese, soft, nontender, nondistended, positive bowel sounds all 4 quadrants. No apparent ascites.  EXTREMITIES: No  peripheral edema, +2 DP bilateral and PT are faint.  HEMATOLOGIC: No significant bruising NEUROLOGIC: Oriented to person, place, and time. Nonfocal. Normal muscle tone.  PSYCHIATRIC: Normal mood and affect. Normal behavior. Cooperative  CARDIAC DATABASE: EKG: 02/02/2021: Normal sinus rhythm, 99 bpm, without underlying ischemia or injury pattern.  Echocardiogram: 01/20/2021 performed at [facility] (per report): LVEF 57%   Stress Testing: No results found for this or any previous visit from the past 1095 days.  Heart Catheterization: None  CCTA 02/16/2021: 1. Total coronary calcium score of 0. 2. Normal coronary origin with right dominance. 3. CAD-RADS = 0 No evidence of CAD (0%). 4. Hepatic steatosis  RECOMMENDATIONS: Consider non-atherosclerotic causes of chest pain. Of note, overall accuracy may be limited due to artifact. If anginal discomfort continues despite uptitration of medications may consider another functional assessment for CAD. Clinical correlation required.  LABORATORY DATA: CBC Latest Ref Rng & Units 08/11/2020 08/06/2020 09/05/2019  WBC 4.0 - 10.5 K/uL 14.3(H) 8.2 -  Hemoglobin 12.0 - 15.0 g/dL 10.1(L) 11.6(L) 12.9  Hematocrit 36.0 - 46.0 % 31.9(L) 37.2 38.0  Platelets 150 - 400 K/uL 268 313 -    CMP Latest Ref Rng & Units 02/14/2021 08/11/2020 08/06/2020  Glucose 70 - 99 mg/dL 113(H) 149(H) 92  BUN 8 - 27 mg/dL $Remove'15 13 12  'hAycIUe$ Creatinine 0.57 - 1.00 mg/dL 0.87 0.79 0.77  Sodium 134 - 144 mmol/L 141 142 139  Potassium 3.5 - 5.2 mmol/L 4.9 4.4 4.3  Chloride 96 - 106 mmol/L 105 105 105  CO2 20 - 29 mmol/L $RemoveB'23 31 28  'KxhVkkNw$ Calcium 8.7 - 10.3 mg/dL 9.6 9.6 9.2  Total Protein 6.5 - 8.1 g/dL - - -  Total Bilirubin 0.3 - 1.2 mg/dL - - -  Alkaline Phos 38 - 126 U/L - - -  AST 15 - 41 U/L - - -  ALT 14 - 54 U/L - - -    Lipid Panel  No results found for: CHOL, TRIG, HDL, CHOLHDL, VLDL, LDLCALC, LDLDIRECT, LABVLDL  No components found for: NTPROBNP No  results for input(s): PROBNP in the last 8760 hours. No results for input(s): TSH in the last 8760 hours.  BMP Recent Labs    08/06/20 1000 08/11/20 0442 02/14/21 1006  NA 139 142 141  K 4.3 4.4 4.9  CL 105 105 105  CO2 $Re'28 31 23  'Cms$ GLUCOSE 92 149* 113*  BUN $Re'12 13 15  'VsR$ CREATININE 0.77 0.79 0.87  CALCIUM 9.2 9.6 9.6  GFRNONAA >60 >60  --     HEMOGLOBIN A1C No results found for: HGBA1C, MPG  External Labs:  Date Collected: 12/16/2020 , information obtained by referring provider Potassium: 4.3 Creatinine 0.88 mg/dL. eGFR: 79 mL/min per 1.73 m Hemoglobin: 11.7 g/dL and hematocrit: 37.3 %  01/10/2021: Hemoglobin A1c 7.1. Total cholesterol 145, HDL 52, LDL 78, non-HDL 93, triglycerides 77. AST 19, ALT 23, alkaline phosphatase 80. Sodium 145.  Potassium 4.9, chloride 107, bicarb 26, BUN 15, creatinine 0.86  IMPRESSION:    ICD-10-CM   1. Precordial pain  R07.2     2. Dyspnea on exertion  R06.09     3. Type 2 diabetes mellitus with hyperglycemia, without long-term current use of insulin (HCC)  E11.65     4. Mixed hyperlipidemia  E78.2     5. Obstructive sleep apnea syndrome  G47.33         RECOMMENDATIONS: Sherhonda Gaspar Brooks is a 65 y.o. female whose past medical history and cardiac risk factors include: Non-insulin-dependent diabetes mellitus type 2, Sjogren's syndrome, carpal tunnel syndrome, gout, obesity due to excess calories, Hx of DVT 2015, OSA on CPAP.   Precordial pain No longer present since last office visit. EKG nonischemic. Outside echo results noted preserved LVEF without mention of regional wall motion abnormalities. Coronary CTA results reviewed with her in great detail: Total coronary calcium score 0, no evidence of CAD. Educated on the importance of primary prevention with improving her modifiable cardiovascular risk factors. Patient is currently on Lopressor which is given to her prior to her coronary CTA.  We will have her continue Lopressor for now  until she follows up with her PCP and consider transitioning her from Lopressor to losartan/lisinopril given her underlying diabetes.  Dyspnea on exertion Multifactorial. Appears to be euvolemic on physical examination. BNP relatively within normal limits. Ischemic work-up reviewed during this office visit. Patient is asked to keep a log of her blood pressures and to review it with PCP.  Type 2 diabetes mellitus with hyperglycemia, without long-term current use of insulin (HCC) Educated in the importance of glycemic control. Currently on statin therapy. Patient would benefit from ARB/ACE inhibitors for renal protection I have asked her to keep a log of her blood pressures and to either review it with myself or her PCP.  Mixed hyperlipidemia Most recent LDL 78 mg/dL. Recommend a goal LDL of less than 70 mg/dL in the setting of diabetes. Currently on statin therapy.  Obstructive sleep apnea syndrome Patient is encouraged to comply with her daily use of his CPAP.  Ms. Dorianne is doing well from a cardiovascular standpoint.  No additional cardiovascular testing needed at this time.  Educated on the importance of primary prevention.  The shared decision was to follow-up on an annual basis after her yearly physical to review any new cardiac symptoms or questions that she may have and laboratory work-up.  She is more than welcome to follow-up sooner if change in clinical status.  She is thankful for the care and the attention provided.  FINAL MEDICATION LIST END OF ENCOUNTER: No orders of the defined types were placed in this encounter.     Current Outpatient Medications:    acetaminophen (TYLENOL) 500 MG tablet, Take 1,000 mg by mouth in the morning and at bedtime., Disp: , Rfl:    albuterol (PROVENTIL HFA;VENTOLIN HFA) 108 (90 Base) MCG/ACT inhaler, Inhale 1-2 puffs into the lungs every 6 (six) hours as needed for wheezing or shortness  of breath., Disp: 1 Inhaler, Rfl: 0   allopurinol  (ZYLOPRIM) 100 MG tablet, Take 200 mg by mouth daily. , Disp: , Rfl:    aspirin 81 MG EC tablet, Take 1 tablet (81 mg total) by mouth daily., Disp: 30 tablet, Rfl: 12   Carboxymethylcellulose Sodium 1 % GEL, Place 1 drop into both eyes 4 (four) times daily as needed (dry eyes)., Disp: , Rfl:    clonazePAM (KLONOPIN) 0.5 MG tablet, Take 1 tablet (0.5 mg total) by mouth 2 (two) times daily. (Patient taking differently: Take 1 mg by mouth every morning.), Disp: 180 tablet, Rfl: 1   fluticasone (FLONASE) 50 MCG/ACT nasal spray, Place 1 spray into both nostrils daily as needed for allergies or rhinitis., Disp: , Rfl:    gabapentin (NEURONTIN) 100 MG capsule, Take 100-200 mg by mouth See admin instructions. 100 mg in the morning, 200 mg at bedtime, Disp: , Rfl:    leflunomide (ARAVA) 10 MG tablet, Take 10 mg by mouth daily., Disp: , Rfl:    loratadine (CLARITIN) 10 MG tablet, Take 10 mg by mouth daily., Disp: , Rfl:    meclizine (ANTIVERT) 12.5 MG tablet, Take 12.5-25 mg by mouth See admin instructions. 12.5 mg in the morning, 25 mg at bedtime, Disp: , Rfl:    metFORMIN (GLUCOPHAGE) 500 MG tablet, Take 500 mg by mouth daily with breakfast., Disp: , Rfl:    metoprolol tartrate (LOPRESSOR) 50 MG tablet, TAKE 1 TABLET(50 MG) BY MOUTH TWICE DAILY, Disp: 180 tablet, Rfl: 1   Multiple Vitamins-Minerals (CENTRUM SILVER 50+WOMEN) TABS, Take 1 tablet by mouth daily., Disp: , Rfl:    omeprazole (PRILOSEC) 20 MG capsule, Take 20 mg by mouth daily., Disp: , Rfl:    promethazine (PHENERGAN) 25 MG tablet, Take 25 mg by mouth 2 (two) times daily as needed for nausea or vomiting., Disp: , Rfl:    rosuvastatin (CRESTOR) 20 MG tablet, Take 20 mg by mouth every evening., Disp: , Rfl:    sertraline (ZOLOFT) 100 MG tablet, Take 1 tablet (100 mg total) by mouth at bedtime. (Patient taking differently: Take 200 mg by mouth at bedtime.), Disp: 90 tablet, Rfl: 1   SUMAtriptan (IMITREX) 100 MG tablet, Take 100 mg by mouth every 2  (two) hours as needed for migraine. May repeat in 2 hours if headache persists or recurs., Disp: , Rfl:    topiramate (TOPAMAX) 25 MG tablet, TAKE ONE-HALF TABLET BY MOUTH ONCE A DAY AND TAKE ONE AND ONE-HALF TABLETS AT BEDTIME FOR 4 WEEKS, THEN TAKE ONE-HALF TABLET ONCE A DAY AND TAKE TWO TABLETS AT BEDTIME TO REDUCE MIGRAINE FREQUENCY/SEVERITY, Disp: , Rfl:    traZODone (DESYREL) 100 MG tablet, Take 1 tablet (100 mg total) by mouth at bedtime., Disp: 90 tablet, Rfl: 1  No orders of the defined types were placed in this encounter.   There are no Patient Instructions on file for this visit.   --Continue cardiac medications as reconciled in final medication list. --Return in about 1 year (around 03/10/2022) for Follow up primary prevention given risk factors. . Or sooner if needed. --Continue follow-up with your primary care physician regarding the management of your other chronic comorbid conditions.  Patient's questions and concerns were addressed to her satisfaction. She voices understanding of the instructions provided during this encounter.   This note was created using a voice recognition software as a result there may be grammatical errors inadvertently enclosed that do not reflect the nature of this encounter. Every attempt is  made to correct such errors.  Rex Kras, [place], [facility]  Pager: [phone] Office: [phone]

## 2021-03-14 DIAGNOSIS — M79643 Pain in unspecified hand: Secondary | ICD-10-CM | POA: Diagnosis not present

## 2021-03-14 DIAGNOSIS — M3500 Sicca syndrome, unspecified: Secondary | ICD-10-CM | POA: Diagnosis not present

## 2021-03-14 DIAGNOSIS — M1990 Unspecified osteoarthritis, unspecified site: Secondary | ICD-10-CM | POA: Diagnosis not present

## 2021-03-14 DIAGNOSIS — Z79899 Other long term (current) drug therapy: Secondary | ICD-10-CM | POA: Diagnosis not present

## 2021-04-26 DIAGNOSIS — J3489 Other specified disorders of nose and nasal sinuses: Secondary | ICD-10-CM | POA: Diagnosis not present

## 2021-04-26 DIAGNOSIS — R0981 Nasal congestion: Secondary | ICD-10-CM | POA: Diagnosis not present

## 2021-05-04 ENCOUNTER — Encounter: Payer: Federal, State, Local not specified - PPO | Admitting: Obstetrics & Gynecology

## 2021-06-02 DIAGNOSIS — I10 Essential (primary) hypertension: Secondary | ICD-10-CM | POA: Diagnosis not present

## 2021-06-20 DIAGNOSIS — Z79899 Other long term (current) drug therapy: Secondary | ICD-10-CM | POA: Diagnosis not present

## 2021-06-20 DIAGNOSIS — M1990 Unspecified osteoarthritis, unspecified site: Secondary | ICD-10-CM | POA: Diagnosis not present

## 2021-06-20 DIAGNOSIS — M3500 Sicca syndrome, unspecified: Secondary | ICD-10-CM | POA: Diagnosis not present

## 2021-06-20 DIAGNOSIS — M79643 Pain in unspecified hand: Secondary | ICD-10-CM | POA: Diagnosis not present

## 2021-06-20 DIAGNOSIS — M48062 Spinal stenosis, lumbar region with neurogenic claudication: Secondary | ICD-10-CM | POA: Diagnosis not present

## 2021-06-23 DIAGNOSIS — M3500 Sicca syndrome, unspecified: Secondary | ICD-10-CM | POA: Diagnosis not present

## 2021-06-29 DIAGNOSIS — M3509 Sicca syndrome with other organ involvement: Secondary | ICD-10-CM | POA: Diagnosis not present

## 2021-06-29 DIAGNOSIS — E119 Type 2 diabetes mellitus without complications: Secondary | ICD-10-CM | POA: Diagnosis not present

## 2021-06-29 DIAGNOSIS — M1990 Unspecified osteoarthritis, unspecified site: Secondary | ICD-10-CM | POA: Diagnosis not present

## 2021-06-29 DIAGNOSIS — H04123 Dry eye syndrome of bilateral lacrimal glands: Secondary | ICD-10-CM | POA: Diagnosis not present

## 2021-06-30 DIAGNOSIS — E1165 Type 2 diabetes mellitus with hyperglycemia: Secondary | ICD-10-CM | POA: Diagnosis not present

## 2021-06-30 DIAGNOSIS — I10 Essential (primary) hypertension: Secondary | ICD-10-CM | POA: Diagnosis not present

## 2021-07-05 NOTE — Therapy (Incomplete)
OUTPATIENT PHYSICAL THERAPY THORACOLUMBAR EVALUATION   Patient Name: Rebecca Hahn MRN: [id number] DOB:1955-09-24, 66 y.o., female Today's Date: 07/05/2021    Past Medical History:  Diagnosis Date   Agenesis of gallbladder    Alopecia    Anemia    Anxiety    Cognitive dysfunction    Colon polyp    DDD (degenerative disc disease), lumbar    Depression    Diabetes mellitus, type II (HCC)    Gastric polyp    GERD (gastroesophageal reflux disease)    Glaucoma    Gout    Headache    migraines   Hearing loss    Hyperlipidemia    Hypertension    Lumbar radiculopathy    Lumbar radiculopathy    Morbid obesity ([place])    Osteoarthritis    Sjoegren syndrome    Sleep apnea    CPAP   Vertigo    Past Surgical History:  Procedure Laterality Date   BACK SURGERY     CESAREAN SECTION     CHOLECYSTECTOMY     TUBAL LIGATION     Patient Active Problem List   Diagnosis Date Noted   Precordial pain    Lumbar spinal stenosis 08/10/2020    PCP: [name], MD  REFERRING PROVIDER: Consuella Lose, MD  REFERRING DIAG: [id number] (ICD-10-CM) - Spinal stenosis, lumbar region with neurogenic claudication  THERAPY DIAG:  No diagnosis found.  ONSET DATE: ***  SUBJECTIVE:                                                                                                                                                                                           SUBJECTIVE STATEMENT: *** PERTINENT HISTORY:  ***  PAIN:  Are you having pain? {yes/no:20286} NPRS scale: ***/10 Pain location: *** Pain orientation: {Pain Orientation:25161}  PAIN TYPE: {type:313116} Pain description: {PAIN DESCRIPTION:21022940}  Aggravating factors: *** Relieving factors: ***  PRECAUTIONS: {Therapy precautions:24002}  WEIGHT BEARING RESTRICTIONS {Yes ***/No:24003}  FALLS:  Has patient fallen in last 6 months? {yes/no:20286}, Number of falls: ***  LIVING ENVIRONMENT: Lives with: {[facility]  lives with:25569::"lives with their family"} Lives in: {Lives in:25570} Stairs: {yes/no:20286}; {Stairs:24000} Has following equipment at home: {Assistive devices:23999}  OCCUPATION: ***  PLOF: {PLOF:24004}  PATIENT GOALS ***   OBJECTIVE:   DIAGNOSTIC FINDINGS:  ***  PATIENT SURVEYS:  {rehab surveys:24030}  SCREENING FOR RED FLAGS: Bowel or bladder incontinence: {Yes/No:304960894} Spinal tumors: {Yes/No:304960894} Cauda equina syndrome: {Yes/No:304960894} Compression fracture: {Yes/No:304960894} Abdominal aneurysm: {Yes/No:304960894}  COGNITION:  Overall cognitive status: {cognition:24006}     SENSATION:  Light touch: {intact/deficits:24005}  Stereognosis: {intact/deficits:24005}  Hot/Cold: {intact/deficits:24005}  Proprioception: {intact/deficits:24005}  MUSCLE  LENGTH: Hamstrings: Right *** deg; Left *** deg Marcello Moores test: Right *** deg; Left *** deg  POSTURE:  ***  PALPATION: ***  LUMBARAROM/PROM  A/PROM A/PROM  07/05/2021  Flexion   Extension   Right lateral flexion   Left lateral flexion   Right rotation   Left rotation    (Blank rows = not tested)  LE AROM/PROM:  A/PROM Right 07/05/2021 Left 07/05/2021  Hip flexion    Hip extension    Hip abduction    Hip adduction    Hip internal rotation    Hip external rotation    Knee flexion    Knee extension    Ankle dorsiflexion    Ankle plantarflexion    Ankle inversion    Ankle eversion     (Blank rows = not tested)  LE MMT:  MMT Right 07/05/2021 Left 07/05/2021  Hip flexion    Hip extension    Hip abduction    Hip adduction    Hip internal rotation    Hip external rotation    Knee flexion    Knee extension    Ankle dorsiflexion    Ankle plantarflexion    Ankle inversion    Ankle eversion     (Blank rows = not tested)  LUMBAR SPECIAL TESTS:  {lumbar special test:25242}  FUNCTIONAL TESTS:  {Functional tests:24029}  GAIT: Distance walked: *** Assistive device utilized:  {Assistive devices:23999} Level of assistance: {Levels of assistance:24026} Comments: ***    TODAY'S TREATMENT  ***   PATIENT EDUCATION:  Education details: *** Person educated: {Person educated:25204} Education method: {Education Method:25205} Education comprehension: {Education Comprehension:25206}   HOME EXERCISE PROGRAM: ***  ASSESSMENT:  CLINICAL IMPRESSION: Patient is a *** y.o. *** who was seen today for physical therapy evaluation and treatment for ***. Objective impairments include {opptimpairments:25111}. These impairments are limiting patient from {activity limitations:25113}. Personal factors including {Personal factors:25162} are also affecting patient's functional outcome. Patient will benefit from skilled PT to address above impairments and improve overall function.  REHAB POTENTIAL: {rehabpotential:25112}  CLINICAL DECISION MAKING: {clinical decision making:25114}  EVALUATION COMPLEXITY: {Evaluation complexity:25115}   GOALS: Goals reviewed with patient? {yes/no:20286}  SHORT TERM GOALS:  STG Name Target Date Goal status  1 *** Baseline:  {follow up:25551} {GOALSTATUS:25110}  2 *** Baseline:  {follow up:25551} {GOALSTATUS:25110}  3 *** Baseline: {follow up:25551} {GOALSTATUS:25110}  4 *** Baseline: {follow up:25551} {GOALSTATUS:25110}  5 *** Baseline: {follow up:25551} {GOALSTATUS:25110}  6 *** Baseline: {follow up:25551} {GOALSTATUS:25110}  7 *** Baseline: {follow up:25551} {GOALSTATUS:25110}   LONG TERM GOALS:   LTG Name Target Date Goal status  1 *** Baseline: {follow up:25551} {GOALSTATUS:25110}  2 *** Baseline: {follow up:25551} {GOALSTATUS:25110}  3 *** Baseline: {follow up:25551} {GOALSTATUS:25110}  4 *** Baseline: {follow up:25551} {GOALSTATUS:25110}  5 *** Baseline: {follow up:25551} {GOALSTATUS:25110}  6 *** Baseline: {follow up:25551} {GOALSTATUS:25110}  7 *** Baseline: {follow up:25551} {GOALSTATUS:25110}   PLAN: PT  FREQUENCY: {rehab frequency:25116}  PT DURATION: {rehab duration:25117}  PLANNED INTERVENTIONS: {rehab planned interventions:25118::"Therapeutic exercises","Therapeutic activity","Neuro Muscular re-education","Balance training","Gait training","Patient/Family education","Joint mobilization"}  PLAN FOR NEXT SESSION: ***   ***

## 2021-07-07 ENCOUNTER — Ambulatory Visit: Payer: Federal, State, Local not specified - PPO | Admitting: Physical Therapy

## 2021-07-14 NOTE — Therapy (Signed)
OUTPATIENT PHYSICAL THERAPY THORACOLUMBAR EVALUATION   Patient Name: Rebecca Hahn MRN: [id number] DOB:04/23/1956, 66 y.o., female Today's Date: 07/19/2021   PT End of Session - 07/19/21 0847     Visit Number 1    Number of Visits 12    Authorization Type [employer] MCR  6th visit FOTO  10th visit FOTO    Progress Note Due on Visit 10    PT Start Time 0846    PT Stop Time 0929    PT Time Calculation (min) 43 min    Activity Tolerance Patient tolerated treatment well    Behavior During Therapy WFL for tasks assessed/performed             Past Medical History:  Diagnosis Date   Agenesis of gallbladder    Alopecia    Anemia    Anxiety    Cognitive dysfunction    Colon polyp    DDD (degenerative disc disease), lumbar    Depression    Diabetes mellitus, type II (HCC)    Gastric polyp    GERD (gastroesophageal reflux disease)    Glaucoma    Gout    Headache    migraines   Hearing loss    Hyperlipidemia    Hypertension    Lumbar radiculopathy    Lumbar radiculopathy    Morbid obesity (HCC)    Osteoarthritis    Sjoegren syndrome    Sleep apnea    CPAP   Vertigo    Past Surgical History:  Procedure Laterality Date   BACK SURGERY     CESAREAN SECTION     CHOLECYSTECTOMY     TUBAL LIGATION     Patient Active Problem List   Diagnosis Date Noted   Precordial pain    Lumbar spinal stenosis 08/10/2020    PCP: [name], MD  REFERRING PROVIDER: Consuella Lose, MD  REFERRING DIAG: [id number] (ICD-10-CM) - Spinal stenosis, lumbar region with neurogenic claudication  THERAPY DIAG:  Bilateral low back pain with sciatica, sciatica laterality unspecified, unspecified chronicity  Muscle weakness (generalized)  Stiffness of left hip, not elsewhere classified  ONSET DATE: PLIF 08-10-20 surgery  SUBJECTIVE:                                                                                                                                                                                            SUBJECTIVE STATEMENT: Pain has increased since my surgery on 08-10-20. I just got walking sticks and I want to sing in the choir at church and begin a walking program.  Pt reports increasing pain in back since surgery. Pt also reports lack of movement/lack  of exercise.  She does report feeling better when she moves.  Had trauma in family and has trouble sleeping.  Pt states she has pain in midline into buttocks and sometimes she feels like it " shimmies down her legs"   08-10-20 PROCEDURE: 1. L4 laminectomy with facetectomy for decompression of exiting nerve roots, more than would be required for placement of interbody graft 2. Placement of anterior interbody device - Medtronic expandable cage, short 19mm lordotic x2 3. Posterior non-segmental instrumentation using cortical pedicle screws at L4 - L5 4. Interbody arthrodesis, L4-5 5. Use of locally harvested bone autograft 6. Use of non-structural bone allograft - Proteos PERTINENT HISTORY:    Back surgery x 2, C section, cholecystectomy, tubal ligation, DDD, spinal stenosis, Sjogrens syndrome. OA, GERD, migraines. Non-insulin-dependent diabetes mellitus type 2, Sjogren's syndrome, carpal tunnel syndrome, gout, obesity due to excess calories, Hx of DVT 2015, OSA on CPAP, vertigo  see medical chart   PAIN:  Are you having pain? Yes NPRS scale: 5/10 at present and at worst 7/10 to 10/10 Pain location: toward midline and runs into bil buttocks and tingling down both legs Pain orientation: Bilateral  PAIN TYPE: chronic Pain description:  sore at incision site but feeling tingling down legs if she is up too long   Aggravating factors: shopping in the store with hands on basket, I try to stay in a bent position in the store, bending over for long periods of time.  Walking for longer than 5-10 minutes and must sit down Relieving factors: taking medication  PRECAUTIONS: None  WEIGHT BEARING RESTRICTIONS No  FALLS:   Has patient fallen in last 6 months? 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 Hardware intact. Anatomic alignment.  PATIENT SURVEYS:  FOTO 54%  57% predicted  SCREENING FOR RED FLAGS: Bowel or bladder incontinence: Yes: due to medication has had xrays Pt has examimn Spinal tumors: No Cauda equina syndrome: No   COGNITION:  Overall cognitive status: Within functional limits for tasks assessed     SENSATION:  Light touch: Deficits Pt describes a " shimmying" down legs after walking for more than 5 minutes  Stereognosis: Appears intact  Hot/Cold: Appears intact  Proprioception: Appears intact  MUSCLE LENGTH: Hamstrings: WFL Thomas test: bil tightness in flexors of hip  POSTURE:  Pt with increased abdominal girth.  Ant tilt pelvic levels even  PALPATION: Bil lumbar tightness and TTP over lumbar incision. Slight tenderness over bil buttocks  LUMBARAROM/PROM  A/PROM A/PROM  07/19/2021  Flexion Fingertips to bil ankles  Extension 20 degrees  Right lateral flexion Finger tip to R knee jt line  Left lateral flexion Finger tip to 2 in above knee jt line  Right rotation 50%  Left rotation 50%   (Blank rows = not tested)  LE AROM/PROM:  A/PROM Right 07/19/2021 Left 07/19/2021  Hip flexion 110 110  Hip extension    Hip abduction    Hip adduction    Hip internal rotation 35 23  Hip  external rotation 45 20  Knee flexion 125 130  Knee extension 5 5  Ankle dorsiflexion    Ankle  plantarflexion    Ankle inversion    Ankle eversion     (Blank rows = not tested)  LE MMT:  MMT Right 07/19/2021 Left 07/19/2021  Hip flexion 4 4  Hip extension 4 4-  Hip abduction 4 4-  Hip adduction    Hip internal rotation    Hip external rotation    Knee flexion 4 4  Knee extension 4+ 4  Ankle dorsiflexion 4 4  Ankle plantarflexion    Ankle inversion    Ankle eversion     (Blank rows = not tested)  LUMBAR SPECIAL TESTS:  NT  FUNCTIONAL TESTS:  5 times sit to stand: 15.3 6 minute walk test: TBA  GAIT: Distance walked: 150 Assistive device utilized: None Level of assistance: Complete Independence Comments: Pt cannot stand for longer than 5 mminutes without pain Pt tends to ambulate with wider base gait and shorter stride lengths   TODAY'S TREATMENT  Eval, POC and initial HEP   PATIENT EDUCATION:  Education details:  POC explanation of finding, FOTO report, initial HEP Person educated: Patient Education method: Explanation, Demonstration, Tactile cues, Verbal cues, and Handouts Education comprehension: verbalized understanding, returned demonstration, verbal cues required, and needs further education   HOME EXERCISE PROGRAM: Access Code: [email or web address] URL: [email or web address] Date: 07/19/2021 Prepared by: Voncille Lo  Exercises Supine Piriformis Stretch with Leg Straight - 1-2 x daily - 7 x weekly - 1 sets - 3 reps - 20-30 sec hold Supine Pelvic Tilt - 1 x daily - 7 x weekly - 3 sets - 10 reps Supine Single Knee to Chest Stretch - 2 x daily - 7 x weekly - 1 sets - 5 reps - 10 hold Supine Lower Trunk Rotation - 2 x daily - 7 x weekly - 1 sets - 5 reps - 20 hold Sit to stand with sink support Movement snack - 3 x daily - 7 x weekly - 1 sets - 10 reps   ASSESSMENT:  CLINICAL IMPRESSION: Patient is a 66 y.o. female who was seen today for physical therapy evaluation and treatment for back pain 1 year post op of PLIF L-4 L-5 with neurogenic  claudication.  Pt has not participated in any exercise since surgery and has developed increasing discomfort and pain in low back into bil buttocks with neurogenic claudication down into feet. Pt with stiff Left hip that impairs her to don her shoes.  Pt cannot tolerate standing or walking greater than 5-10 minutes.  She would like to increase time walking and standing so she can participate in recreation and exercise. Pt will benefit from skilled PT to address impairments   OBJECTIVE IMPAIRMENTS Abnormal gait, decreased activity tolerance, decreased balance, decreased mobility, decreased ROM, decreased strength, improper body mechanics, postural dysfunction, obesity, and pain.   ACTIVITY LIMITATIONS meal prep, shopping, and church.   PERSONAL FACTORS diabetes mellitus type 2, Sjogren's syndrome, carpal tunnel syndrome, gout, obesity due to excess calories, Hx of DVT 2015, OSA on CPAP, vertigo  are also affecting patient's functional outcome.    REHAB POTENTIAL: Good  CLINICAL DECISION MAKING: Evolving/moderate complexity  EVALUATION COMPLEXITY: Moderate   GOALS: Goals reviewed with patient? Yes  SHORT TERM GOALS:  STG Name Target Date Goal status  1 Pt will be independent with ini Baseline:  08/16/2021 INITIAL  2 Pt will be able to perform 5 x sit to stand in  13 sec or less to decrease risk of fall  Normal 13 sec of less Baseline:  08/16/2021 INITIAL  3 Pt will reduce pain form 7/10 to 4/10 with functional activities around home Baseline: 08/16/2021 INITIAL  4 Pt will begin progressive walking program Baseline: Not exercising 08/16/2021 INITIAL  5 Perform 6 MWT and establish LTG Baseline: 08/16/2021 INITIAL  6 Pt  would like to be able to stand for 15 min in order to stand and sing in choir at church with minimal pain Baseline: 08/16/2021 INITIAL   LONG TERM GOALS:   LTG Name Target Date Goal status  1 Pt will be independent with advanced home program Baseline: 09/13/2021 INITIAL   2 Pt will demonstrate 6 MWT in the 50th to 75th percentile 1500 ft to 1710 feet Baseline: 09/13/2021 INITIAL  3 Pt will be able to carry 20# of groceries for 100 feet  Baseline: unable to lift groceries from floor 09/13/2021 INITIAL  4 Pt will be doing routine walking program at least 4-5 times a week for health utilizing her walking sticks Baseline:unable to stand greater than 5-10 minutes 09/13/2021 INITIAL  5 Pt will be able to stand to do household chores for  20 minutes to complete task Baseline: unable to stand for more than 5 minutes 09/13/2021 INITIAL  6 FOTO will improve from  54%  to  57%   indicating improved functional mobility.  Baseline:eval 54% 09/13/2021 INITIAL   PLAN: PT FREQUENCY: 2x/week  PT DURATION: 8 weeks  PLANNED INTERVENTIONS: Therapeutic exercises, Therapeutic activity, Neuromuscular re-education, Balance training, Gait training, Patient/Family education, Joint mobilization, Stair training, Aquatic Therapy, Dry Needling, Electrical stimulation, Cryotherapy, Moist heat, Taping, Manual therapy, and Neuro Muscular re-education  PLAN FOR NEXT SESSION: Aquatics and basic back.  Pt is one year post PLIF L-4/L-5  issue walking program and gradually walk for time. Tolerating 5/6 pain in legs due to neurogenic claudication  Voncille Lo, PT, [facility] Certified Exercise Expert for the Aging Adult  07/19/21 8:07 PM Phone: [phone] Fax: [phone]

## 2021-07-19 ENCOUNTER — Other Ambulatory Visit: Payer: Self-pay

## 2021-07-19 ENCOUNTER — Ambulatory Visit: Payer: Medicare Other | Attending: Neurosurgery | Admitting: Physical Therapy

## 2021-07-19 ENCOUNTER — Encounter: Payer: Self-pay | Admitting: Physical Therapy

## 2021-07-19 DIAGNOSIS — M5440 Lumbago with sciatica, unspecified side: Secondary | ICD-10-CM | POA: Diagnosis not present

## 2021-07-19 DIAGNOSIS — M6281 Muscle weakness (generalized): Secondary | ICD-10-CM | POA: Insufficient documentation

## 2021-07-19 DIAGNOSIS — M25652 Stiffness of left hip, not elsewhere classified: Secondary | ICD-10-CM | POA: Insufficient documentation

## 2021-07-19 NOTE — Patient Instructions (Addendum)
Access Code: [email or web address] URL: [email or web address] Date: 07/19/2021 Prepared by: Garen Lah  Exercises Supine Piriformis Stretch with Leg Straight - 1-2 x daily - 7 x weekly - 1 sets - 3 reps - 20-30 sec hold Supine Pelvic Tilt - 1 x daily - 7 x weekly - 3 sets - 10 reps Supine Single Knee to Chest Stretch - 2 x daily - 7 x weekly - 1 sets - 5 reps - 10 hold Supine Lower Trunk Rotation - 2 x daily - 7 x weekly - 1 sets - 5 reps - 20 hold Sit to stand with sink support Movement snack - 3 x daily - 7 x weekly - 1 sets - 10 reps  Aquatic Therapy at [facility]-  What to Expect!  Where:   [facility] [address] [place], [place] [address] Rehab phone [phone]  NOTE:  You will receive an automated phone message reminding you of your appt and it will say the appointment is at the [facility] clinic.          How to Prepare: Please make sure you drink 8 ounces of water about one hour prior to your pool session A caregiver may attend if needed with the patient to help assist as needed. A caregiver can sit in the pool room on chair. Please arrive IN YOUR SUIT and 15 minutes prior to your appointment - this helps to avoid delays in starting your session. Please make sure to attend to any toileting needs prior to entering the pool [place] rooms for changing are provided.   There is direct access to the pool deck form the locker room.  You can lock your belongings in a locker with lock provided. Once on the pool deck your therapist will ask if you have signed the Patient  Consent and Assignment of Benefits form before beginning treatment Your therapist may take your blood pressure prior to, during and after your session if indicated We usually try and create a home exercise program based on activities we do in the pool.  Please be thinking about who might be able to assist you in the pool should you need to  participate in an aquatic home exercise program at the time of discharge if you need assistance.  Some patients do not want to or do not have the ability to participate in an aquatic home program - this is not a barrier in any way to you participating in aquatic therapy as part of your current therapy plan! After Discharge from PT, you can continue using home program at  the [facility], there is a drop-in fee for $5 ($45 a month)or for 60 years  or older $4.00 ($40 a month for seniors ) or any local [facility] pool.  Memberships for purchase are available for gym/pool at [facility]  IT IS VERY IMPORTANT THAT YOUR LAST VISIT BE IN THE CLINIC AT [facility] AFTER YOUR LAST AQUATIC VISIT.  PLEASE MAKE SURE THAT YOU HAVE A [address]  APPOINTMENT SCHEDULED.   About the pool: Pool is located approximately 500 FT from the entrance of the building.  Please bring a support person if you need assistance traveling this      distance.   Your therapist will assist you in entering the water; there are two ways to           enter: stairs with railings, and a mechanical lift. Your therapist will determine the most appropriate way  for you.  Water temperature is usually between 88-90 degrees  There may be up to 2 other swimmers in the pool at the same time  The pool deck is tile, please wear shoes with good traction if you prefer not to be barefoot.    Contact Info:  For appointment scheduling and cancellations:         Please call the [facility]  PH:[phone]              Aquatic Therapy  Outpatient Rehabilitation @ [facility]       All sessions are 45 minutes                                                      Garen Lah, PT, [facility] Certified Exercise Expert for the Aging Adult  07/19/21 9:20 AM Phone: [phone] Fax: [phone]

## 2021-07-22 ENCOUNTER — Encounter (HOSPITAL_BASED_OUTPATIENT_CLINIC_OR_DEPARTMENT_OTHER): Payer: Self-pay | Admitting: Physical Therapy

## 2021-07-22 ENCOUNTER — Other Ambulatory Visit: Payer: Self-pay

## 2021-07-22 ENCOUNTER — Ambulatory Visit (HOSPITAL_BASED_OUTPATIENT_CLINIC_OR_DEPARTMENT_OTHER): Payer: Medicare Other | Attending: Neurosurgery | Admitting: Physical Therapy

## 2021-07-22 DIAGNOSIS — M5440 Lumbago with sciatica, unspecified side: Secondary | ICD-10-CM | POA: Insufficient documentation

## 2021-07-22 DIAGNOSIS — M25652 Stiffness of left hip, not elsewhere classified: Secondary | ICD-10-CM | POA: Insufficient documentation

## 2021-07-22 DIAGNOSIS — M6281 Muscle weakness (generalized): Secondary | ICD-10-CM | POA: Diagnosis not present

## 2021-07-22 NOTE — Therapy (Signed)
OUTPATIENT PHYSICAL THERAPY TREATMENT NOTE   Patient Name: Rebecca Hahn MRN: [id number] DOB:1955/06/01, 66 y.o., female Today's Date: 07/22/2021  PCP: Georgianne Fickamachandran, Ajith, MD REFERRING PROVIDER: Lisbeth RenshawNundkumar, Neelesh, MD   PT End of Session - 07/22/21 [id number]     Visit Number 2    Number of Visits 12    Authorization Type BCBS MCR  6th visit FOTO  10th visit FOTO    Authorization - Visit Number 2    Progress Note Due on Visit 10    PT Start Time 0803    PT Stop Time 0842    PT Time Calculation (min) 39 min    Activity Tolerance Patient tolerated treatment well    Behavior During Therapy WFL for tasks assessed/performed             Past Medical History:  Diagnosis Date   Agenesis of gallbladder    Alopecia    Anemia    Anxiety    Cognitive dysfunction    Colon polyp    DDD (degenerative disc disease), lumbar    Depression    Diabetes mellitus, type II (HCC)    Gastric polyp    GERD (gastroesophageal reflux disease)    Glaucoma    Gout    Headache    migraines   Hearing loss    Hyperlipidemia    Hypertension    Lumbar radiculopathy    Lumbar radiculopathy    Morbid obesity (HCC)    Osteoarthritis    Sjoegren syndrome    Sleep apnea    CPAP   Vertigo    Past Surgical History:  Procedure Laterality Date   BACK SURGERY     CESAREAN SECTION     CHOLECYSTECTOMY     TUBAL LIGATION     Patient Active Problem List   Diagnosis Date Noted   Precordial pain    Lumbar spinal stenosis 08/10/2020   REFERRING PROVIDER: Lisbeth RenshawNundkumar, Neelesh, MD   REFERRING DIAG: [id number] (ICD-10-CM) - Spinal stenosis, lumbar region with neurogenic claudication   THERAPY DIAG:  Bilateral low back pain with sciatica, sciatica laterality unspecified, unspecified chronicity   Muscle weakness (generalized)   Stiffness of left hip, not elsewhere classified   ONSET DATE: PLIF 08-10-20 surgery   SUBJECTIVE:                                                                                                                                                                                             SUBJECTIVE STATEMENT:  Pt reports yesterday she had a lot of pain so she was in bed all day.  She has not done much of  HEP yet. She took hot shower this morning so her back is "calm".    PERTINENT HISTORY:                      Back surgery x 2, C section, cholecystectomy, tubal ligation, DDD, spinal stenosis, Sjogrens syndrome. OA, GERD, migraines. Non-insulin-dependent diabetes mellitus type 2, Sjogren's syndrome, carpal tunnel syndrome, gout, obesity due to excess calories, Hx of DVT 2015, OSA on CPAP, vertigo  see medical chart  08-10-20 PROCEDURE: 1. L4 laminectomy with facetectomy for decompression of exiting nerve roots, more than would be required for placement of interbody graft 2. Placement of anterior interbody device - Medtronic expandable cage, short 66mm lordotic x2 3. Posterior non-segmental instrumentation using cortical pedicle screws at L4 - L5 4. Interbody arthrodesis, L4-5 5. Use of locally harvested bone autograft 6. Use of non-structural bone allograft - Proteos     PAIN:  Are you having pain? Yes NPRS scale: 3/10 at present  Pain location: toward midline and runs into bil buttocks  Pain orientation: Bilateral  PAIN TYPE: chronic Pain description:  sore at incision site but feeling tingling down legs if she is up too long   Aggravating factors: shopping in the store with hands on basket, I try to stay in a bent position in the store, bending over for long periods of time.  Walking for longer than 5-10 minutes and must sit down Relieving factors: taking medication   PRECAUTIONS: None   PATIENT GOALS I want to be able to manage my pain so I can walk  I would like to sing in my church choir and use my walking sticks     OBJECTIVE:    DIAGNOSTIC FINDINGS:  08-10-21 L4-L5 posterior and interbody lumbar fusion. Hardware intact. Anatomic alignment.   PATIENT  SURVEYS:  FOTO 54%  57% predicted   SCREENING FOR RED FLAGS: Bowel or bladder incontinence: Yes: due to medication has had xrays Pt has examimn Spinal tumors: No Cauda equina syndrome: No     COGNITION:          Overall cognitive status: Within functional limits for tasks assessed                        SENSATION:          Light touch: Deficits Pt describes a " shimmying" down legs after walking for more than 5 minutes          Stereognosis: Appears intact          Hot/Cold: Appears intact          Proprioception: Appears intact   MUSCLE LENGTH: Hamstrings: WFL Thomas test: bil tightness in flexors of hip   POSTURE:  Pt with increased abdominal girth.  Ant tilt pelvic levels even   PALPATION: Bil lumbar tightness and TTP over lumbar incision. Slight tenderness over bil buttocks   LUMBARAROM/PROM   A/PROM A/PROM  07/19/2021  Flexion Fingertips to bil ankles  Extension 20 degrees  Right lateral flexion Finger tip to R knee jt line  Left lateral flexion Finger tip to 2 in above knee jt line  Right rotation 50%  Left rotation 50%   (Blank rows = not tested)   LE AROM/PROM:   A/PROM Right 07/19/2021 Left 07/19/2021  Hip flexion 110 110  Hip extension      Hip abduction      Hip adduction      Hip internal rotation 35  23  Hip external rotation 45 20  Knee flexion 125 130  Knee extension 5 5  Ankle dorsiflexion      Ankle plantarflexion      Ankle inversion      Ankle eversion       (Blank rows = not tested)   LE MMT:   MMT Right 07/19/2021 Left 07/19/2021  Hip flexion 4 4  Hip extension 4 4-  Hip abduction 4 4-  Hip adduction      Hip internal rotation      Hip external rotation      Knee flexion 4 4  Knee extension 4+ 4  Ankle dorsiflexion 4 4  Ankle plantarflexion      Ankle inversion      Ankle eversion       (Blank rows = not tested)   LUMBAR SPECIAL TESTS:  NT   FUNCTIONAL TESTS:  5 times sit to stand: 15.3 6 minute walk test: TBA    GAIT: Distance walked: 150 Assistive device utilized: None Level of assistance: Complete Independence Comments: Pt cannot stand for longer than 5 mminutes without pain Pt tends to ambulate with wider base gait and shorter stride lengths     TODAY'S TREATMENT  07/22/21:  Pt seen for aquatic therapy today.  Treatment took place in water 3.25-4 ft in depth at the [facility] pool. Temp of water was 96.  Pt entered/exited the pool via stairs independently with bilat rail.  Pt educated regarding dynamics of water; resistance, buoyancy, head position affecting body position (ie: head back makes feet float and vice versa).   Forward walking, backwards walking, side stepping, marching holding onto floatation barbell (SBA, pt guarded when away from wall)   Holding onto wall:  Squats with cues for form (was overarching back) x 10 Heel toe raises Hip abdct x 10 each leg,  Hip ext x 10 each leg Sit to/from stand at bench in water, core engaged.   Lt/Rt hamstring stretch with foot on step x 20 sec x 2   Pt requires buoyancy for support and to offload joints with strengthening exercises. Viscosity of the water is needed for resistance of strengthening; water current perturbations provides challenge to standing balance unsupported, requiring increased core activation.    PATIENT EDUCATION:  Education details:  Intro to [employer], reviewed (verbally) HEP Person educated: Patient Education method: Explanation, Demonstration, Actor cues, Verbal cues Education comprehension: verbalized understanding, returned demonstration, verbal cues required, and needs further education     HOME EXERCISE PROGRAM: Access Code: [email or web address] URL: [email or web address] Date: 07/19/2021 Prepared by: Garen Lah   Exercises Supine Piriformis Stretch with Leg Straight - 1-2 x daily - 7 x weekly - 1 sets - 3 reps - 20-30 sec hold Supine Pelvic Tilt - 1 x daily - 7 x weekly - 3 sets - 10  reps Supine Single Knee to Chest Stretch - 2 x daily - 7 x weekly - 1 sets - 5 reps - 10 hold Supine Lower Trunk Rotation - 2 x daily - 7 x weekly - 1 sets - 5 reps - 20 hold Sit to stand with sink support Movement snack - 3 x daily - 7 x weekly - 1 sets - 10 reps     ASSESSMENT:   CLINICAL IMPRESSION: Pt requires SBA in the water at this time due to fear of loss of balance. She requires UE assist on floatation device or wall.   She required some cues on posture and form.  Overall tolerated aquatic exercises without increase in pain. Goals are ongoing.       OBJECTIVE IMPAIRMENTS Abnormal gait, decreased activity tolerance, decreased balance, decreased mobility, decreased ROM, decreased strength, improper body mechanics, postural dysfunction, obesity, and pain.    ACTIVITY LIMITATIONS meal prep, shopping, and church.    PERSONAL FACTORS diabetes mellitus type 2, Sjogren's syndrome, carpal tunnel syndrome, gout, obesity due to excess calories, Hx of DVT 2015, OSA on CPAP, vertigo  are also affecting patient's functional outcome.      REHAB POTENTIAL: Good   CLINICAL DECISION MAKING: Evolving/moderate complexity   EVALUATION COMPLEXITY: Moderate     GOALS: Goals reviewed with patient? Yes   SHORT TERM GOALS:   STG Name Target Date Goal status  1 Pt will be independent with ini Baseline:  08/16/2021 INITIAL  2 Pt will be able to perform 5 x sit to stand in 13 sec or less to decrease risk of fall  Normal 13 sec of less Baseline:  08/16/2021 INITIAL  3 Pt will reduce pain form 7/10 to 4/10 with functional activities around home Baseline: 08/16/2021 INITIAL  4 Pt will begin progressive walking program Baseline: Not exercising 08/16/2021 INITIAL  5 Perform 6 MWT and establish LTG Baseline: 08/16/2021 INITIAL  6 Pt  would like to be able to stand for 15 min in order to stand and sing in choir at church with minimal pain Baseline: 08/16/2021 INITIAL    LONG TERM GOALS:    LTG Name  Target Date Goal status  1 Pt will be independent with advanced home program Baseline: 09/13/2021 INITIAL  2 Pt will demonstrate 6 MWT in the 50th to 75th percentile 1500 ft to 1710 feet Baseline: 09/13/2021 INITIAL  3 Pt will be able to carry 20# of groceries for 100 feet  Baseline: unable to lift groceries from floor 09/13/2021 INITIAL  4 Pt will be doing routine walking program at least 4-5 times a week for health utilizing her walking sticks Baseline:unable to stand greater than 5-10 minutes 09/13/2021 INITIAL  5 Pt will be able to stand to do household chores for  20 minutes to complete task Baseline: unable to stand for more than 5 minutes 09/13/2021 INITIAL  6 FOTO will improve from  54%  to  57%   indicating improved functional mobility.  Baseline:eval 54% 09/13/2021 INITIAL    PLAN: PT FREQUENCY: 2x/week   PT DURATION: 8 weeks   PLANNED INTERVENTIONS: Therapeutic exercises, Therapeutic activity, Neuromuscular re-education, Balance training, Gait training, Patient/Family education, Joint mobilization, Stair training, Aquatic Therapy, Dry Needling, Electrical stimulation, Cryotherapy, Moist heat, Taping, Manual therapy, and Neuro Muscular re-education   PLAN FOR NEXT SESSION: Aquatics and basic back.  Pt is one year post PLIF L-4/L-5  issue walking program and gradually walk for time.  Trial back float.

## 2021-07-26 ENCOUNTER — Encounter: Payer: Self-pay | Admitting: Physical Therapy

## 2021-07-26 ENCOUNTER — Ambulatory Visit: Payer: Medicare Other | Attending: Neurosurgery | Admitting: Physical Therapy

## 2021-07-26 ENCOUNTER — Other Ambulatory Visit: Payer: Self-pay

## 2021-07-26 DIAGNOSIS — M6281 Muscle weakness (generalized): Secondary | ICD-10-CM | POA: Diagnosis not present

## 2021-07-26 DIAGNOSIS — M5440 Lumbago with sciatica, unspecified side: Secondary | ICD-10-CM

## 2021-07-26 DIAGNOSIS — M25652 Stiffness of left hip, not elsewhere classified: Secondary | ICD-10-CM

## 2021-07-26 NOTE — Therapy (Signed)
OUTPATIENT PHYSICAL THERAPY TREATMENT NOTE   Patient Name: Rebecca Hahn MRN: [id number] DOB:1956-02-19, 66 y.o., female Today's Date: 07/26/2021  PCP: [name], MD REFERRING PROVIDER: [name], MD   PT End of Session - 07/26/21 [id number]     Visit Number 3    Number of Visits 12    Date for PT Re-Evaluation 09/13/21    Authorization Type BCBS MCR  6th visit FOTO  10th visit FOTO    Authorization - Visit Number 3    Progress Note Due on Visit 10    PT Start Time 0725   pt arrived late   PT Stop Time 0755    PT Time Calculation (min) 30 min             Past Medical History:  Diagnosis Date   Agenesis of gallbladder    Alopecia    Anemia    Anxiety    Cognitive dysfunction    Colon polyp    DDD (degenerative disc disease), lumbar    Depression    Diabetes mellitus, type II (HCC)    Gastric polyp    GERD (gastroesophageal reflux disease)    Glaucoma    Gout    Headache    migraines   Hearing loss    Hyperlipidemia    Hypertension    Lumbar radiculopathy    Lumbar radiculopathy    Morbid obesity ([place])    Osteoarthritis    Sjoegren syndrome    Sleep apnea    CPAP   Vertigo    Past Surgical History:  Procedure Laterality Date   BACK SURGERY     CESAREAN SECTION     CHOLECYSTECTOMY     TUBAL LIGATION     Patient Active Problem List   Diagnosis Date Noted   Precordial pain    Lumbar spinal stenosis 08/10/2020   REFERRING PROVIDER: Consuella Lose, MD   REFERRING DIAG: [phone] (ICD-10-CM) - Spinal stenosis, lumbar region with neurogenic claudication   THERAPY DIAG:  Bilateral low back pain with sciatica, sciatica laterality unspecified, unspecified chronicity   Muscle weakness (generalized)   Stiffness of left hip, not elsewhere classified   ONSET DATE: PLIF 08-10-20 surgery  PERTINENT HISTORY:  Back surgery x 2, C section, cholecystectomy, tubal ligation, DDD, spinal stenosis, Sjogrens syndrome. OA, GERD, migraines.  Non-insulin-dependent diabetes mellitus type 2, Sjogren's syndrome, carpal tunnel syndrome, gout, obesity due to excess calories, Hx of DVT 2015, OSA on CPAP, vertigo  see medical chart  08-10-20 PROCEDURE: 1. L4 laminectomy with facetectomy for decompression of exiting nerve roots, more than would be required for placement of interbody graft 2. Placement of anterior interbody device - Medtronic expandable cage, short 10mm lordotic x2 3. Posterior non-segmental instrumentation using cortical pedicle screws at L4 - L5 4. Interbody arthrodesis, L4-5 5. Use of locally harvested bone autograft 6. Use of non-structural bone allograft - Proteos     SUBJECTIVE:     "Aquatic Therapy was good. I had to cancel those appointments for now due to my other appointments. I have been doing the exercises she gave me. I have been standing up and sitting down more often."  PAIN:  Are you having pain? Yes NPRS scale: 3/10 at present  Pain location: toward midline and runs into bil buttocks , more left sided today Pain orientation: Bilateral  PAIN TYPE: chronic Pain description:  sore at incision site but feeling tingling down legs if she is up too long   Aggravating factors: shopping in the store with hands on basket, I try to stay in a bent position in the store, bending over for long periods of time.  Walking for longer than 5-10 minutes and must sit down Relieving factors: taking medication   PRECAUTIONS: None   PATIENT GOALS I want to be able to manage my pain so I can walk  I would like to sing in my church choir and use my walking sticks     OBJECTIVE:    DIAGNOSTIC FINDINGS:  08-10-21 L4-L5 posterior and interbody lumbar fusion. Hardware intact. Anatomic alignment.   PATIENT SURVEYS:  FOTO 54%  57% predicted   SCREENING FOR RED FLAGS: Bowel or bladder  incontinence: Yes: due to medication has had xrays Pt has examimn Spinal tumors: No Cauda equina syndrome: No     COGNITION:          Overall cognitive status: Within functional limits for tasks assessed                        SENSATION:          Light touch: Deficits Pt describes a " shimmying" down legs after walking for more than 5 minutes          Stereognosis: Appears intact          Hot/Cold: Appears intact          Proprioception: Appears intact   MUSCLE LENGTH: Hamstrings: WFL Thomas test: bil tightness in flexors of hip   POSTURE:  Pt with increased abdominal girth.  Ant tilt pelvic levels even   PALPATION: Bil lumbar tightness and TTP over lumbar incision. Slight tenderness over bil buttocks   LUMBARAROM/PROM   A/PROM A/PROM  07/19/2021  Flexion Fingertips to bil ankles  Extension 20 degrees  Right lateral flexion Finger tip to R knee jt line  Left lateral flexion Finger tip to 2 in above knee jt line  Right rotation 50%  Left rotation 50%   (Blank rows = not tested)   LE AROM/PROM:   A/PROM Right 07/19/2021 Left 07/19/2021  Hip flexion 110 110  Hip extension      Hip abduction      Hip adduction      Hip internal rotation 35 23  Hip external rotation 45 20  Knee flexion 125 130  Knee extension 5 5  Ankle dorsiflexion      Ankle plantarflexion      Ankle inversion      Ankle eversion       (Blank rows = not tested)   LE MMT:   MMT Right 07/19/2021 Left 07/19/2021  Hip flexion 4 4  Hip extension 4 4-  Hip abduction 4 4-  Hip adduction      Hip internal rotation      Hip external rotation      Knee flexion 4 4  Knee extension 4+ 4  Ankle dorsiflexion 4 4  Ankle plantarflexion      Ankle inversion      Ankle eversion       (Blank rows = not tested)   LUMBAR SPECIAL TESTS:  NT  FUNCTIONAL TESTS:  5 times sit to stand: 15.3 6 minute walk test: 843 ft with pain increase to 7/10 # standing rest breaks and 1 seated.    GAIT: Distance  walked: 150 Assistive device utilized: None Level of assistance: Complete Independence Comments: Pt cannot stand for longer than 5 mminutes without pain Pt tends to ambulate with wider base gait and shorter stride lengths  [facility] Adult PT Treatment:                                                DATE: 07/26/21 Therapeutic Exercise: 6 MWT 843 feet  (3 standing RB, 1 seated RB)  Seated physioball childs pose x 5 for 10 sec  LTR PPT Figure 4 leg straight Piriformis leg straight Log roll to transfer x 2      AQUATIC TREATMENT 07/21/21: -See daily note  Initial TREATMENT 07/19/21 Eval, POC and initial HEP     PATIENT EDUCATION:  Education details:  Intro to [employer], reviewed (verbally) HEP Person educated: Patient Education method: Consulting civil engineer, Demonstration, Corporate treasurer cues, Verbal cues Education comprehension: verbalized understanding, returned demonstration, verbal cues required, and needs further education     HOME EXERCISE PROGRAM: Access Code: [email or web address] URL: [email or web address] Date: 07/19/2021 Prepared by: Voncille Lo   Exercises Supine Piriformis Stretch with Leg Straight - 1-2 x daily - 7 x weekly - 1 sets - 3 reps - 20-30 sec hold Supine Pelvic Tilt - 1 x daily - 7 x weekly - 3 sets - 10 reps Supine Single Knee to Chest Stretch - 2 x daily - 7 x weekly - 1 sets - 5 reps - 10 hold Supine Lower Trunk Rotation - 2 x daily - 7 x weekly - 1 sets - 5 reps - 20 hold Sit to stand with sink support Movement snack - 3 x daily - 7 x weekly - 1 sets - 10 reps     ASSESSMENT:   CLINICAL IMPRESSION: Pt reports the aquatic session was good. She cannot attend for awhile due to [facility] appointments coming up. 6MWT captured with pt requiring 3 standing rest breaks and 1 seated rest break due to fatigue and increased back pain. Reviewed HEP and pt demonstrates independence and reports compliance. She is performing sit-stands instead of sink squats. At end of session she  reported improvement in pain after stretching.      OBJECTIVE IMPAIRMENTS Abnormal gait, decreased activity tolerance, decreased balance, decreased mobility, decreased ROM, decreased strength, improper body mechanics, postural dysfunction, obesity, and pain.    ACTIVITY LIMITATIONS meal prep, shopping, and church.    PERSONAL FACTORS diabetes mellitus type 2, Sjogren's syndrome, carpal tunnel syndrome, gout, obesity due to excess calories, Hx of DVT 2015, OSA on CPAP, vertigo  are also affecting patient's functional outcome.      REHAB POTENTIAL: Good   CLINICAL DECISION MAKING: Evolving/moderate complexity   EVALUATION COMPLEXITY: Moderate     GOALS: Goals reviewed with patient? Yes   SHORT TERM GOALS:   STG Name Target Date Goal status  1 Pt will be independent with ini Baseline:  08/16/2021 INITIAL  2 Pt will be able to perform 5 x sit to stand in 13 sec or less to decrease risk of fall  Normal 13 sec of less Baseline:  08/16/2021 INITIAL  3 Pt will reduce pain form 7/10 to 4/10 with functional activities around  home Baseline: 08/16/2021 INITIAL  4 Pt will begin progressive walking program Baseline: Not exercising 08/16/2021 INITIAL  5 Perform 6 MWT and establish LTG Baseline: 08/16/2021 INITIAL  6 Pt  would like to be able to stand for 15 min in order to stand and sing in choir at church with minimal pain Baseline: 08/16/2021 INITIAL    LONG TERM GOALS:    LTG Name Target Date Goal status  1 Pt will be independent with advanced home program Baseline: 09/13/2021 INITIAL  2 Pt will demonstrate 6 MWT in the 50th to 75th percentile 1500 ft to 1710 feet Baseline: 843 feet 08/15/21 09/13/2021 INITIAL  3 Pt will be able to carry 20# of groceries for 100 feet  Baseline: unable to lift groceries from floor 09/13/2021 INITIAL  4 Pt will be doing routine walking program at least 4-5 times a week for health utilizing her walking sticks Baseline:unable to stand greater than 5-10 minutes  09/13/2021 INITIAL  5 Pt will be able to stand to do household chores for  20 minutes to complete task Baseline: unable to stand for more than 5 minutes 09/13/2021 INITIAL  6 FOTO will improve from  54%  to  57%   indicating improved functional mobility.  Baseline:eval 54% 09/13/2021 INITIAL    PLAN: PT FREQUENCY: 2x/week   PT DURATION: 8 weeks   PLANNED INTERVENTIONS: Therapeutic exercises, Therapeutic activity, Neuromuscular re-education, Balance training, Gait training, Patient/Family education, Joint mobilization, Stair training, Aquatic Therapy, Dry Needling, Electrical stimulation, Cryotherapy, Moist heat, Taping, Manual therapy, and Neuro Muscular re-education   PLAN FOR NEXT SESSION: Aquatics and basic back.  Pt is one year post PLIF L-4/L-5  issue walking program and gradually walk for time.  Trial back float.

## 2021-07-27 DIAGNOSIS — H25813 Combined forms of age-related cataract, bilateral: Secondary | ICD-10-CM | POA: Diagnosis not present

## 2021-07-27 DIAGNOSIS — H04123 Dry eye syndrome of bilateral lacrimal glands: Secondary | ICD-10-CM | POA: Diagnosis not present

## 2021-07-27 DIAGNOSIS — E1136 Type 2 diabetes mellitus with diabetic cataract: Secondary | ICD-10-CM | POA: Diagnosis not present

## 2021-07-27 DIAGNOSIS — M3501 Sicca syndrome with keratoconjunctivitis: Secondary | ICD-10-CM | POA: Diagnosis not present

## 2021-07-28 DIAGNOSIS — M3501 Sicca syndrome with keratoconjunctivitis: Secondary | ICD-10-CM | POA: Diagnosis not present

## 2021-07-28 DIAGNOSIS — E1165 Type 2 diabetes mellitus with hyperglycemia: Secondary | ICD-10-CM | POA: Diagnosis not present

## 2021-07-28 DIAGNOSIS — Z0000 Encounter for general adult medical examination without abnormal findings: Secondary | ICD-10-CM | POA: Diagnosis not present

## 2021-07-28 DIAGNOSIS — R5383 Other fatigue: Secondary | ICD-10-CM | POA: Diagnosis not present

## 2021-07-29 ENCOUNTER — Ambulatory Visit (HOSPITAL_BASED_OUTPATIENT_CLINIC_OR_DEPARTMENT_OTHER): Payer: Self-pay | Admitting: Physical Therapy

## 2021-08-02 ENCOUNTER — Ambulatory Visit: Payer: Medicare Other | Admitting: Physical Therapy

## 2021-08-05 ENCOUNTER — Ambulatory Visit (HOSPITAL_BASED_OUTPATIENT_CLINIC_OR_DEPARTMENT_OTHER): Payer: Self-pay | Admitting: Physical Therapy

## 2021-08-09 ENCOUNTER — Encounter: Payer: Self-pay | Admitting: Physical Therapy

## 2021-08-09 ENCOUNTER — Ambulatory Visit: Payer: Medicare Other | Admitting: Physical Therapy

## 2021-08-09 ENCOUNTER — Other Ambulatory Visit: Payer: Self-pay

## 2021-08-09 DIAGNOSIS — M5440 Lumbago with sciatica, unspecified side: Secondary | ICD-10-CM

## 2021-08-09 DIAGNOSIS — M6281 Muscle weakness (generalized): Secondary | ICD-10-CM

## 2021-08-09 DIAGNOSIS — M25652 Stiffness of left hip, not elsewhere classified: Secondary | ICD-10-CM | POA: Diagnosis not present

## 2021-08-09 NOTE — Patient Instructions (Signed)

## 2021-08-09 NOTE — Therapy (Signed)
?OUTPATIENT PHYSICAL THERAPY TREATMENT NOTE ? ? ?Patient Name: Rebecca Hahn ?MRN: [id number] ?DOB:[birth year], 66 y.o., female ?Today's Date: 08/09/2021 ? ?PCP: [name], MD ?REFERRING PROVIDER: [name], MD ? ? PT End of Session - 08/09/21 0726   ? ? Visit Number 4   ? Number of Visits 12   ? Date for PT Re-Evaluation 09/13/21   ? Authorization Type [employer] MCR  6th visit FOTO  10th visit FOTO   ? Progress Note Due on Visit 10   ? PT Start Time [id number]   ? PT Stop Time 0801   ? PT Time Calculation (min) 38 min   ? ?  ?  ? ?  ? ? ?Past Medical History:  ?Diagnosis Date  ? Agenesis of gallbladder   ? Alopecia   ? Anemia   ? Anxiety   ? Cognitive dysfunction   ? Colon polyp   ? DDD (degenerative disc disease), lumbar   ? Depression   ? Diabetes mellitus, type II ([place])   ? Gastric polyp   ? GERD (gastroesophageal reflux disease)   ? Glaucoma   ? Gout   ? Headache   ? migraines  ? Hearing loss   ? Hyperlipidemia   ? Hypertension   ? Lumbar radiculopathy   ? Lumbar radiculopathy   ? Morbid obesity ([place])   ? Osteoarthritis   ? Sjoegren syndrome   ? Sleep apnea   ? CPAP  ? Vertigo   ? ?Past Surgical History:  ?Procedure Laterality Date  ? BACK SURGERY    ? CESAREAN SECTION    ? CHOLECYSTECTOMY    ? TUBAL LIGATION    ? ?Patient Active Problem List  ? Diagnosis Date Noted  ? Precordial pain   ? Lumbar spinal stenosis 08/10/2020  ? ?REFERRING PROVIDER: Consuella Lose, MD ?  ?REFERRING DIAG: [id number] (ICD-10-CM) - Spinal stenosis, lumbar region with neurogenic claudication ?  ?THERAPY DIAG:  ?Bilateral low back pain with sciatica, sciatica laterality unspecified, unspecified chronicity ?  ?Muscle weakness (generalized) ?  ?Stiffness of left hip, not elsewhere classified ?  ?ONSET DATE: PLIF 08-10-20 surgery ? ?PERTINENT HISTORY:  ?Back surgery x 2, C section, cholecystectomy, tubal ligation, DDD, spinal stenosis, Sjogrens syndrome. OA, GERD, migraines. Non-insulin-dependent diabetes mellitus type 2, Sjogren's  syndrome, carpal tunnel syndrome, gout, obesity due to excess calories, Hx of DVT 2015, OSA on CPAP, vertigo  see medical chart ? ?08-10-20 PROCEDURE: ?1. L4 laminectomy with facetectomy for decompression of exiting nerve roots, more than would be required for placement of interbody graft ?2. Placement of anterior interbody device - Medtronic expandable cage, short 41mm lordotic x2 ?3. Posterior non-segmental instrumentation using cortical pedicle screws at L4 - L5 ?4. Interbody arthrodesis, L4-5 ?5. Use of locally harvested bone autograft ?6. Use of non-structural bone allograft - Proteos ? ? ?  ?SUBJECTIVE:  The pain comes and goes. 4/10 now. The stretches help when I do them. I did them twice this morning. The left hip has been bothering me more. I have been walking a lot in the house and doing moderate household chores and lifting. I have been doing good and reaching 6,000 feet per day. I can be on my feet maybe 2 hours for light housework.                                                                                                                                              ?  ?  PAIN:  ?Are you having pain? Yes ?NPRS scale: 4/10 at present  ?Pain location: toward midline and runs into bil buttocks , more left sided today ?Pain orientation: Bilateral  ?PAIN TYPE: chronic ?Pain description:  sore at incision site but feeling tingling down legs if she is up too long   ?Aggravating factors: shopping in the store with hands on basket, I try to stay in a bent position in the store, bending over for long periods of time.  Walking for longer than 5-10 minutes and must sit down ?Relieving factors: taking medication ?  ?PRECAUTIONS: None ?  ?PATIENT GOALS I want to be able to manage my pain so I can walk  I would like to sing in my church choir and use my walking sticks ?  ?  ?OBJECTIVE:  ?  ?DIAGNOSTIC FINDINGS:  ?08-10-21 L4-L5 posterior and interbody lumbar fusion. Hardware intact. ?Anatomic alignment. ?  ?PATIENT  SURVEYS:  ?FOTO 54%  57% predicted ?  ?SCREENING FOR RED FLAGS: ?Bowel or bladder incontinence: Yes: due to medication has had xrays Pt has examimn ?Spinal tumors: No ?Cauda equina syndrome: No ?  ?  ?COGNITION: ?         Overall cognitive status: Within functional limits for tasks assessed              ?          ?SENSATION: ?         Light touch: Deficits Pt describes a " shimmying" down legs after walking for more than 5 minutes ?         Stereognosis: Appears intact ?         Hot/Cold: Appears intact ?         Proprioception: Appears intact ?  ?MUSCLE LENGTH: ?Hamstrings: WFL ?Thomas test: bil tightness in flexors of hip ?  ?POSTURE:  ?Pt with increased abdominal girth.  Ant tilt pelvic levels even ?  ?PALPATION: ?Bil lumbar tightness and TTP over lumbar incision. Slight tenderness over bil buttocks ?  ?LUMBARAROM/PROM ?  ?A/PROM A/PROM  ?07/19/2021  ?Flexion Fingertips to bil ankles  ?Extension 20 degrees  ?Right lateral flexion Finger tip to R knee jt line  ?Left lateral flexion Finger tip to 2 in above knee jt line  ?Right rotation 50%  ?Left rotation 50%  ? (Blank rows = not tested) ?  ?LE AROM/PROM: ?  ?A/PROM Right ?07/19/2021 Left ?07/19/2021  ?Hip flexion 110 110  ?Hip extension      ?Hip abduction      ?Hip adduction      ?Hip internal rotation 35 23  ?Hip external rotation 45 20  ?Knee flexion 125 130  ?Knee extension 5 5  ?Ankle dorsiflexion      ?Ankle plantarflexion      ?Ankle inversion      ?Ankle eversion      ? (Blank rows = not tested) ?  ?LE MMT: ?  ?MMT Right ?07/19/2021 Left ?07/19/2021  ?Hip flexion 4 4  ?Hip extension 4 4-  ?Hip abduction 4 4-  ?Hip adduction      ?Hip internal rotation      ?Hip external rotation      ?Knee flexion 4 4  ?Knee extension 4+ 4  ?Ankle dorsiflexion 4 4  ?Ankle plantarflexion      ?Ankle inversion      ?Ankle eversion      ? (Blank rows = not tested) ?  ?LUMBAR SPECIAL TESTS:  ?NT ?  ?  FUNCTIONAL TESTS:  ?5 times sit to stand: 15.3 ?6 minute walk test: 843 ft with  pain increase to 7/10 # standing rest breaks and 1 seated.  ?  ?GAIT: ?Distance walked: 150 ?Assistive device utilized: None ?Level of assistance: Complete Independence ?Comments: Pt cannot stand for longer than 5 mminutes without pain ?Pt tends to ambulate with wider base gait and shorter stride lengths ? ?[facility] Adult PT Treatment:                                                DATE: 08/09/21 ?Therapeutic Exercise: ?Nustep L5 x 6 minutes UE/LE ?Heel raises x 20 ?Standing hip abduction 10 x 2  ?Squats at free motion bar 10 x 2  ?Seated physioball childs pose x 5 for 10 sec  ?PPT 10 x 2 5 sec  ?Ball squeeze with ab/pelvic floor draw 5 sec x 10 ?Hooklying clam with green band 5 sec x 10 ?LTR ?Figure 4 leg straight ?Piriformis leg straight ?SKTC x 2 each ?Log roll to transfer x 2  ? ?Self Care: Reviewed walking program and issued to patient to walk daily ? ?[facility] Adult PT Treatment:                                                DATE: 07/26/21 ?Therapeutic Exercise: ?6 MWT 843 feet  (3 standing RB, 1 seated RB)  ?Seated physioball childs pose x 5 for 10 sec  ?LTR ?PPT ?Figure 4 leg straight ?Piriformis leg straight ?Log roll to transfer x 2  ? ? ?  ?AQUATIC TREATMENT 07/21/21: ?-See daily note ? ?Initial TREATMENT 07/19/21 ?Eval, POC and initial HEP ? ? ?  ?PATIENT EDUCATION:  ?Education details:  Intro to [employer], reviewed (verbally) HEP ?Person educated: Patient ?Education method: Explanation, Demonstration, Tactile cues, Verbal cues ?Education comprehension: verbalized understanding, returned demonstration, verbal cues required, and needs further education ?  ?  ?HOME EXERCISE PROGRAM: ?Access Code: [email or web address] ?URL: [email or web address] ?Date: 08/09/2021 ?Prepared by: Hessie Diener ? ?Exercises ?Supine Piriformis Stretch with Leg Straight - 1-2 x daily - 7 x weekly - 1 sets - 3 reps - 20-30 sec hold ?Supine Pelvic Tilt - 1 x daily - 7 x weekly - 3 sets - 10 reps ?Supine Single Knee to Chest Stretch - 2 x  daily - 7 x weekly - 1 sets - 5 reps - 10 hold ?Supine Lower Trunk Rotation - 2 x daily - 7 x weekly - 1 sets - 5 reps - 20 hold ?Sit to stand with sink support Movement snack - 3 x daily - 7 x weekly - 1 se

## 2021-08-12 DIAGNOSIS — M549 Dorsalgia, unspecified: Secondary | ICD-10-CM | POA: Diagnosis not present

## 2021-08-12 DIAGNOSIS — M1990 Unspecified osteoarthritis, unspecified site: Secondary | ICD-10-CM | POA: Diagnosis not present

## 2021-08-12 DIAGNOSIS — M79643 Pain in unspecified hand: Secondary | ICD-10-CM | POA: Diagnosis not present

## 2021-08-12 DIAGNOSIS — Z79899 Other long term (current) drug therapy: Secondary | ICD-10-CM | POA: Diagnosis not present

## 2021-08-12 DIAGNOSIS — M3500 Sicca syndrome, unspecified: Secondary | ICD-10-CM | POA: Diagnosis not present

## 2021-08-12 DIAGNOSIS — M25552 Pain in left hip: Secondary | ICD-10-CM | POA: Diagnosis not present

## 2021-08-15 DIAGNOSIS — M1990 Unspecified osteoarthritis, unspecified site: Secondary | ICD-10-CM | POA: Diagnosis not present

## 2021-08-15 DIAGNOSIS — M549 Dorsalgia, unspecified: Secondary | ICD-10-CM | POA: Diagnosis not present

## 2021-08-16 ENCOUNTER — Other Ambulatory Visit: Payer: Self-pay

## 2021-08-16 ENCOUNTER — Encounter: Payer: Self-pay | Admitting: Physical Therapy

## 2021-08-16 ENCOUNTER — Ambulatory Visit: Payer: Medicare Other | Admitting: Physical Therapy

## 2021-08-16 DIAGNOSIS — M25652 Stiffness of left hip, not elsewhere classified: Secondary | ICD-10-CM | POA: Diagnosis not present

## 2021-08-16 DIAGNOSIS — M5440 Lumbago with sciatica, unspecified side: Secondary | ICD-10-CM

## 2021-08-16 DIAGNOSIS — M6281 Muscle weakness (generalized): Secondary | ICD-10-CM

## 2021-08-16 NOTE — Therapy (Signed)
?OUTPATIENT PHYSICAL THERAPY TREATMENT NOTE ? ? ?Patient Name: Rebecca Hahn ?MRN: [id number] ?DOB:02-26-1956, 67 y.o., female ?Today's Date: 08/16/2021 ? ?PCP: Georgianne Fick, MD ?REFERRING PROVIDER: Georgianne Fick, MD ? ? PT End of Session - 08/16/21 0732   ? ? Visit Number 5   ? Number of Visits 12   ? Date for PT Re-Evaluation 09/13/21   ? Authorization Type BCBS MCR  6th visit FOTO  10th visit FOTO   ? PT Start Time 0730   ? PT Stop Time 0800   ? PT Time Calculation (min) 30 min   ? ?  ?  ? ?  ? ? ?Past Medical History:  ?Diagnosis Date  ? Agenesis of gallbladder   ? Alopecia   ? Anemia   ? Anxiety   ? Cognitive dysfunction   ? Colon polyp   ? DDD (degenerative disc disease), lumbar   ? Depression   ? Diabetes mellitus, type II ([facility])   ? Gastric polyp   ? GERD (gastroesophageal reflux disease)   ? Glaucoma   ? Gout   ? Headache   ? migraines  ? Hearing loss   ? Hyperlipidemia   ? Hypertension   ? Lumbar radiculopathy   ? Lumbar radiculopathy   ? Morbid obesity ([facility])   ? Osteoarthritis   ? Sjoegren syndrome   ? Sleep apnea   ? CPAP  ? Vertigo   ? ?Past Surgical History:  ?Procedure Laterality Date  ? BACK SURGERY    ? CESAREAN SECTION    ? CHOLECYSTECTOMY    ? TUBAL LIGATION    ? ?Patient Active Problem List  ? Diagnosis Date Noted  ? Precordial pain   ? Lumbar spinal stenosis 08/10/2020  ? ?REFERRING PROVIDER: Lisbeth Renshaw, MD ?  ?REFERRING DIAG: X91.478 (ICD-10-CM) - Spinal stenosis, lumbar region with neurogenic claudication ?  ?THERAPY DIAG:  ?Bilateral low back pain with sciatica, sciatica laterality unspecified, unspecified chronicity ?  ?Muscle weakness (generalized) ?  ?Stiffness of left hip, not elsewhere classified ?  ?ONSET DATE: PLIF 08-10-20 surgery ? ?PERTINENT HISTORY:  ?Back surgery x 2, C section, cholecystectomy, tubal ligation, DDD, spinal stenosis, Sjogrens syndrome. OA, GERD, migraines. Non-insulin-dependent diabetes mellitus type 2, Sjogren's syndrome, carpal tunnel syndrome,  gout, obesity due to excess calories, Hx of DVT 2015, OSA on CPAP, vertigo  see medical chart ? ?08-10-20 PROCEDURE: ?1. L4 laminectomy with facetectomy for decompression of exiting nerve roots, more than would be required for placement of interbody graft ?2. Placement of anterior interbody device - Medtronic expandable cage, short 53mm lordotic x2 ?3. Posterior non-segmental instrumentation using cortical pedicle screws at L4 - L5 ?4. Interbody arthrodesis, L4-5 ?5. Use of locally harvested bone autograft ?6. Use of non-structural bone allograft - Proteos ? ? ?  ?SUBJECTIVE:  I got a shot in my hip for bursitis so the pain is reduced. I am do a lot of exercises when I am sitting and I am keeping moving with housework. I got 10,000 steps one day. No pain right now.                                                                                                                                           ?  ?  PAIN:  ?Are you having pain? No ?NPRS scale: 0/10 at present , up to 3/10 ?Pain location: toward midline and runs into bil buttocks , more left sided today ?Pain orientation: Bilateral  ?PAIN TYPE: chronic, intermittent ?Pain description:  sore at incision site but feeling tingling down legs if she is up too long   ?Aggravating factors: shopping in the store with hands on basket, I try to stay in a bent position in the store, bending over for long periods of time.  Walking for longer than 5-10 minutes and must sit down ?Relieving factors: taking medication, stretching in the morning ?  ?PRECAUTIONS: None ?  ?PATIENT GOALS I want to be able to manage my pain so I can walk  I would like to sing in my church choir and use my walking sticks ?  ?  ?OBJECTIVE:  ?  ?DIAGNOSTIC FINDINGS:  ?08-10-21 L4-L5 posterior and interbody lumbar fusion. Hardware intact. ?Anatomic alignment. ?  ?PATIENT SURVEYS:  ?FOTO 54%  57% predicted ?  ?SCREENING FOR RED FLAGS: ?Bowel or bladder incontinence: Yes: due to medication has had xrays Pt  has examimn ?Spinal tumors: No ?Cauda equina syndrome: No ?  ?  ?COGNITION: ?         Overall cognitive status: Within functional limits for tasks assessed              ?          ?SENSATION: ?         Light touch: Deficits Pt describes a " shimmying" down legs after walking for more than 5 minutes ?         Stereognosis: Appears intact ?         Hot/Cold: Appears intact ?         Proprioception: Appears intact ?  ?MUSCLE LENGTH: ?Hamstrings: WFL ?Thomas test: bil tightness in flexors of hip ?  ?POSTURE:  ?Pt with increased abdominal girth.  Ant tilt pelvic levels even ?  ?PALPATION: ?Bil lumbar tightness and TTP over lumbar incision. Slight tenderness over bil buttocks ?  ?LUMBARAROM/PROM ?  ?A/PROM A/PROM  ?07/19/2021  ?Flexion Fingertips to bil ankles  ?Extension 20 degrees  ?Right lateral flexion Finger tip to R knee jt line  ?Left lateral flexion Finger tip to 2 in above knee jt line  ?Right rotation 50%  ?Left rotation 50%  ? (Blank rows = not tested) ?  ?LE AROM/PROM: ?  ?A/PROM Right ?07/19/2021 Left ?07/19/2021  ?Hip flexion 110 110  ?Hip extension      ?Hip abduction      ?Hip adduction      ?Hip internal rotation 35 23  ?Hip external rotation 45 20  ?Knee flexion 125 130  ?Knee extension 5 5  ?Ankle dorsiflexion      ?Ankle plantarflexion      ?Ankle inversion      ?Ankle eversion      ? (Blank rows = not tested) ?  ?LE MMT: ?  ?MMT Right ?07/19/2021 Left ?07/19/2021  ?Hip flexion 4 4  ?Hip extension 4 4-  ?Hip abduction 4 4-  ?Hip adduction      ?Hip internal rotation      ?Hip external rotation      ?Knee flexion 4 4  ?Knee extension 4+ 4  ?Ankle dorsiflexion 4 4  ?Ankle plantarflexion      ?Ankle inversion      ?Ankle eversion      ? (Blank rows = not tested) ?  ?  LUMBAR SPECIAL TESTS:  ?NT ?  ?FUNCTIONAL TESTS:  ?5 times sit to stand: 15.3 ?6 minute walk test: 843 ft with pain increase to 7/10 # standing rest breaks and 1 seated.  ?  ?GAIT: ?Distance walked: 150 ?Assistive device utilized: None ?Level of  assistance: Complete Independence ?Comments: Pt cannot stand for longer than 5 mminutes without pain ?Pt tends to ambulate with wider base gait and shorter stride lengths ? ?[facility] Adult PT Treatment:                                                DATE: 08/16/21 ?Therapeutic Exercise: ?Nustep L6 x 5 minutes LE only  ?STS x 5  11 sec ?Hip hinge training with wooden dowel for squat and deadlift ?5# dead lift x 10 from floor ?5#  goblet squat to chair tap x 10 ?Seated physioball childs pose x 5 for 10 sec  ? ? ?[facility] Adult PT Treatment:                                                DATE: 08/09/21 ?Therapeutic Exercise: ?Nustep L5 x 6 minutes UE/LE ?Heel raises x 20 ?Standing hip abduction 10 x 2  ?Squats at free motion bar 10 x 2  ?Seated physioball childs pose x 5 for 10 sec  ?PPT 10 x 2 5 sec  ?Ball squeeze with ab/pelvic floor draw 5 sec x 10 ?Hooklying clam with green band 5 sec x 10 ?LTR ?Figure 4 leg straight ?Piriformis leg straight ?SKTC x 2 each ?Log roll to transfer x 2  ? ?Self Care: Reviewed walking program and issued to patient to walk daily ? ?[facility] Adult PT Treatment:                                                DATE: 07/26/21 ?Therapeutic Exercise: ?6 MWT 843 feet  (3 standing RB, 1 seated RB)  ?Seated physioball childs pose x 5 for 10 sec  ?LTR ?PPT ?Figure 4 leg straight ?Piriformis leg straight ?Log roll to transfer x 2  ? ? ?  ?AQUATIC TREATMENT 07/21/21: ?-See daily note ? ?Initial TREATMENT 07/19/21 ?Eval, POC and initial HEP ? ? ?  ?PATIENT EDUCATION:  ?Education details:  Intro to [employer], reviewed (verbally) HEP ?Person educated: Patient ?Education method: Explanation, Demonstration, Tactile cues, Verbal cues ?Education comprehension: verbalized understanding, returned demonstration, verbal cues required, and needs further education ?  ?  ?HOME EXERCISE PROGRAM: ?Access Code: [email or web address] ?URL: [email or web address] ?Date: 08/09/2021 ?Prepared by: Jannette Spanner ? ?Exercises ?Supine  Piriformis Stretch with Leg Straight - 1-2 x daily - 7 x weekly - 1 sets - 3 reps - 20-30 sec hold ?Supine Pelvic Tilt - 1 x daily - 7 x weekly - 3 sets - 10 reps ?Supine Single Knee to Chest Stretch - 2 x dail

## 2021-08-23 ENCOUNTER — Ambulatory Visit: Payer: Medicare Other | Admitting: Physical Therapy

## 2021-08-26 ENCOUNTER — Ambulatory Visit (HOSPITAL_BASED_OUTPATIENT_CLINIC_OR_DEPARTMENT_OTHER): Payer: Self-pay | Admitting: Physical Therapy

## 2021-09-02 ENCOUNTER — Ambulatory Visit (HOSPITAL_BASED_OUTPATIENT_CLINIC_OR_DEPARTMENT_OTHER): Payer: Self-pay | Admitting: Physical Therapy

## 2021-09-06 ENCOUNTER — Encounter: Payer: Self-pay | Admitting: Physical Therapy

## 2021-09-06 ENCOUNTER — Ambulatory Visit: Payer: Medicare Other | Attending: Neurosurgery | Admitting: Physical Therapy

## 2021-09-06 DIAGNOSIS — M25652 Stiffness of left hip, not elsewhere classified: Secondary | ICD-10-CM | POA: Insufficient documentation

## 2021-09-06 DIAGNOSIS — M5440 Lumbago with sciatica, unspecified side: Secondary | ICD-10-CM | POA: Diagnosis not present

## 2021-09-06 DIAGNOSIS — M6281 Muscle weakness (generalized): Secondary | ICD-10-CM | POA: Diagnosis not present

## 2021-09-06 NOTE — Therapy (Addendum)
?OUTPATIENT PHYSICAL THERAPY TREATMENT NOTE/DISCHARGE NOTE ? ? ?PHYSICAL THERAPY DISCHARGE SUMMARY ? ?Visits from Start of Care: 6 ? ?Current functional level related to goals / functional outcomes: ?As indicated below ?  ?Remaining deficits: ?Met most of LTG except for 6 MWT but improved from eval ?  ?Education / Equipment: ?HEP  ? ?Patient agrees to discharge. Patient goals were  mostly met except for 6 MWT improved from eval . Patient is being discharged due to being pleased with the current functional level.  ?Patient Name: Rebecca Hahn ?MRN: [id number] ?DOB:1955/06/15, 66 y.o., female ?Today's Date: 09/06/2021 ? ?PCP: [name], MD ?REFERRING PROVIDER: [name], MD ? ? PT End of Session - 09/06/21 0725   ? ? Visit Number 6   ? Number of Visits 12   ? Date for PT Re-Evaluation 09/13/21   ? Authorization Type [employer] MCR  6th visit FOTO  10th visit FOTO   ? Authorization - Visit Number 4   ? Progress Note Due on Visit 10   ? PT Start Time [phone]   ? PT Stop Time 0805   ? PT Time Calculation (min) 44 min   ? ?  ?  ? ?  ? ? ?Past Medical History:  ?Diagnosis Date  ? Agenesis of gallbladder   ? Alopecia   ? Anemia   ? Anxiety   ? Cognitive dysfunction   ? Colon polyp   ? DDD (degenerative disc disease), lumbar   ? Depression   ? Diabetes mellitus, type II ([place])   ? Gastric polyp   ? GERD (gastroesophageal reflux disease)   ? Glaucoma   ? Gout   ? Headache   ? migraines  ? Hearing loss   ? Hyperlipidemia   ? Hypertension   ? Lumbar radiculopathy   ? Lumbar radiculopathy   ? Morbid obesity ([place])   ? Osteoarthritis   ? Sjoegren syndrome   ? Sleep apnea   ? CPAP  ? Vertigo   ? ?Past Surgical History:  ?Procedure Laterality Date  ? BACK SURGERY    ? CESAREAN SECTION    ? CHOLECYSTECTOMY    ? TUBAL LIGATION    ? ?Patient Active Problem List  ? Diagnosis Date Noted  ? Precordial pain   ? Lumbar spinal stenosis 08/10/2020  ? ?REFERRING PROVIDER: Consuella Lose, MD ?  ?REFERRING DIAG: Q76.195 (ICD-10-CM)  - Spinal stenosis, lumbar region with neurogenic claudication ?  ?THERAPY DIAG:  ?Bilateral low back pain with sciatica, sciatica laterality unspecified, unspecified chronicity ?  ?Muscle weakness (generalized) ?  ?Stiffness of left hip, not elsewhere classified ?  ?ONSET DATE: PLIF 08-10-20 surgery ? ?PERTINENT HISTORY:  ?Back surgery x 2, C section, cholecystectomy, tubal ligation, DDD, spinal stenosis, Sjogrens syndrome. OA, GERD, migraines. Non-insulin-dependent diabetes mellitus type 2, Sjogren's syndrome, carpal tunnel syndrome, gout, obesity due to excess calories, Hx of DVT 2015, OSA on CPAP, vertigo  see medical chart ? ?08-10-20 PROCEDURE: ?1. L4 laminectomy with facetectomy for decompression of exiting nerve roots, more than would be required for placement of interbody graft ?2. Placement of anterior interbody device - Medtronic expandable cage, short 56m lordotic x2 ?3. Posterior non-segmental instrumentation using cortical pedicle screws at L4 - L5 ?4. Interbody arthrodesis, L4-5 ?5. Use of locally harvested bone autograft ?6. Use of non-structural bone allograft - Proteos ? ? ?  ?SUBJECTIVE:  If I bend too much without good mechanics, I feel the most pain, up to 5/10. I can go 30-45 minutes without  sitting if I do not do a lot of bending. I feel pretty good. I will continue to work on the exercises and walking. This is my last day.                                                                                                                                           ?  ?PAIN:  ?Are you having pain? No ?NPRS scale: 0/10 at present , up to 5/10 ?Pain location: toward midline and runs into bil buttocks , more left sided today ?Pain orientation: Bilateral  ?PAIN TYPE: chronic, intermittent ?Pain description:  sore at incision site but feeling tingling down legs if she is up too long   ?Aggravating factors: shopping in the store with hands on basket, I try to stay in a bent position in the store, bending  over for long periods of time.  Walking for longer than 5-10 minutes and must sit down ?Relieving factors: taking medication, stretching in the morning ?  ?PRECAUTIONS: None ?  ?PATIENT GOALS I want to be able to manage my pain so I can walk  I would like to sing in my church choir and use my walking sticks ?  ?  ?OBJECTIVE:  ?  ?DIAGNOSTIC FINDINGS:  ?08-10-21 L4-L5 posterior and interbody lumbar fusion. Hardware intact. ?Anatomic alignment. ?  ?PATIENT SURVEYS:  ?FOTO 54%  57% predicted, status 75% 09/06/21 ?  ?SCREENING FOR RED FLAGS: ?Bowel or bladder incontinence: Yes: due to medication has had xrays Pt has examimn ?Spinal tumors: No ?Cauda equina syndrome: No ?  ?  ?COGNITION: ?         Overall cognitive status: Within functional limits for tasks assessed              ?          ?SENSATION: ?         Light touch: Deficits Pt describes a " shimmying" down legs after walking for more than 5 minutes ?         Stereognosis: Appears intact ?         Hot/Cold: Appears intact ?         Proprioception: Appears intact ?  ?MUSCLE LENGTH: ?Hamstrings: WFL ?Thomas test: bil tightness in flexors of hip ?  ?POSTURE:  ?Pt with increased abdominal girth.  Ant tilt pelvic levels even ?  ?PALPATION: ?Bil lumbar tightness and TTP over lumbar incision. Slight tenderness over bil buttocks ?  ?LUMBARAROM/PROM ?  ?A/PROM A/PROM  ?07/19/2021 09/06/21  ?Flexion Fingertips to bil ankles Fingertips to toes bilat  ?Extension 20 degrees   ?Right lateral flexion Finger tip to R knee jt line Finger to 1 inch lower than knee joint line  ?Left lateral flexion Finger tip to 2 in above knee jt line Finger tip to 1 inch above joint line  ?Right rotation 50% 75%  ?Left  rotation 50% 75%  ? (Blank rows = not tested) ?  ?LE AROM/PROM: ?  ?A/PROM Right ?07/19/2021 Left ?07/19/2021  ?Hip flexion 110 110  ?Hip extension      ?Hip abduction      ?Hip adduction      ?Hip internal rotation 35 23  ?Hip external rotation 45 20  ?Knee flexion 125 130  ?Knee  extension 5 5  ?Ankle dorsiflexion      ?Ankle plantarflexion      ?Ankle inversion      ?Ankle eversion      ? (Blank rows = not tested) ?  ?LE MMT: ?  ?MMT Right ?07/19/2021 Left ?07/19/2021 Right /Left ?09/06/21  ?Hip flexion 4 4 4/5 bilat   ?Hip extension 4 4-   ?Hip abduction 4 4- 4-/5 Left  ?Hip adduction       ?Hip internal rotation       ?Hip external rotation       ?Knee flexion 4 4   ?Knee extension 4+ 4   ?Ankle dorsiflexion 4 4   ?Ankle plantarflexion       ?Ankle inversion       ?Ankle eversion       ? (Blank rows = not tested) ?  ?LUMBAR SPECIAL TESTS:  ?NT ?  ?FUNCTIONAL TESTS:  ?5 times sit to stand: 15.3   , 11 sec 08/16/21 ?6 minute walk test: 843 ft with pain increase to 7/10 3 standing rest breaks and 1 seated. 09/06/21 1067 feet- 3 standing rest breaks ?  ?GAIT: ?Distance walked: 150 ?Assistive device utilized: None ?Level of assistance: Complete Independence ?Comments: Pt cannot stand for longer than 5 mminutes without pain ?Pt tends to ambulate with wider base gait and shorter stride lengths ? ?[facility] Adult PT Treatment:                                                DATE: 09/06/21 ?Therapeutic Exercise: ?Nustep L6 x 5 minutes LE only  ?6MWT ?20# lift and carry 100 feet  ?AROM lumbar ?MMT Hips  ?10 x 2 hip abduction left and right ?SLR 10 x 2 each with ab brace ?Bridge x1 0 ? ? ?[facility] Adult PT Treatment:                                                DATE: 08/16/21 ?Therapeutic Exercise: ?Nustep L6 x 5 minutes LE only  ?STS x 5  11 sec ?Hip hinge training with wooden dowel for squat and deadlift ?5# dead lift x 10 from floor ?5#  goblet squat to chair tap x 10 ?Seated physioball childs pose x 5 for 10 sec  ? ? ?[facility] Adult PT Treatment:                                                DATE: 08/09/21 ?Therapeutic Exercise: ?Nustep L5 x 6 minutes UE/LE ?Heel raises x 20 ?Standing hip abduction 10 x 2  ?Squats at free motion bar 10 x 2  ?Seated physioball childs pose x 5 for 10 sec  ?PPT 10  x 2 5 sec  ?Ball  squeeze with ab/pelvic floor draw 5 sec x 10 ?Hooklying clam with green band 5 sec x 10 ?LTR ?Figure 4 leg straight ?Piriformis leg straight ?SKTC x 2 each ?Log roll to transfer x 2  ? ?Self Care: Reviewed walk

## 2021-09-07 DIAGNOSIS — G4733 Obstructive sleep apnea (adult) (pediatric): Secondary | ICD-10-CM | POA: Diagnosis not present

## 2021-09-07 DIAGNOSIS — E669 Obesity, unspecified: Secondary | ICD-10-CM | POA: Diagnosis not present

## 2021-09-07 DIAGNOSIS — Z6839 Body mass index (BMI) 39.0-39.9, adult: Secondary | ICD-10-CM | POA: Diagnosis not present

## 2021-09-09 ENCOUNTER — Ambulatory Visit (HOSPITAL_BASED_OUTPATIENT_CLINIC_OR_DEPARTMENT_OTHER): Payer: Self-pay | Admitting: Physical Therapy

## 2021-09-13 ENCOUNTER — Encounter: Payer: Federal, State, Local not specified - PPO | Admitting: Physical Therapy

## 2021-09-15 DIAGNOSIS — M3500 Sicca syndrome, unspecified: Secondary | ICD-10-CM | POA: Diagnosis not present

## 2021-09-15 DIAGNOSIS — M1990 Unspecified osteoarthritis, unspecified site: Secondary | ICD-10-CM | POA: Diagnosis not present

## 2021-09-15 DIAGNOSIS — Z79899 Other long term (current) drug therapy: Secondary | ICD-10-CM | POA: Diagnosis not present

## 2021-09-15 DIAGNOSIS — M79643 Pain in unspecified hand: Secondary | ICD-10-CM | POA: Diagnosis not present

## 2021-09-28 DIAGNOSIS — G4733 Obstructive sleep apnea (adult) (pediatric): Secondary | ICD-10-CM | POA: Diagnosis not present

## 2021-11-08 DIAGNOSIS — M3501 Sicca syndrome with keratoconjunctivitis: Secondary | ICD-10-CM | POA: Diagnosis not present

## 2021-11-08 DIAGNOSIS — L819 Disorder of pigmentation, unspecified: Secondary | ICD-10-CM | POA: Diagnosis not present

## 2021-11-08 DIAGNOSIS — M1990 Unspecified osteoarthritis, unspecified site: Secondary | ICD-10-CM | POA: Diagnosis not present

## 2021-11-08 DIAGNOSIS — H04123 Dry eye syndrome of bilateral lacrimal glands: Secondary | ICD-10-CM | POA: Diagnosis not present

## 2021-11-16 DIAGNOSIS — H259 Unspecified age-related cataract: Secondary | ICD-10-CM | POA: Diagnosis not present

## 2021-11-16 DIAGNOSIS — Z01818 Encounter for other preprocedural examination: Secondary | ICD-10-CM | POA: Diagnosis not present

## 2021-11-16 DIAGNOSIS — G4733 Obstructive sleep apnea (adult) (pediatric): Secondary | ICD-10-CM | POA: Diagnosis not present

## 2021-11-28 ENCOUNTER — Other Ambulatory Visit: Payer: Self-pay

## 2021-11-28 DIAGNOSIS — Z1231 Encounter for screening mammogram for malignant neoplasm of breast: Secondary | ICD-10-CM

## 2021-12-01 ENCOUNTER — Ambulatory Visit
Admission: RE | Admit: 2021-12-01 | Discharge: 2021-12-01 | Disposition: A | Discharge status: Home / Self Care | Payer: Federal, State, Local not specified - PPO | Source: Ambulatory Visit | Attending: *Deleted | Admitting: *Deleted

## 2021-12-01 DIAGNOSIS — Z1231 Encounter for screening mammogram for malignant neoplasm of breast: Secondary | ICD-10-CM

## 2021-12-15 DIAGNOSIS — H16223 Keratoconjunctivitis sicca, not specified as Sjogren's, bilateral: Secondary | ICD-10-CM | POA: Diagnosis not present

## 2021-12-15 DIAGNOSIS — H25813 Combined forms of age-related cataract, bilateral: Secondary | ICD-10-CM | POA: Diagnosis not present

## 2021-12-15 DIAGNOSIS — H04123 Dry eye syndrome of bilateral lacrimal glands: Secondary | ICD-10-CM | POA: Diagnosis not present

## 2021-12-15 DIAGNOSIS — I10 Essential (primary) hypertension: Secondary | ICD-10-CM | POA: Diagnosis not present

## 2021-12-15 DIAGNOSIS — H02884 Meibomian gland dysfunction left upper eyelid: Secondary | ICD-10-CM | POA: Diagnosis not present

## 2021-12-15 DIAGNOSIS — H02885 Meibomian gland dysfunction left lower eyelid: Secondary | ICD-10-CM | POA: Diagnosis not present

## 2021-12-15 DIAGNOSIS — E119 Type 2 diabetes mellitus without complications: Secondary | ICD-10-CM | POA: Diagnosis not present

## 2021-12-15 DIAGNOSIS — G4730 Sleep apnea, unspecified: Secondary | ICD-10-CM | POA: Diagnosis not present

## 2021-12-19 DIAGNOSIS — M79643 Pain in unspecified hand: Secondary | ICD-10-CM | POA: Diagnosis not present

## 2021-12-19 DIAGNOSIS — M1990 Unspecified osteoarthritis, unspecified site: Secondary | ICD-10-CM | POA: Diagnosis not present

## 2021-12-19 DIAGNOSIS — M3500 Sicca syndrome, unspecified: Secondary | ICD-10-CM | POA: Diagnosis not present

## 2021-12-19 DIAGNOSIS — Z79899 Other long term (current) drug therapy: Secondary | ICD-10-CM | POA: Diagnosis not present

## 2021-12-21 DIAGNOSIS — G4733 Obstructive sleep apnea (adult) (pediatric): Secondary | ICD-10-CM | POA: Diagnosis not present

## 2021-12-22 DIAGNOSIS — G4733 Obstructive sleep apnea (adult) (pediatric): Secondary | ICD-10-CM | POA: Diagnosis not present

## 2022-01-11 DIAGNOSIS — H25813 Combined forms of age-related cataract, bilateral: Secondary | ICD-10-CM | POA: Diagnosis not present

## 2022-01-11 DIAGNOSIS — Z7984 Long term (current) use of oral hypoglycemic drugs: Secondary | ICD-10-CM | POA: Diagnosis not present

## 2022-01-11 DIAGNOSIS — E1136 Type 2 diabetes mellitus with diabetic cataract: Secondary | ICD-10-CM | POA: Diagnosis not present

## 2022-01-12 DIAGNOSIS — M109 Gout, unspecified: Secondary | ICD-10-CM | POA: Diagnosis not present

## 2022-01-12 DIAGNOSIS — M3500 Sicca syndrome, unspecified: Secondary | ICD-10-CM | POA: Diagnosis not present

## 2022-01-12 DIAGNOSIS — I10 Essential (primary) hypertension: Secondary | ICD-10-CM | POA: Diagnosis not present

## 2022-01-12 DIAGNOSIS — E119 Type 2 diabetes mellitus without complications: Secondary | ICD-10-CM | POA: Diagnosis not present

## 2022-01-24 ENCOUNTER — Encounter (HOSPITAL_BASED_OUTPATIENT_CLINIC_OR_DEPARTMENT_OTHER): Payer: Federal, State, Local not specified - PPO | Admitting: Advanced Practice Midwife

## 2022-01-25 DIAGNOSIS — H25812 Combined forms of age-related cataract, left eye: Secondary | ICD-10-CM | POA: Diagnosis not present

## 2022-01-25 DIAGNOSIS — Z961 Presence of intraocular lens: Secondary | ICD-10-CM | POA: Diagnosis not present

## 2022-01-25 DIAGNOSIS — H278 Other specified disorders of lens: Secondary | ICD-10-CM | POA: Diagnosis not present

## 2022-01-25 DIAGNOSIS — Z9841 Cataract extraction status, right eye: Secondary | ICD-10-CM | POA: Diagnosis not present

## 2022-01-30 DIAGNOSIS — M3501 Sicca syndrome with keratoconjunctivitis: Secondary | ICD-10-CM | POA: Diagnosis not present

## 2022-01-30 DIAGNOSIS — N898 Other specified noninflammatory disorders of vagina: Secondary | ICD-10-CM | POA: Diagnosis not present

## 2022-01-30 DIAGNOSIS — N182 Chronic kidney disease, stage 2 (mild): Secondary | ICD-10-CM | POA: Diagnosis not present

## 2022-01-30 DIAGNOSIS — R350 Frequency of micturition: Secondary | ICD-10-CM | POA: Diagnosis not present

## 2022-01-30 DIAGNOSIS — E1169 Type 2 diabetes mellitus with other specified complication: Secondary | ICD-10-CM | POA: Diagnosis not present

## 2022-01-30 DIAGNOSIS — E782 Mixed hyperlipidemia: Secondary | ICD-10-CM | POA: Diagnosis not present

## 2022-02-09 DIAGNOSIS — Z961 Presence of intraocular lens: Secondary | ICD-10-CM | POA: Diagnosis not present

## 2022-02-09 DIAGNOSIS — Z48810 Encounter for surgical aftercare following surgery on the sense organs: Secondary | ICD-10-CM | POA: Diagnosis not present

## 2022-02-09 DIAGNOSIS — Z9842 Cataract extraction status, left eye: Secondary | ICD-10-CM | POA: Diagnosis not present

## 2022-02-09 DIAGNOSIS — Z9841 Cataract extraction status, right eye: Secondary | ICD-10-CM | POA: Diagnosis not present

## 2022-02-17 DIAGNOSIS — Z124 Encounter for screening for malignant neoplasm of cervix: Secondary | ICD-10-CM | POA: Diagnosis not present

## 2022-02-17 DIAGNOSIS — Z1151 Encounter for screening for human papillomavirus (HPV): Secondary | ICD-10-CM | POA: Diagnosis not present

## 2022-02-23 DIAGNOSIS — E1169 Type 2 diabetes mellitus with other specified complication: Secondary | ICD-10-CM | POA: Diagnosis not present

## 2022-02-23 DIAGNOSIS — M3501 Sicca syndrome with keratoconjunctivitis: Secondary | ICD-10-CM | POA: Diagnosis not present

## 2022-02-23 DIAGNOSIS — M3500 Sicca syndrome, unspecified: Secondary | ICD-10-CM | POA: Diagnosis not present

## 2022-03-28 DIAGNOSIS — M3500 Sicca syndrome, unspecified: Secondary | ICD-10-CM | POA: Diagnosis not present

## 2022-03-28 DIAGNOSIS — Z79899 Other long term (current) drug therapy: Secondary | ICD-10-CM | POA: Diagnosis not present

## 2022-03-28 DIAGNOSIS — M1990 Unspecified osteoarthritis, unspecified site: Secondary | ICD-10-CM | POA: Diagnosis not present

## 2022-03-28 DIAGNOSIS — M25552 Pain in left hip: Secondary | ICD-10-CM | POA: Diagnosis not present

## 2022-03-28 DIAGNOSIS — M79643 Pain in unspecified hand: Secondary | ICD-10-CM | POA: Diagnosis not present

## 2022-03-28 DIAGNOSIS — M7062 Trochanteric bursitis, left hip: Secondary | ICD-10-CM | POA: Diagnosis not present

## 2022-03-30 DIAGNOSIS — Z9841 Cataract extraction status, right eye: Secondary | ICD-10-CM | POA: Diagnosis not present

## 2022-03-30 DIAGNOSIS — Z9842 Cataract extraction status, left eye: Secondary | ICD-10-CM | POA: Diagnosis not present

## 2022-03-30 DIAGNOSIS — Z48810 Encounter for surgical aftercare following surgery on the sense organs: Secondary | ICD-10-CM | POA: Diagnosis not present

## 2022-03-30 DIAGNOSIS — Z961 Presence of intraocular lens: Secondary | ICD-10-CM | POA: Diagnosis not present

## 2022-04-18 DIAGNOSIS — Z9842 Cataract extraction status, left eye: Secondary | ICD-10-CM | POA: Diagnosis not present

## 2022-04-18 DIAGNOSIS — H53149 Visual discomfort, unspecified: Secondary | ICD-10-CM | POA: Diagnosis not present

## 2022-04-18 DIAGNOSIS — Z9841 Cataract extraction status, right eye: Secondary | ICD-10-CM | POA: Diagnosis not present

## 2022-04-18 DIAGNOSIS — Z961 Presence of intraocular lens: Secondary | ICD-10-CM | POA: Diagnosis not present

## 2022-05-23 DIAGNOSIS — L659 Nonscarring hair loss, unspecified: Secondary | ICD-10-CM | POA: Diagnosis not present

## 2022-05-23 DIAGNOSIS — N760 Acute vaginitis: Secondary | ICD-10-CM | POA: Diagnosis not present

## 2022-05-23 DIAGNOSIS — I10 Essential (primary) hypertension: Secondary | ICD-10-CM | POA: Diagnosis not present

## 2022-05-23 DIAGNOSIS — L309 Dermatitis, unspecified: Secondary | ICD-10-CM | POA: Diagnosis not present

## 2022-06-27 DIAGNOSIS — Z79899 Other long term (current) drug therapy: Secondary | ICD-10-CM | POA: Diagnosis not present

## 2022-06-27 DIAGNOSIS — M1990 Unspecified osteoarthritis, unspecified site: Secondary | ICD-10-CM | POA: Diagnosis not present

## 2022-06-27 DIAGNOSIS — M79643 Pain in unspecified hand: Secondary | ICD-10-CM | POA: Diagnosis not present

## 2022-06-27 DIAGNOSIS — M3500 Sicca syndrome, unspecified: Secondary | ICD-10-CM | POA: Diagnosis not present

## 2022-06-27 DIAGNOSIS — M0609 Rheumatoid arthritis without rheumatoid factor, multiple sites: Secondary | ICD-10-CM | POA: Diagnosis not present

## 2022-06-29 DIAGNOSIS — G4730 Sleep apnea, unspecified: Secondary | ICD-10-CM | POA: Diagnosis not present

## 2022-07-17 DIAGNOSIS — L710 Perioral dermatitis: Secondary | ICD-10-CM | POA: Diagnosis not present

## 2022-08-15 DIAGNOSIS — L710 Perioral dermatitis: Secondary | ICD-10-CM | POA: Diagnosis not present

## 2022-08-16 DIAGNOSIS — G8929 Other chronic pain: Secondary | ICD-10-CM | POA: Diagnosis not present

## 2022-08-16 DIAGNOSIS — N760 Acute vaginitis: Secondary | ICD-10-CM | POA: Diagnosis not present

## 2022-08-16 DIAGNOSIS — Z13820 Encounter for screening for osteoporosis: Secondary | ICD-10-CM | POA: Diagnosis not present

## 2022-08-16 DIAGNOSIS — M25512 Pain in left shoulder: Secondary | ICD-10-CM | POA: Diagnosis not present

## 2022-09-04 DIAGNOSIS — N393 Stress incontinence (female) (male): Secondary | ICD-10-CM | POA: Diagnosis not present

## 2022-09-04 DIAGNOSIS — L280 Lichen simplex chronicus: Secondary | ICD-10-CM | POA: Diagnosis not present

## 2022-09-04 DIAGNOSIS — N763 Subacute and chronic vulvitis: Secondary | ICD-10-CM | POA: Diagnosis not present

## 2022-09-04 DIAGNOSIS — R309 Painful micturition, unspecified: Secondary | ICD-10-CM | POA: Diagnosis not present

## 2022-09-21 DIAGNOSIS — E782 Mixed hyperlipidemia: Secondary | ICD-10-CM | POA: Diagnosis not present

## 2022-09-21 DIAGNOSIS — E1165 Type 2 diabetes mellitus with hyperglycemia: Secondary | ICD-10-CM | POA: Diagnosis not present

## 2022-09-21 DIAGNOSIS — I10 Essential (primary) hypertension: Secondary | ICD-10-CM | POA: Diagnosis not present

## 2022-09-21 DIAGNOSIS — R5383 Other fatigue: Secondary | ICD-10-CM | POA: Diagnosis not present

## 2022-09-26 DIAGNOSIS — M79643 Pain in unspecified hand: Secondary | ICD-10-CM | POA: Diagnosis not present

## 2022-09-26 DIAGNOSIS — M0609 Rheumatoid arthritis without rheumatoid factor, multiple sites: Secondary | ICD-10-CM | POA: Diagnosis not present

## 2022-09-26 DIAGNOSIS — M1990 Unspecified osteoarthritis, unspecified site: Secondary | ICD-10-CM | POA: Diagnosis not present

## 2022-09-26 DIAGNOSIS — Z79899 Other long term (current) drug therapy: Secondary | ICD-10-CM | POA: Diagnosis not present

## 2022-09-26 DIAGNOSIS — M3500 Sicca syndrome, unspecified: Secondary | ICD-10-CM | POA: Diagnosis not present

## 2022-09-28 DIAGNOSIS — E1169 Type 2 diabetes mellitus with other specified complication: Secondary | ICD-10-CM | POA: Diagnosis not present

## 2022-09-28 DIAGNOSIS — M3501 Sicca syndrome with keratoconjunctivitis: Secondary | ICD-10-CM | POA: Diagnosis not present

## 2022-09-28 DIAGNOSIS — Z0000 Encounter for general adult medical examination without abnormal findings: Secondary | ICD-10-CM | POA: Diagnosis not present

## 2022-09-28 DIAGNOSIS — M3500 Sicca syndrome, unspecified: Secondary | ICD-10-CM | POA: Diagnosis not present

## 2022-11-27 DIAGNOSIS — M48061 Spinal stenosis, lumbar region without neurogenic claudication: Secondary | ICD-10-CM | POA: Diagnosis not present

## 2022-11-27 DIAGNOSIS — Z981 Arthrodesis status: Secondary | ICD-10-CM | POA: Diagnosis not present

## 2022-11-27 DIAGNOSIS — G43909 Migraine, unspecified, not intractable, without status migrainosus: Secondary | ICD-10-CM | POA: Diagnosis not present

## 2022-11-27 DIAGNOSIS — G4730 Sleep apnea, unspecified: Secondary | ICD-10-CM | POA: Diagnosis not present

## 2022-11-27 DIAGNOSIS — M5136 Other intervertebral disc degeneration, lumbar region: Secondary | ICD-10-CM | POA: Diagnosis not present

## 2022-11-27 DIAGNOSIS — M545 Low back pain, unspecified: Secondary | ICD-10-CM | POA: Diagnosis not present

## 2022-11-27 DIAGNOSIS — M5416 Radiculopathy, lumbar region: Secondary | ICD-10-CM | POA: Diagnosis not present

## 2022-11-27 DIAGNOSIS — I10 Essential (primary) hypertension: Secondary | ICD-10-CM | POA: Diagnosis not present

## 2022-11-27 DIAGNOSIS — G5603 Carpal tunnel syndrome, bilateral upper limbs: Secondary | ICD-10-CM | POA: Diagnosis not present

## 2022-11-29 ENCOUNTER — Emergency Department (HOSPITAL_COMMUNITY): Payer: Medicare Other

## 2022-11-29 ENCOUNTER — Encounter (HOSPITAL_COMMUNITY): Payer: Self-pay | Admitting: Emergency Medicine

## 2022-11-29 ENCOUNTER — Emergency Department (HOSPITAL_COMMUNITY)
Admission: EM | Admit: 2022-11-29 | Discharge: 2022-11-29 | Disposition: A | Discharge status: Home / Self Care | Payer: Medicare Other | Attending: Emergency Medicine | Admitting: Emergency Medicine

## 2022-11-29 DIAGNOSIS — S8002XA Contusion of left knee, initial encounter: Secondary | ICD-10-CM | POA: Diagnosis not present

## 2022-11-29 DIAGNOSIS — W101XXA Fall (on)(from) sidewalk curb, initial encounter: Secondary | ICD-10-CM | POA: Insufficient documentation

## 2022-11-29 DIAGNOSIS — M5136 Other intervertebral disc degeneration, lumbar region: Secondary | ICD-10-CM | POA: Diagnosis not present

## 2022-11-29 DIAGNOSIS — Y92531 Health care provider office as the place of occurrence of the external cause: Secondary | ICD-10-CM | POA: Diagnosis not present

## 2022-11-29 DIAGNOSIS — S8001XA Contusion of right knee, initial encounter: Secondary | ICD-10-CM | POA: Diagnosis not present

## 2022-11-29 DIAGNOSIS — Z981 Arthrodesis status: Secondary | ICD-10-CM | POA: Diagnosis not present

## 2022-11-29 DIAGNOSIS — W19XXXA Unspecified fall, initial encounter: Secondary | ICD-10-CM

## 2022-11-29 DIAGNOSIS — S300XXA Contusion of lower back and pelvis, initial encounter: Secondary | ICD-10-CM | POA: Diagnosis not present

## 2022-11-29 DIAGNOSIS — M47816 Spondylosis without myelopathy or radiculopathy, lumbar region: Secondary | ICD-10-CM | POA: Diagnosis not present

## 2022-11-29 DIAGNOSIS — M549 Dorsalgia, unspecified: Secondary | ICD-10-CM | POA: Diagnosis not present

## 2022-11-29 DIAGNOSIS — Q018 Encephalocele of other sites: Secondary | ICD-10-CM | POA: Diagnosis not present

## 2022-11-29 DIAGNOSIS — S40012A Contusion of left shoulder, initial encounter: Secondary | ICD-10-CM | POA: Diagnosis not present

## 2022-11-29 DIAGNOSIS — S4992XA Unspecified injury of left shoulder and upper arm, initial encounter: Secondary | ICD-10-CM | POA: Diagnosis not present

## 2022-11-29 DIAGNOSIS — G9389 Other specified disorders of brain: Secondary | ICD-10-CM | POA: Diagnosis not present

## 2022-11-29 DIAGNOSIS — M85812 Other specified disorders of bone density and structure, left shoulder: Secondary | ICD-10-CM | POA: Diagnosis not present

## 2022-11-29 DIAGNOSIS — Z043 Encounter for examination and observation following other accident: Secondary | ICD-10-CM | POA: Diagnosis not present

## 2022-11-29 DIAGNOSIS — S20229A Contusion of unspecified back wall of thorax, initial encounter: Secondary | ICD-10-CM | POA: Insufficient documentation

## 2022-11-29 DIAGNOSIS — M1711 Unilateral primary osteoarthritis, right knee: Secondary | ICD-10-CM | POA: Diagnosis not present

## 2022-11-29 DIAGNOSIS — M19012 Primary osteoarthritis, left shoulder: Secondary | ICD-10-CM | POA: Diagnosis not present

## 2022-11-29 DIAGNOSIS — T07XXXA Unspecified multiple injuries, initial encounter: Secondary | ICD-10-CM

## 2022-11-29 DIAGNOSIS — S0990XA Unspecified injury of head, initial encounter: Secondary | ICD-10-CM | POA: Diagnosis not present

## 2022-11-29 DIAGNOSIS — E1169 Type 2 diabetes mellitus with other specified complication: Secondary | ICD-10-CM | POA: Diagnosis not present

## 2022-11-29 DIAGNOSIS — M1990 Unspecified osteoarthritis, unspecified site: Secondary | ICD-10-CM | POA: Diagnosis not present

## 2022-11-29 MED ORDER — HYDROCODONE-ACETAMINOPHEN 5-325 MG PO TABS
1.0000 | ORAL_TABLET | Freq: Once | ORAL | Status: AC
  Administered 2022-11-29: 1 via ORAL
  Filled 2022-11-29: qty 1

## 2022-11-29 NOTE — Discharge Instructions (Signed)
You are seen in the emergency room for fall.  The fall was a result of leg giving out.  As discussed, it is prudent that you get the MRI done as soon as possible and see Dr. Conchita Paris. Please continue use assist device when walking and be very careful when moving, being aware of the surroundings so that we can prevent falls.

## 2022-11-29 NOTE — ED Notes (Signed)
Pt gone to CT 

## 2022-11-29 NOTE — ED Notes (Signed)
Portable Xray at bedside.

## 2022-11-29 NOTE — ED Triage Notes (Signed)
Pt reports falling at doctor office today about an hour ago. States she fell on the sidewalk when stepping up. Denies hitting head. Endorses neck and back pain. Landed on right knee and left arm.

## 2022-11-29 NOTE — ED Provider Notes (Signed)
[place] EMERGENCY DEPARTMENT AT [facility] Provider Note   CSN: [id number] Arrival date & time: 11/29/22  1626     History  Chief Complaint  Patient presents with   Rebecca Hahn is a 67 y.o. female.  HPI    67 year old female comes in with chief complaint of mechanical fall.  Patient has previous history of spine surgery.  She comes into the emergency room after mechanical fall.  Patient states that her spine surgery was 2 years ago, and over the last months she has started noticing some of the same symptoms she was having preoperatively.  She has called neurosurgery and has appointment on July 31.  In the last few days she has been noticing pain in her back that is radiating down her right leg.  Occasionally the leg will give out.  Today she had a mechanical fall when the leg gave out as she was walking to her PCP.  Pt has no associated numbness, weakness, urinary incontinence, urinary retention, bowel incontinence, pins and needle sensation in the perineal area.  Patient's daughter at the bedside.  They state that the PCP has ordered MRI, it is awaiting approval.  Patient [facility] the [facility].  Home Medications Prior to Admission medications   Medication Sig Start Date End Date Taking? Authorizing Provider  acetaminophen (TYLENOL) 500 MG tablet Take 1,000 mg by mouth in the morning and at bedtime.    [provider]  albuterol (PROVENTIL HFA;VENTOLIN HFA) 108 (90 Base) MCG/ACT inhaler Inhale 1-2 puffs into the lungs every 6 (six) hours as needed for wheezing or shortness of breath. 05/18/17   Lurene Shadow, PA-C  allopurinol (ZYLOPRIM) 100 MG tablet Take 200 mg by mouth daily.  12/31/16   [provider]  aspirin 81 MG EC tablet Take 1 tablet (81 mg total) by mouth daily. 08/17/20   Costella, Darci Current, PA-C  Carboxymethylcellulose Sodium 1 % GEL Place 1 drop into both eyes 4 (four) times daily as needed (dry eyes).    [provider]  clonazePAM (KLONOPIN) 0.5 MG tablet Take 1 tablet (0.5 mg total) by mouth 2 (two) times daily. Patient taking differently: Take 1 mg by mouth every morning. 06/29/17   Burnard Leigh, MD  fluticasone (FLONASE) 50 MCG/ACT nasal spray Place 1 spray into both nostrils daily as needed for allergies or rhinitis.    [provider]  gabapentin (NEURONTIN) 100 MG capsule Take 100-200 mg by mouth See admin instructions. 100 mg in the morning, 200 mg at bedtime 01/01/17   [provider]  leflunomide (ARAVA) 10 MG tablet Take 10 mg by mouth daily.    [provider]  loratadine (CLARITIN) 10 MG tablet Take 10 mg by mouth daily.    [provider]  meclizine (ANTIVERT) 12.5 MG tablet Take 12.5-25 mg by mouth See admin instructions. 12.5 mg in the morning, 25 mg at bedtime    [provider]  metFORMIN (GLUCOPHAGE) 500 MG tablet Take 500 mg by mouth daily with breakfast.    [provider]  metoprolol tartrate (LOPRESSOR) 50 MG tablet TAKE 1 TABLET(50 MG) BY MOUTH TWICE DAILY Patient not taking: Reported on 07/19/2021 02/02/21   Tessa Lerner, DO  Multiple Vitamins-Minerals (CENTRUM SILVER 50+WOMEN) TABS Take 1 tablet by mouth daily.    [provider]  omeprazole (PRILOSEC) 20 MG capsule Take 20 mg by mouth daily.    [provider]  promethazine (PHENERGAN) 25 MG tablet Take 25 mg by mouth 2 (two) times daily as needed for nausea or vomiting.    [provider]  rosuvastatin (CRESTOR) 20 MG tablet Take 20 mg by mouth every evening.    [provider]  sertraline (ZOLOFT) 100 MG tablet Take 1 tablet (100 mg total) by mouth at bedtime. Patient taking differently: Take 200 mg by mouth at bedtime. 10/23/17   Eksir, Bo Mcclintock, MD  SUMAtriptan (IMITREX) 100 MG tablet Take 100 mg by mouth every 2 (two) hours as needed for migraine. May repeat in 2 hours if headache persists or recurs.    [provider]  topiramate (TOPAMAX) 25 MG tablet TAKE ONE-HALF TABLET BY MOUTH ONCE A DAY AND TAKE ONE AND ONE-HALF TABLETS AT BEDTIME FOR 4 WEEKS, THEN TAKE ONE-HALF TABLET ONCE A DAY AND TAKE TWO TABLETS AT BEDTIME TO REDUCE MIGRAINE FREQUENCY/SEVERITY 10/15/20   [provider]  traZODone (DESYREL) 100 MG tablet Take 1 tablet (100 mg total) by mouth at bedtime. 06/29/17   Eksir, Bo Mcclintock, MD      Allergies    Oxycodone and Penicillins    Review of Systems   Review of Systems  All other systems reviewed and are negative.   Physical Exam Updated Vital Signs BP 119/83 (BP Location: Left Arm)   Pulse (!) 106   Temp 98.6 F (37 C)   Resp 16   Ht 5\' 6"  (1.676 m)   Wt 117 kg   SpO2 96%   BMI 41.63 kg/m  Physical Exam Vitals and nursing note reviewed.  Constitutional:      Appearance: She is well-developed.  HENT:     Head: Atraumatic.  Eyes:     Extraocular Movements: Extraocular movements intact.     Pupils: Pupils are equal, round, and reactive to light.  Cardiovascular:     Rate and Rhythm: Normal rate.  Pulmonary:     Effort: Pulmonary effort is normal.  Musculoskeletal:     Cervical back: Normal range of motion and neck supple.     Comments: Tenderness over the left shoulder, right knee, thoracic and lumbar spine. No midline c-spine tenderness, pt able to turn head to 45 degrees bilaterally without any pain and able to flex neck to the chest and extend without any pain or neurologic symptoms.   Skin:    General: Skin is warm and dry.  Neurological:     Mental Status: She is alert and oriented to person, place, and time.     ED Results / Procedures / Treatments   Labs (all labs ordered are listed, but only abnormal results are displayed) Labs Reviewed - No data to display  EKG None  Radiology DG Lumbar Spine Complete  Result Date: 11/29/2022 CLINICAL DATA:  Trauma, fall, back pain EXAM: LUMBAR SPINE - COMPLETE 4+ VIEW COMPARISON:  MR lumbar  spine on 01/28/2020 FINDINGS: No recent fracture is seen. There is posterior fusion at L4-L5 level. Degenerative changes are noted with bony spurs throughout lumbar spine. IMPRESSION: No recent fracture is seen. Lumbar spondylosis. Previous surgical fusion at the L4-L5 level. Electronically Signed   By: Ernie Avena M.D.   On: 11/29/2022 18:45   DG Thoracic Spine 2 View  Result Date: 11/29/2022 CLINICAL DATA:  Trauma, fall, back pain EXAM: THORACIC SPINE 2 VIEWS COMPARISON:  None Available. FINDINGS: No recent fracture is seen. Alignment of posterior margins of vertebral bodies appears normal. Degenerative changes are noted with bony spurs in  mid and lower thoracic spine. Paraspinal soft tissues are unremarkable. There is linear structure in right side of chest suggesting possible artifact outside the patient's body or indwelling catheter. IMPRESSION: No recent fracture is seen in thoracic spine. Degenerative changes are noted with bony spurs in mid and lower thoracic spine. Electronically Signed   By: Ernie Avena M.D.   On: 11/29/2022 18:43   CT Head Wo Contrast  Result Date: 11/29/2022 CLINICAL DATA:  Head trauma, minor.  Fall. EXAM: CT HEAD WITHOUT CONTRAST TECHNIQUE: Contiguous axial images were obtained from the base of the skull through the vertex without intravenous contrast. RADIATION DOSE REDUCTION: This exam was performed according to the departmental dose-optimization program which includes automated exposure control, adjustment of the mA and/or kV according to patient size and/or use of iterative reconstruction technique. COMPARISON:  Head CT 01/27/2017.  MRI brain 08/23/2017. FINDINGS: Brain: No acute intracranial hemorrhage. Unchanged chronic bilateral temporal encephalocele falls with associated gliosis in the left temporal lobe. Gray-white differentiation is otherwise preserved. No hydrocephalus or extra-axial collection. No mass effect or midline shift. Vascular: No hyperdense  vessel or unexpected calcification. Skull: No calvarial fracture or suspicious bone lesion. Skull base is unremarkable. Sinuses/Orbits: Unremarkable. Other: None. IMPRESSION: 1. No evidence of acute intracranial injury. 2. Unchanged chronic bilateral temporal encephalocele falls with associated gliosis in the left temporal lobe. Electronically Signed   By: Orvan Falconer M.D.   On: 11/29/2022 18:27   DG Knee Complete 4 Views Right  Result Date: 11/29/2022 CLINICAL DATA:  fall EXAM: RIGHT KNEE - COMPLETE 4+ VIEW COMPARISON:  None Available. FINDINGS: No acute fracture or dislocation. Mild tricompartmental degenerative changes. No area of erosion or osseous destruction. No unexpected radiopaque foreign body. Rounded calcific density projecting anterior to the patella favored to reflect a tiny soft tissue calcification. IMPRESSION: 1. No acute fracture or dislocation. Electronically Signed   By: Meda Klinefelter M.D.   On: 11/29/2022 17:42   DG Shoulder Left  Result Date: 11/29/2022 CLINICAL DATA:  Fall and trauma to the left shoulder. EXAM: LEFT SHOULDER - 2+ VIEW COMPARISON:  None Available. FINDINGS: There is no acute fracture or dislocation. The bones are mildly osteopenic. There is mild degenerative changes of the left shoulder. There is slight elevation of the humeral head with narrowing of the acromial humeral distance which can predispose to impingement. Mild degenerative changes of the left AC joint. The soft tissues are unremarkable. IMPRESSION: 1. No acute fracture or dislocation. 2. Mild degenerative changes. Electronically Signed   By: Elgie Collard M.D.   On: 11/29/2022 17:41    Procedures Procedures    Medications Ordered in ED Medications  HYDROcodone-acetaminophen (NORCO/VICODIN) 5-325 MG per tablet 1 tablet (1 tablet Oral Given 11/29/22 1833)    ED Course/ Medical Decision Making/ A&P                             Medical Decision Making Amount and/or Complexity of Data  Reviewed Radiology: ordered.  Risk Prescription drug management.   67 year old patient comes in with chief complaint of mechanical fall.  She has history of spine surgery, and it appears that recently she has been having radicular symptoms similar to her preoperative condition.  She is also having intermittent leg giving out.  No red flags for cauda equina or cord compression.  Differential diagnosis for her includes spinal stenosis, degenerative spine disease with radiculopathy, from the fall differential diagnosis includes contusion, soft tissue sprain  or strain, TBI, fracture.  Appropriate radiographs have been ordered.  I feel comfortable clearing the C-spine clinically.  CT scan of the brain ordered because of age over 72 and fall.  Reassessment: I have independently interpreted patient's CT scan of the brain.  Negative for brain bleed.  X-rays are also reassuring.  I consulted neurosurgery and spoke with APP Megan from neurosurgical team.  She will send a message to Dr. Conchita Paris to see if patient can be seen sooner.  Patient aware that the visit to the neurosurgery is useless without the MRI, therefore she needs to ensure the MRI is completed prior to seeing neurosurgery.  Final Clinical Impression(s) / ED Diagnoses Final diagnoses:  Fall, initial encounter  Multiple contusions    Rx / DC Orders ED Discharge Orders     None         Derwood Kaplan, MD 11/29/22 1958

## 2022-11-29 NOTE — ED Notes (Signed)
Pt and family educated on DC instructions and verbalizes understanding, pt RR even and unlabored , pt wheeled to lobby via wheelchair

## 2022-12-01 ENCOUNTER — Encounter: Payer: Self-pay | Admitting: Internal Medicine

## 2022-12-01 ENCOUNTER — Other Ambulatory Visit: Payer: Self-pay | Admitting: Internal Medicine

## 2022-12-01 DIAGNOSIS — G629 Polyneuropathy, unspecified: Secondary | ICD-10-CM | POA: Diagnosis not present

## 2022-12-01 DIAGNOSIS — M5136 Other intervertebral disc degeneration, lumbar region: Secondary | ICD-10-CM

## 2022-12-02 ENCOUNTER — Ambulatory Visit
Admission: RE | Admit: 2022-12-02 | Discharge: 2022-12-02 | Disposition: A | Discharge status: Home / Self Care | Payer: Federal, State, Local not specified - PPO | Source: Ambulatory Visit | Attending: Internal Medicine | Admitting: Internal Medicine

## 2022-12-02 DIAGNOSIS — M5136 Other intervertebral disc degeneration, lumbar region: Secondary | ICD-10-CM

## 2022-12-02 DIAGNOSIS — M5126 Other intervertebral disc displacement, lumbar region: Secondary | ICD-10-CM | POA: Diagnosis not present

## 2022-12-02 DIAGNOSIS — M48061 Spinal stenosis, lumbar region without neurogenic claudication: Secondary | ICD-10-CM | POA: Diagnosis not present

## 2022-12-04 DIAGNOSIS — R5383 Other fatigue: Secondary | ICD-10-CM | POA: Diagnosis not present

## 2022-12-04 DIAGNOSIS — R200 Anesthesia of skin: Secondary | ICD-10-CM | POA: Diagnosis not present

## 2022-12-04 DIAGNOSIS — R202 Paresthesia of skin: Secondary | ICD-10-CM | POA: Diagnosis not present

## 2022-12-04 DIAGNOSIS — R531 Weakness: Secondary | ICD-10-CM | POA: Diagnosis not present

## 2022-12-04 DIAGNOSIS — M545 Low back pain, unspecified: Secondary | ICD-10-CM | POA: Diagnosis not present

## 2022-12-05 ENCOUNTER — Telehealth: Payer: Self-pay

## 2022-12-05 NOTE — Telephone Encounter (Signed)
Transition Care Management Follow-up Telephone Call Date of discharge and from where: 11/29/2022  How have you been since you were released from the hospital? Patient is feeling better. Any questions or concerns? No  Items Reviewed: Did the pt receive and understand the discharge instructions provided? Yes  Medications obtained and verified? Yes  Other? No  Any new allergies since your discharge? No  Dietary orders reviewed? Yes Do you have support at home? Yes   Follow up appointments reviewed:  PCP Hospital f/u appt confirmed? Yes  Scheduled to see Georgianne Fick, MD on 12/01/2022 @ [facility]. Specialist Hospital f/u appt confirmed? No  Scheduled to see  on  @ . Are transportation arrangements needed?  Emailed and mailed information for [employer] transportation and [employer] transportation If their condition worsens, is the pt aware to call PCP or go to the Emergency Dept.? Yes Was the patient provided with contact information for the PCP's office or ED? Yes Was to pt encouraged to call back with questions or concerns? Yes  Kimika Streater [facility]  [facility] Population Health Community Resource Care Guide   [email or web address]  ?? [phone]   Website: [email or web address]  [email or web address]

## 2022-12-05 NOTE — Telephone Encounter (Signed)
Transition Care Management Unsuccessful Follow-up Telephone Call  Date of discharge and from where:  11/29/2022 [facility]  Attempts:  1st Attempt  Reason for unsuccessful TCM follow-up call:  Left voice message  Naliya Gish [facility]  [facility] Population Health Community Resource Care Guide   [email or web address]  ?? [phone]   Website: [email or web address]  [email or web address]

## 2022-12-06 ENCOUNTER — Encounter: Payer: Self-pay | Admitting: Neurology

## 2022-12-12 ENCOUNTER — Ambulatory Visit: Payer: Self-pay | Admitting: *Deleted

## 2022-12-12 NOTE — Patient Outreach (Signed)
  Care Coordination   Initial Visit Note   12/12/2022 Name: Rebecca Hahn MRN: [id number] DOB: 10-12-1955  Rebecca Hahn is a 67 y.o. year old female who sees Georgianne Fick, MD for primary care. I spoke with  Rebecca Hahn by phone today.  What matters to the patients health and wellness today?  Pt reported "yes" to feeling sad, lonely and empty" during her post hospital EMMI call.    Goals Addressed             This Visit's Progress    COMPLETED: Offer mental health support       Activities and task to complete in order to accomplish goals.   Continue with your Psychiatrist and Mental Health Team  Keep all upcoming appointments discussed today Continue with compliance of taking medication prescribed by Doctor Self Support options  (practice positive self care/self talk Call [phone] for 24/7 counseling support        SDOH assessments and interventions completed:  Yes  SDOH Interventions Today    Flowsheet Row Most Recent Value  SDOH Interventions   Food Insecurity Interventions Intervention Not Indicated  Housing Interventions Intervention Not Indicated  Transportation Interventions Other (Comment)  [Pt does not drive but daughter drives her to appointments (wants to go with her and she is a RN)]  Utilities Interventions Intervention Not Indicated        Care Coordination Interventions:  Yes, provided  Interventions Today    Flowsheet Row Most Recent Value  Chronic Disease   Chronic disease during today's visit Other  [depression]  General Interventions   General Interventions Discussed/Reviewed General Interventions Discussed, General Interventions Reviewed, Community Resources  Mental Health Interventions   Mental Health Discussed/Reviewed Mental Health Discussed, Mental Health Reviewed, Coping Strategies, Anxiety, Depression  [Pt reports history of depression and anxiety- takes RX and has a mental health team which includes Dr Gaspar Skeeters (tele-health  Psychiatrist who she has seen since 2018). Pt also reports strong faith and supportive family. Denies further needs]  Safety Interventions   Safety Discussed/Reviewed Safety Discussed  [Advised pt of mental health hotline 24/7  "[id number]"]       Follow up plan: No further intervention required.   Encounter Outcome:  Pt. Visit Completed

## 2022-12-18 ENCOUNTER — Other Ambulatory Visit: Payer: Self-pay

## 2022-12-18 DIAGNOSIS — R202 Paresthesia of skin: Secondary | ICD-10-CM

## 2022-12-19 ENCOUNTER — Encounter: Payer: Federal, State, Local not specified - PPO | Admitting: Neurology

## 2022-12-20 DIAGNOSIS — M48062 Spinal stenosis, lumbar region with neurogenic claudication: Secondary | ICD-10-CM | POA: Diagnosis not present

## 2022-12-28 ENCOUNTER — Ambulatory Visit (INDEPENDENT_AMBULATORY_CARE_PROVIDER_SITE_OTHER): Payer: Medicare Other | Admitting: Neurology

## 2022-12-28 DIAGNOSIS — R202 Paresthesia of skin: Secondary | ICD-10-CM

## 2022-12-28 DIAGNOSIS — M5416 Radiculopathy, lumbar region: Secondary | ICD-10-CM

## 2022-12-28 NOTE — Procedures (Signed)
  [facility]  [address], [address]  [place], [place] [address] Tel: [phone] Fax: [phone] Test Date:  12/28/2022  Patient: Rebecca Hahn DOB: 10-May-1956 Physician: Nita Sickle, DO  Sex: Female Height: 5\' 6"  Ref Phys: Zenda Alpers, MD  ID#: [id number]   Technician:    History: This is a 67 year old female referred for evaluation of right leg pain.  NCV & EMG Findings: Extensive electrodiagnostic testing of the right lower extremity shows:  Right sural and superficial peroneal sensory responses are within normal limits. Right peroneal and tibial motor responses are within normal limits. Right tibial H reflex study is within normal limits. Chronic motor axonal loss changes are seen affecting the right L2-3 myotome, without accompanied active denervation.  Impression: Chronic L2-3 radiculopathy affecting the right lower extremity, mild. There is no evidence of a large fiber sensorimotor polyneuropathy affecting the right lower extremity.   ___________________________ Nita Sickle, DO    Nerve Conduction Studies   Stim Site NR Peak (ms) Norm Peak (ms) O-P Amp (V) Norm O-P Amp  Right Sup Peroneal Anti Sensory (Ant Lat Mall)  32 C  12 cm    2.8 <4.6 6.8 >3  Right Sural Anti Sensory (Lat Mall)  32 C  Calf    2.7 <4.6 15.3 >3     Stim Site NR Onset (ms) Norm Onset (ms) O-P Amp (mV) Norm O-P Amp Site1 Site2 Delta-0 (ms) Dist (cm) Vel (m/s) Norm Vel (m/s)  Right Peroneal Motor (Ext Dig Brev)  32 C  Ankle    3.1 <6.0 7.1 >2.5 B Fib Ankle 8.7 42.0 48 >40  B Fib    11.8  6.5  Poplt B Fib 1.7 10.0 59 >40  Poplt    13.5  6.0         Right Tibial Motor (Abd Hall Brev)  32 C  Ankle    5.4 <6.0 4.3 >4 Knee Ankle 8.7 43.0 49 >40  Knee    14.1  2.8          Electromyography   Side Muscle Ins.Act Fibs Fasc Recrt Amp Dur Poly Activation Comment  Right AntTibialis Nml Nml Nml Nml Nml Nml Nml Nml N/A  Right Gastroc Nml Nml Nml Nml Nml Nml Nml Nml N/A   Right Flex Dig Long Nml Nml Nml Nml Nml Nml Nml Nml N/A  Right RectFemoris Nml Nml Nml *1- *1+ *1+ *1+ Nml N/A  Right GluteusMed Nml Nml Nml Nml Nml Nml Nml Nml N/A  Right AdductorLong Nml Nml Nml *1- *1+ *1+ *1+ Nml N/A      Waveforms:

## 2023-01-09 DIAGNOSIS — M48062 Spinal stenosis, lumbar region with neurogenic claudication: Secondary | ICD-10-CM | POA: Diagnosis not present

## 2023-01-17 DIAGNOSIS — M48062 Spinal stenosis, lumbar region with neurogenic claudication: Secondary | ICD-10-CM | POA: Diagnosis not present

## 2023-01-23 DIAGNOSIS — R519 Headache, unspecified: Secondary | ICD-10-CM | POA: Diagnosis not present

## 2023-01-23 DIAGNOSIS — M48061 Spinal stenosis, lumbar region without neurogenic claudication: Secondary | ICD-10-CM | POA: Diagnosis not present

## 2023-01-23 DIAGNOSIS — M519 Unspecified thoracic, thoracolumbar and lumbosacral intervertebral disc disorder: Secondary | ICD-10-CM | POA: Diagnosis not present

## 2023-01-23 DIAGNOSIS — M5137 Other intervertebral disc degeneration, lumbosacral region: Secondary | ICD-10-CM | POA: Diagnosis not present

## 2023-01-23 DIAGNOSIS — M47816 Spondylosis without myelopathy or radiculopathy, lumbar region: Secondary | ICD-10-CM | POA: Diagnosis not present

## 2023-01-23 DIAGNOSIS — M5136 Other intervertebral disc degeneration, lumbar region: Secondary | ICD-10-CM | POA: Diagnosis not present

## 2023-02-01 ENCOUNTER — Ambulatory Visit: Payer: Federal, State, Local not specified - PPO

## 2023-02-21 DIAGNOSIS — H16223 Keratoconjunctivitis sicca, not specified as Sjogren's, bilateral: Secondary | ICD-10-CM | POA: Diagnosis not present

## 2023-03-06 DIAGNOSIS — Z981 Arthrodesis status: Secondary | ICD-10-CM | POA: Diagnosis not present

## 2023-03-06 DIAGNOSIS — G9609 Other spinal cerebrospinal fluid leak: Secondary | ICD-10-CM | POA: Diagnosis not present

## 2023-03-06 DIAGNOSIS — M5126 Other intervertebral disc displacement, lumbar region: Secondary | ICD-10-CM | POA: Diagnosis not present

## 2023-03-06 DIAGNOSIS — M48061 Spinal stenosis, lumbar region without neurogenic claudication: Secondary | ICD-10-CM | POA: Diagnosis not present

## 2023-03-14 ENCOUNTER — Emergency Department (HOSPITAL_BASED_OUTPATIENT_CLINIC_OR_DEPARTMENT_OTHER): Payer: No Typology Code available for payment source

## 2023-03-14 ENCOUNTER — Encounter (HOSPITAL_BASED_OUTPATIENT_CLINIC_OR_DEPARTMENT_OTHER): Payer: Self-pay | Admitting: Emergency Medicine

## 2023-03-14 ENCOUNTER — Emergency Department (HOSPITAL_BASED_OUTPATIENT_CLINIC_OR_DEPARTMENT_OTHER)
Admission: EM | Admit: 2023-03-14 | Discharge: 2023-03-14 | Disposition: A | Discharge status: Home / Self Care | Payer: No Typology Code available for payment source | Attending: Emergency Medicine | Admitting: Emergency Medicine

## 2023-03-14 DIAGNOSIS — M7989 Other specified soft tissue disorders: Secondary | ICD-10-CM | POA: Diagnosis not present

## 2023-03-14 DIAGNOSIS — Z7984 Long term (current) use of oral hypoglycemic drugs: Secondary | ICD-10-CM | POA: Diagnosis not present

## 2023-03-14 DIAGNOSIS — R918 Other nonspecific abnormal finding of lung field: Secondary | ICD-10-CM | POA: Diagnosis not present

## 2023-03-14 DIAGNOSIS — J984 Other disorders of lung: Secondary | ICD-10-CM | POA: Diagnosis not present

## 2023-03-14 DIAGNOSIS — R0689 Other abnormalities of breathing: Secondary | ICD-10-CM | POA: Insufficient documentation

## 2023-03-14 DIAGNOSIS — M7122 Synovial cyst of popliteal space [Baker], left knee: Secondary | ICD-10-CM | POA: Diagnosis not present

## 2023-03-14 DIAGNOSIS — Z7982 Long term (current) use of aspirin: Secondary | ICD-10-CM | POA: Insufficient documentation

## 2023-03-14 DIAGNOSIS — R600 Localized edema: Secondary | ICD-10-CM | POA: Diagnosis not present

## 2023-03-14 DIAGNOSIS — E119 Type 2 diabetes mellitus without complications: Secondary | ICD-10-CM | POA: Insufficient documentation

## 2023-03-14 DIAGNOSIS — M79605 Pain in left leg: Secondary | ICD-10-CM | POA: Diagnosis not present

## 2023-03-14 LAB — CBC
HCT: 34.3 % — ABNORMAL LOW (ref 36.0–46.0)
Hemoglobin: 10.8 g/dL — ABNORMAL LOW (ref 12.0–15.0)
MCH: 28.3 pg (ref 26.0–34.0)
MCHC: 31.5 g/dL (ref 30.0–36.0)
MCV: 90 fL (ref 80.0–100.0)
Platelets: 347 10*3/uL (ref 150–400)
RBC: 3.81 MIL/uL — ABNORMAL LOW (ref 3.87–5.11)
RDW: 15.3 % (ref 11.5–15.5)
WBC: 9.6 10*3/uL (ref 4.0–10.5)
nRBC: 0 % (ref 0.0–0.2)

## 2023-03-14 LAB — BASIC METABOLIC PANEL
Anion gap: 6 (ref 5–15)
BUN: 12 mg/dL (ref 8–23)
CO2: 34 mmol/L — ABNORMAL HIGH (ref 22–32)
Calcium: 9.3 mg/dL (ref 8.9–10.3)
Chloride: 104 mmol/L (ref 98–111)
Creatinine, Ser: 0.89 mg/dL (ref 0.44–1.00)
GFR, Estimated: >60 mL/min (ref >60)
Glucose, Bld: 100 mg/dL — ABNORMAL HIGH (ref 70–99)
Potassium: 3.4 mmol/L — ABNORMAL LOW (ref 3.5–5.1)
Sodium: 144 mmol/L (ref 135–145)

## 2023-03-14 MED ORDER — IOHEXOL 350 MG/ML SOLN
100.0000 mL | Freq: Once | INTRAVENOUS | Status: AC | PRN
  Administered 2023-03-14: 85 mL via INTRAVENOUS

## 2023-03-14 MED ORDER — OXYCODONE-ACETAMINOPHEN 5-325 MG PO TABS
1.0000 | ORAL_TABLET | Freq: Once | ORAL | Status: AC
  Administered 2023-03-14: 1 via ORAL
  Filled 2023-03-14: qty 1

## 2023-03-14 NOTE — ED Notes (Signed)
Per patient's daughter, SpO2 has been maintaining 87-89% at home. With pursed lip breathing at home, SpO2 92%. Patient placed on [facility] at this time.

## 2023-03-14 NOTE — ED Provider Notes (Incomplete)
[facility] Provider Note   CSN: [id number] Arrival date & time: 03/14/23  1742     History {Add pertinent medical, surgical, social history, OB history to HPI:1} Chief Complaint  Patient presents with  . Leg Swelling    Rebecca Hahn is a 67 y.o. female with a history of Sjogren syndrome, diabetes, lumbar spine stenosis, and vertigo who presents the ED today for leg pain.  Patient reports pain to the whole left leg that began this morning with swelling at the knee.  Patient had surgery on L2 and L3 last week.  She states that she is taking baby aspirin but denies any other blood thinner use.  Additionally, patient's daughter reports that she has been monitoring her O2 which has dropped as low as in the 80s.  Patient denies shortness of breath but does admit to pains in her chest since the surgery.  She states that this is something that she has felt prior and has been evaluated by cardiology for the past.  Patient states that she history of sleep apnea for which she uses a CPAP at night but otherwise does not use supplemental oxygen at home.  She admits to some tingling of the left lateral leg but denies numbness or loss of sensation. No fevers, drainage, or pain at the incision site.  No other complaints or concerns at this time.    Home Medications Prior to Admission medications   Medication Sig Start Date End Date Taking? Authorizing Provider  acetaminophen (TYLENOL) 500 MG tablet Take 1,000 mg by mouth in the morning and at bedtime.    [provider]  albuterol (PROVENTIL HFA;VENTOLIN HFA) 108 (90 Base) MCG/ACT inhaler Inhale 1-2 puffs into the lungs every 6 (six) hours as needed for wheezing or shortness of breath. 05/18/17   Lurene Shadow, PA-C  allopurinol (ZYLOPRIM) 100 MG tablet Take 200 mg by mouth daily.  12/31/16   [provider]  aspirin 81 MG EC tablet Take 1 tablet (81 mg total) by mouth daily. 08/17/20    Costella, Darci Current, PA-C  Carboxymethylcellulose Sodium 1 % GEL Place 1 drop into both eyes 4 (four) times daily as needed (dry eyes).    [provider]  clonazePAM (KLONOPIN) 0.5 MG tablet Take 1 tablet (0.5 mg total) by mouth 2 (two) times daily. Patient taking differently: Take 1 mg by mouth every morning. 06/29/17   Burnard Leigh, MD  fluticasone (FLONASE) 50 MCG/ACT nasal spray Place 1 spray into both nostrils daily as needed for allergies or rhinitis.    [provider]  gabapentin (NEURONTIN) 100 MG capsule Take 100-200 mg by mouth See admin instructions. 100 mg in the morning, 200 mg at bedtime 01/01/17   [provider]  leflunomide (ARAVA) 10 MG tablet Take 10 mg by mouth daily.    [provider]  loratadine (CLARITIN) 10 MG tablet Take 10 mg by mouth daily.    [provider]  meclizine (ANTIVERT) 12.5 MG tablet Take 12.5-25 mg by mouth See admin instructions. 12.5 mg in the morning, 25 mg at bedtime    [provider]  metFORMIN (GLUCOPHAGE) 500 MG tablet Take 500 mg by mouth daily with breakfast.    [provider]  metoprolol tartrate (LOPRESSOR) 50 MG tablet TAKE 1 TABLET(50 MG) BY MOUTH TWICE DAILY Patient not taking: Reported on 07/19/2021 02/02/21   Tessa Lerner, DO  Multiple Vitamins-Minerals (CENTRUM SILVER 50+WOMEN) TABS Take 1 tablet by  mouth daily.    [provider]  omeprazole (PRILOSEC) 20 MG capsule Take 20 mg by mouth daily.    [provider]  promethazine (PHENERGAN) 25 MG tablet Take 25 mg by mouth 2 (two) times daily as needed for nausea or vomiting.    [provider]  rosuvastatin (CRESTOR) 20 MG tablet Take 20 mg by mouth every evening.    [provider]  sertraline (ZOLOFT) 100 MG tablet Take 1 tablet (100 mg total) by mouth at bedtime. Patient taking differently: Take 200 mg by mouth at bedtime. 10/23/17   Eksir, Bo Mcclintock, MD  SUMAtriptan (IMITREX) 100  MG tablet Take 100 mg by mouth every 2 (two) hours as needed for migraine. May repeat in 2 hours if headache persists or recurs.    [provider]  topiramate (TOPAMAX) 25 MG tablet TAKE ONE-HALF TABLET BY MOUTH ONCE A DAY AND TAKE ONE AND ONE-HALF TABLETS AT BEDTIME FOR 4 WEEKS, THEN TAKE ONE-HALF TABLET ONCE A DAY AND TAKE TWO TABLETS AT BEDTIME TO REDUCE MIGRAINE FREQUENCY/SEVERITY 10/15/20   [provider]  traZODone (DESYREL) 100 MG tablet Take 1 tablet (100 mg total) by mouth at bedtime. 06/29/17   Burnard Leigh, MD      Allergies    Oxycodone and Penicillins    Review of Systems   Review of Systems  Musculoskeletal:        Left leg pain  All other systems reviewed and are negative.   Physical Exam Updated Vital Signs BP 128/84 (BP Location: Right Arm)   Pulse 89   Temp 98.6 F (37 C) (Oral)   Resp 20   Wt 108.4 kg   SpO2 94%   BMI 38.58 kg/m  Physical Exam Vitals and nursing note reviewed.  Constitutional:      General: She is not in acute distress.    Appearance: Normal appearance.  HENT:     Head: Normocephalic and atraumatic.     Mouth/Throat:     Mouth: Mucous membranes are moist.  Eyes:     Conjunctiva/sclera: Conjunctivae normal.     Pupils: Pupils are equal, round, and reactive to light.  Cardiovascular:     Rate and Rhythm: Normal rate and regular rhythm.     Pulses: Normal pulses.     Heart sounds: Normal heart sounds.  Pulmonary:     Effort: Pulmonary effort is normal.     Breath sounds: Normal breath sounds.     Comments: Patient is wearing supplemental oxygen Abdominal:     Palpations: Abdomen is soft.     Tenderness: There is no abdominal tenderness.  Musculoskeletal:        General: Swelling and tenderness present.  Skin:    General: Skin is warm and dry.     Findings: No rash.  Neurological:     General: No focal deficit present.     Mental Status: She is alert.     Sensory: No sensory deficit.     Motor: No  weakness.  Psychiatric:        Mood and Affect: Mood normal.        Behavior: Behavior normal.    ED Results / Procedures / Treatments   Labs (all labs ordered are listed, but only abnormal results are displayed) Labs Reviewed  CBC - Abnormal; Notable for the following components:      Result Value   RBC 3.81 (*)    Hemoglobin 10.8 (*)    HCT 34.3 (*)  All other components within normal limits  BASIC METABOLIC PANEL - Abnormal; Notable for the following components:   Potassium 3.4 (*)    CO2 34 (*)    Glucose, Bld 100 (*)    All other components within normal limits    EKG None  Radiology CT Angio Chest PE W and/or Wo Contrast  Result Date: 03/14/2023 CLINICAL DATA:  Pulmonary embolus suspected with low to intermediate probability. Negative D-dimer. Left leg pain and swelling. Back surgery 1 week ago. EXAM: CT ANGIOGRAPHY CHEST WITH CONTRAST TECHNIQUE: Multidetector CT imaging of the chest was performed using the standard protocol during bolus administration of intravenous contrast. Multiplanar CT image reconstructions and MIPs were obtained to evaluate the vascular anatomy. RADIATION DOSE REDUCTION: This exam was performed according to the departmental dose-optimization program which includes automated exposure control, adjustment of the mA and/or kV according to patient size and/or use of iterative reconstruction technique. CONTRAST:  85mL OMNIPAQUE IOHEXOL 350 MG/ML SOLN COMPARISON:  Coronary CT 02/16/2021 FINDINGS: Cardiovascular: Examination is technically limited due to suboptimal contrast bolus and motion artifact. The visualized central and proximal segmental pulmonary arteries appear patent. No central pulmonary embolus is identified. Peripheral emboli could be obscured due to technique. Normal heart size. No pericardial effusions. Normal caliber thoracic aorta. No aortic dissection. Great vessel origins are patent. Mediastinum/Nodes: No enlarged mediastinal, hilar, or  axillary lymph nodes. Thyroid gland, trachea, and esophagus demonstrate no significant findings. Lungs/Pleura: Motion artifact limits examination. Patchy peripheral infiltrates with linear bandlike opacities in the right lower lung. Changes may represent multifocal pneumonia or atelectasis. There is suggestion of secretions in the right lower lobe bronchi. No pleural effusions. No pneumothorax. Upper Abdomen: No acute abnormalities. Musculoskeletal: Degenerative changes in the spine. No acute bony abnormalities. Review of the MIP images confirms the above findings. IMPRESSION: 1. Technically limited study but no large central pulmonary emboli are identified. 2. Patchy peripheral infiltrates in the lungs with linear bandlike opacities in the right lower lung, possibly multifocal pneumonia or atelectasis. Probable secretions in the right lower lobe bronchi. Electronically Signed   By: Burman Nieves M.D.   On: 03/14/2023 23:18   US Venous Img Lower Unilateral Left  Result Date: 03/14/2023 CLINICAL DATA:  Postoperative leg swelling. Left lower extremity pain and edema. Back surgery 1 week ago. EXAM: Left LOWER EXTREMITY VENOUS DOPPLER ULTRASOUND TECHNIQUE: Gray-scale sonography with compression, as well as color and duplex ultrasound, were performed to evaluate the deep venous system(s) from the level of the common femoral vein through the popliteal and proximal calf veins. COMPARISON:  None Available. FINDINGS: VENOUS Normal compressibility of the common femoral, superficial femoral, and popliteal veins, as well as the visualized calf veins. Visualized portions of profunda femoral vein and great saphenous vein unremarkable. No filling defects to suggest DVT on grayscale or color Doppler imaging. Doppler waveforms show normal direction of venous flow, normal respiratory plasticity and response to augmentation. Limited views of the contralateral common femoral vein are unremarkable. OTHER Popliteal cyst in the  posterior fossa on the left measuring 3.2 x 1.2 x 1 cm. Limitations: none IMPRESSION: 1. No evidence of acute deep venous thrombosis in the visualized lower extremity veins. 2. Left popliteal cyst. Electronically Signed   By: Burman Nieves M.D.   On: 03/14/2023 21:17    Procedures Procedures: not indicated. {Document cardiac monitor, telemetry assessment procedure when appropriate:1}  Medications Ordered in ED Medications  oxyCODONE-acetaminophen (PERCOCET/ROXICET) 5-325 MG per tablet 1 tablet (1 tablet Oral Given 03/14/23 2150)  iohexol (  OMNIPAQUE) 350 MG/ML injection 100 mL (85 mLs Intravenous Contrast Given 03/14/23 2220)    ED Course/ Medical Decision Making/ A&P   {   Click here for ABCD2, HEART and other calculatorsREFRESH Note before signing :1}                              Medical Decision Making Amount and/or Complexity of Data Reviewed Labs: ordered. Radiology: ordered.  Risk Prescription drug management.   This patient presents to the ED for concern of left leg pain 1 week post-op, this involves an extensive number of treatment options, and is a complaint that carries with it a high risk of complications and morbidity.   Differential diagnosis includes: DVT, PE,   Comorbidities  See HPI above   Additional History  Additional history obtained from previous records.   Lab Tests  I ordered and personally interpreted labs.  The pertinent results include:   BMP and CBC are within normal limits - no acute abnormality, AKI, infection, or anemia.   Imaging Studies  I ordered imaging studies including DVT study and CTA PE study  I independently visualized and interpreted imaging which showed:  Left lower extremity ultrasound showed no evidence of acute DVT.  Left popliteal cyst. CTA showed no large central pulmonary embolism.  Patchy peripheral infiltrates in the lungs with linear bandlike opacities in the right lower lobe -could be in the multifocal pneumonia  or atelectasis. I agree with the radiologist interpretation Since no elevated WBC, fever, cough, dyspnea, or chest pain - will not treat as pneumonia   Problem List / ED Course / Critical Interventions / Medication Management  Left leg pain I ordered medications including: Percocet for pain  Reevaluation of the patient after these medicines showed that the patient improved I have reviewed the patients home medicines and have made adjustments as needed   Social Determinants of Health  Tobacco use   Test / Admission - Considered  Discussed findings with patient and family member at bedside. She is hemodynamically stable and safe for discharge home. Return precautions provided. {Document critical care time when appropriate:1} {Document review of labs and clinical decision tools ie heart score, Chads2Vasc2 etc:1}  {Document your independent review of radiology images, and any outside records:1} {Document your discussion with family members, caretakers, and with consultants:1} {Document social determinants of health affecting pt's care:1} {Document your decision making why or why not admission, treatments were needed:1} Final Clinical Impression(s) / ED Diagnoses Final diagnoses:  None    Rx / DC Orders ED Discharge Orders     None

## 2023-03-14 NOTE — ED Provider Notes (Signed)
[place] EMERGENCY DEPARTMENT AT [facility] Provider Note   CSN: [id number] Arrival date & time: 03/14/23  1742     History  Chief Complaint  Patient presents with   Leg Swelling    Rebecca Hahn is a 67 y.o. female with a history of Sjogren syndrome, diabetes, lumbar spine stenosis, and vertigo who presents the ED today for leg pain.  Patient reports pain to the whole left leg that began this morning with swelling at the knee.  Patient had surgery on L2 and L3 last week.  She states that she is taking baby aspirin but denies any other blood thinner use.  Additionally, patient's daughter reports that she has been monitoring her O2 which has dropped as low as in the 80s.  Patient denies shortness of breath but does admit to pains in her chest since the surgery.  She states that this is something that she has felt prior and has been evaluated by cardiology for the past.  Patient states that she history of sleep apnea for which she uses a CPAP at night but otherwise does not use supplemental oxygen at home.  She admits to some tingling of the left lateral leg but denies numbness or loss of sensation. No fevers, drainage, or pain at the incision site.  No other complaints or concerns at this time.    Home Medications Prior to Admission medications   Medication Sig Start Date End Date Taking? Authorizing Provider  acetaminophen (TYLENOL) 500 MG tablet Take 1,000 mg by mouth in the morning and at bedtime.    [provider]  albuterol (PROVENTIL HFA;VENTOLIN HFA) 108 (90 Base) MCG/ACT inhaler Inhale 1-2 puffs into the lungs every 6 (six) hours as needed for wheezing or shortness of breath. 05/18/17   Lurene Shadow, PA-C  allopurinol (ZYLOPRIM) 100 MG tablet Take 200 mg by mouth daily.  12/31/16   [provider]  aspirin 81 MG EC tablet Take 1 tablet (81 mg total) by mouth daily. 08/17/20   Costella, Darci Current, PA-C  Carboxymethylcellulose Sodium 1 % GEL Place 1  drop into both eyes 4 (four) times daily as needed (dry eyes).    [provider]  clonazePAM (KLONOPIN) 0.5 MG tablet Take 1 tablet (0.5 mg total) by mouth 2 (two) times daily. Patient taking differently: Take 1 mg by mouth every morning. 06/29/17   Burnard Leigh, MD  fluticasone (FLONASE) 50 MCG/ACT nasal spray Place 1 spray into both nostrils daily as needed for allergies or rhinitis.    [provider]  gabapentin (NEURONTIN) 100 MG capsule Take 100-200 mg by mouth See admin instructions. 100 mg in the morning, 200 mg at bedtime 01/01/17   [provider]  leflunomide (ARAVA) 10 MG tablet Take 10 mg by mouth daily.    [provider]  loratadine (CLARITIN) 10 MG tablet Take 10 mg by mouth daily.    [provider]  meclizine (ANTIVERT) 12.5 MG tablet Take 12.5-25 mg by mouth See admin instructions. 12.5 mg in the morning, 25 mg at bedtime    [provider]  metFORMIN (GLUCOPHAGE) 500 MG tablet Take 500 mg by mouth daily with breakfast.    [provider]  metoprolol tartrate (LOPRESSOR) 50 MG tablet TAKE 1 TABLET(50 MG) BY MOUTH TWICE DAILY Patient not taking: Reported on 07/19/2021 02/02/21   Tessa Lerner, DO  Multiple Vitamins-Minerals (CENTRUM SILVER 50+WOMEN) TABS Take 1 tablet by mouth daily.    [provider]  omeprazole (PRILOSEC) 20 MG capsule Take 20 mg by mouth daily.    [provider]  promethazine (PHENERGAN) 25 MG tablet Take 25 mg by mouth 2 (two) times daily as needed for nausea or vomiting.    [provider]  rosuvastatin (CRESTOR) 20 MG tablet Take 20 mg by mouth every evening.    [provider]  sertraline (ZOLOFT) 100 MG tablet Take 1 tablet (100 mg total) by mouth at bedtime. Patient taking differently: Take 200 mg by mouth at bedtime. 10/23/17   Eksir, Bo Mcclintock, MD  SUMAtriptan (IMITREX) 100 MG tablet Take 100 mg by mouth every 2 (two) hours as needed for migraine.  May repeat in 2 hours if headache persists or recurs.    [provider]  topiramate (TOPAMAX) 25 MG tablet TAKE ONE-HALF TABLET BY MOUTH ONCE A DAY AND TAKE ONE AND ONE-HALF TABLETS AT BEDTIME FOR 4 WEEKS, THEN TAKE ONE-HALF TABLET ONCE A DAY AND TAKE TWO TABLETS AT BEDTIME TO REDUCE MIGRAINE FREQUENCY/SEVERITY 10/15/20   [provider]  traZODone (DESYREL) 100 MG tablet Take 1 tablet (100 mg total) by mouth at bedtime. 06/29/17   Burnard Leigh, MD      Allergies    Oxycodone and Penicillins    Review of Systems   Review of Systems  Musculoskeletal:        Left leg pain  All other systems reviewed and are negative.   Physical Exam Updated Vital Signs BP (!) 141/103   Pulse 91   Temp 98.6 F (37 C) (Oral)   Resp 20   Wt 108.4 kg   SpO2 91%   BMI 38.58 kg/m  Physical Exam Vitals and nursing note reviewed.  Constitutional:      General: She is not in acute distress.    Appearance: Normal appearance.  HENT:     Head: Normocephalic and atraumatic.     Mouth/Throat:     Mouth: Mucous membranes are moist.  Eyes:     Conjunctiva/sclera: Conjunctivae normal.     Pupils: Pupils are equal, round, and reactive to light.  Cardiovascular:     Rate and Rhythm: Normal rate and regular rhythm.     Pulses: Normal pulses.     Heart sounds: Normal heart sounds.  Pulmonary:     Effort: Pulmonary effort is normal.     Breath sounds: Normal breath sounds.     Comments: Patient is wearing supplemental oxygen Abdominal:     Palpations: Abdomen is soft.     Tenderness: There is no abdominal tenderness.  Musculoskeletal:        General: Swelling and tenderness present.  Skin:    General: Skin is warm and dry.     Findings: No rash.  Neurological:     General: No focal deficit present.     Mental Status: She is alert.     Sensory: No sensory deficit.     Motor: No weakness.  Psychiatric:        Mood and Affect: Mood normal.        Behavior: Behavior normal.     ED Results / Procedures / Treatments   Labs (all labs ordered are listed, but only abnormal results are displayed) Labs Reviewed  CBC - Abnormal; Notable for the following components:      Result Value   RBC 3.81 (*)    Hemoglobin 10.8 (*)    HCT 34.3 (*)    All other components within normal limits  BASIC  METABOLIC PANEL - Abnormal; Notable for the following components:   Potassium 3.4 (*)    CO2 34 (*)    Glucose, Bld 100 (*)    All other components within normal limits    EKG None  Radiology CT Angio Chest PE W and/or Wo Contrast  Result Date: 03/14/2023 CLINICAL DATA:  Pulmonary embolus suspected with low to intermediate probability. Negative D-dimer. Left leg pain and swelling. Back surgery 1 week ago. EXAM: CT ANGIOGRAPHY CHEST WITH CONTRAST TECHNIQUE: Multidetector CT imaging of the chest was performed using the standard protocol during bolus administration of intravenous contrast. Multiplanar CT image reconstructions and MIPs were obtained to evaluate the vascular anatomy. RADIATION DOSE REDUCTION: This exam was performed according to the departmental dose-optimization program which includes automated exposure control, adjustment of the mA and/or kV according to patient size and/or use of iterative reconstruction technique. CONTRAST:  85mL OMNIPAQUE IOHEXOL 350 MG/ML SOLN COMPARISON:  Coronary CT 02/16/2021 FINDINGS: Cardiovascular: Examination is technically limited due to suboptimal contrast bolus and motion artifact. The visualized central and proximal segmental pulmonary arteries appear patent. No central pulmonary embolus is identified. Peripheral emboli could be obscured due to technique. Normal heart size. No pericardial effusions. Normal caliber thoracic aorta. No aortic dissection. Great vessel origins are patent. Mediastinum/Nodes: No enlarged mediastinal, hilar, or axillary lymph nodes. Thyroid gland, trachea, and esophagus demonstrate no significant findings.  Lungs/Pleura: Motion artifact limits examination. Patchy peripheral infiltrates with linear bandlike opacities in the right lower lung. Changes may represent multifocal pneumonia or atelectasis. There is suggestion of secretions in the right lower lobe bronchi. No pleural effusions. No pneumothorax. Upper Abdomen: No acute abnormalities. Musculoskeletal: Degenerative changes in the spine. No acute bony abnormalities. Review of the MIP images confirms the above findings. IMPRESSION: 1. Technically limited study but no large central pulmonary emboli are identified. 2. Patchy peripheral infiltrates in the lungs with linear bandlike opacities in the right lower lung, possibly multifocal pneumonia or atelectasis. Probable secretions in the right lower lobe bronchi. Electronically Signed   By: Burman Nieves M.D.   On: 03/14/2023 23:18   US Venous Img Lower Unilateral Left  Result Date: 03/14/2023 CLINICAL DATA:  Postoperative leg swelling. Left lower extremity pain and edema. Back surgery 1 week ago. EXAM: Left LOWER EXTREMITY VENOUS DOPPLER ULTRASOUND TECHNIQUE: Gray-scale sonography with compression, as well as color and duplex ultrasound, were performed to evaluate the deep venous system(s) from the level of the common femoral vein through the popliteal and proximal calf veins. COMPARISON:  None Available. FINDINGS: VENOUS Normal compressibility of the common femoral, superficial femoral, and popliteal veins, as well as the visualized calf veins. Visualized portions of profunda femoral vein and great saphenous vein unremarkable. No filling defects to suggest DVT on grayscale or color Doppler imaging. Doppler waveforms show normal direction of venous flow, normal respiratory plasticity and response to augmentation. Limited views of the contralateral common femoral vein are unremarkable. OTHER Popliteal cyst in the posterior fossa on the left measuring 3.2 x 1.2 x 1 cm. Limitations: none IMPRESSION: 1. No  evidence of acute deep venous thrombosis in the visualized lower extremity veins. 2. Left popliteal cyst. Electronically Signed   By: Burman Nieves M.D.   On: 03/14/2023 21:17    Procedures Procedures: not indicated.   Medications Ordered in ED Medications  oxyCODONE-acetaminophen (PERCOCET/ROXICET) 5-325 MG per tablet 1 tablet (1 tablet Oral Given 03/14/23 2150)  iohexol (OMNIPAQUE) 350 MG/ML injection 100 mL (85 mLs Intravenous Contrast Given 03/14/23 2220)  ED Course/ Medical Decision Making/ A&P                                 Medical Decision Making Amount and/or Complexity of Data Reviewed Labs: ordered. Radiology: ordered.  Risk Prescription drug management.   This patient presents to the ED for concern of left leg pain 1 week post-op, this involves an extensive number of treatment options, and is a complaint that carries with it a high risk of complications and morbidity.   Differential diagnosis includes: DVT, PE,   Comorbidities  See HPI above   Additional History  Additional history obtained from previous records.   Lab Tests  I ordered and personally interpreted labs.  The pertinent results include:   BMP and CBC are within normal limits - no acute abnormality, AKI, infection, or anemia.   Imaging Studies  I ordered imaging studies including DVT study and CTA PE study  I independently visualized and interpreted imaging which showed:  Left lower extremity ultrasound showed no evidence of acute DVT.  Left popliteal cyst. CTA showed no large central pulmonary embolism.  Patchy peripheral infiltrates in the lungs with linear bandlike opacities in the right lower lobe -could be in the multifocal pneumonia or atelectasis. I agree with the radiologist interpretation Since no elevated WBC, fever, cough, dyspnea, or chest pain - will not treat as pneumonia   Problem List / ED Course / Critical Interventions / Medication Management  Left leg pain I  ordered medications including: Percocet for pain  Reevaluation of the patient after these medicines showed that the patient improved I have reviewed the patients home medicines and have made adjustments as needed   Social Determinants of Health  Tobacco use   Test / Admission - Considered  Discussed findings with patient and family member at bedside. She is hemodynamically stable and safe for discharge home. Return precautions provided.       Final Clinical Impression(s) / ED Diagnoses Final diagnoses:  Left leg pain  Popliteal cyst, left    Rx / DC Orders ED Discharge Orders     None         Maxwell Marion, PA-C 03/15/23 0011    Elayne Snare K, DO 03/15/23 1453

## 2023-03-14 NOTE — ED Triage Notes (Signed)
Pt bib wheelchair, c/o LT leg pain and swelling. Back surgery x 1 week pta. Pt denies shob. Oxycodone at 1430

## 2023-03-14 NOTE — Discharge Instructions (Addendum)
Discussed, your imaging was negative for both DVT and pulmonary embolism.  Your ultrasound did not show a popliteal cyst behind your left knee.  This is a benign finding but I am giving information for orthopedics in case he will follow-up with them.  Follow-up with your PCP in 3 to 5 days for reevaluation of your symptoms.  Return to the ED if your symptoms worsen in the interim.

## 2023-04-02 DIAGNOSIS — M1712 Unilateral primary osteoarthritis, left knee: Secondary | ICD-10-CM | POA: Diagnosis not present

## 2023-04-03 DIAGNOSIS — M3501 Sicca syndrome with keratoconjunctivitis: Secondary | ICD-10-CM | POA: Diagnosis not present

## 2023-04-03 DIAGNOSIS — M25562 Pain in left knee: Secondary | ICD-10-CM | POA: Diagnosis not present

## 2023-04-03 DIAGNOSIS — E1169 Type 2 diabetes mellitus with other specified complication: Secondary | ICD-10-CM | POA: Diagnosis not present

## 2023-04-10 DIAGNOSIS — R531 Weakness: Secondary | ICD-10-CM | POA: Diagnosis not present

## 2023-04-16 DIAGNOSIS — M5416 Radiculopathy, lumbar region: Secondary | ICD-10-CM | POA: Diagnosis not present

## 2023-04-18 DIAGNOSIS — Z79899 Other long term (current) drug therapy: Secondary | ICD-10-CM | POA: Diagnosis not present

## 2023-04-18 DIAGNOSIS — M3500 Sicca syndrome, unspecified: Secondary | ICD-10-CM | POA: Diagnosis not present

## 2023-04-18 DIAGNOSIS — M79643 Pain in unspecified hand: Secondary | ICD-10-CM | POA: Diagnosis not present

## 2023-04-18 DIAGNOSIS — M0609 Rheumatoid arthritis without rheumatoid factor, multiple sites: Secondary | ICD-10-CM | POA: Diagnosis not present

## 2023-04-18 DIAGNOSIS — M1990 Unspecified osteoarthritis, unspecified site: Secondary | ICD-10-CM | POA: Diagnosis not present

## 2023-04-27 ENCOUNTER — Ambulatory Visit: Payer: Federal, State, Local not specified - PPO | Admitting: Physical Therapy

## 2023-04-27 NOTE — Therapy (Unsigned)
OUTPATIENT PHYSICAL THERAPY THORACOLUMBAR EVALUATION   Patient Name: Rebecca Hahn MRN: [id number] DOB:[birth year], 67 y.o., female Today's Date: 04/27/2023  END OF SESSION:   Past Medical History:  Diagnosis Date   Agenesis of gallbladder    Alopecia    Anemia    Anxiety    Cognitive dysfunction    Colon polyp    DDD (degenerative disc disease), lumbar    Depression    Diabetes mellitus, type II (HCC)    Gastric polyp    GERD (gastroesophageal reflux disease)    Glaucoma    Gout    Headache    migraines   Hearing loss    Hyperlipidemia    Hypertension    Lumbar radiculopathy    Lumbar radiculopathy    Morbid obesity (HCC)    Osteoarthritis    Sjoegren syndrome    Sleep apnea    CPAP   Vertigo    Past Surgical History:  Procedure Laterality Date   BACK SURGERY     CESAREAN SECTION     CHOLECYSTECTOMY     TUBAL LIGATION     Patient Active Problem List   Diagnosis Date Noted   Precordial pain    Lumbar spinal stenosis 08/10/2020    PCP: Georgianne Fick, MD  REFERRING PROVIDER: Lisbeth Renshaw, MD  REFERRING DIAG: [id number] (ICD-10-CM) - Spinal stenosis, lumbar region with neurogenic claudication  Rationale for Evaluation and Treatment: Rehabilitation  THERAPY DIAG:  No diagnosis found.  ONSET DATE: ***  SUBJECTIVE:                                                                                                                                                                                           SUBJECTIVE STATEMENT: ***  PERTINENT HISTORY:  Rebecca Hahn is a 67 y.o. female with a history of Sjogren syndrome, diabetes, lumbar spine stenosis, and vertigo who presents the ED today for leg pain.  Patient reports pain to the whole left leg that began this morning with swelling at the knee.  Patient had surgery on L2 and L3 last week.   PAIN:  Are you having pain? {OPRCPAIN:27236}  PRECAUTIONS: {Therapy precautions:24002}  RED  FLAGS: {PT Red Flags:29287}   WEIGHT BEARING RESTRICTIONS: {Yes ***/No:24003}  FALLS:  Has patient fallen in last 6 months? {fallsyesno:27318}  LIVING ENVIRONMENT: Lives with: {OPRC lives with:25569::"lives with their family"} Lives in: {Lives in:25570} Stairs: {opstairs:27293} Has following equipment at home: {Assistive devices:23999}  OCCUPATION: ***  PLOF: {PLOF:24004}  PATIENT GOALS: ***  NEXT MD VISIT: ***  OBJECTIVE:  Note: Objective measures were completed at Evaluation unless otherwise noted.  DIAGNOSTIC FINDINGS:  Lumbar MRI 12/02/22 IMPRESSION: 1. At L2-L3, progressive disc bulge with new left paracentral disc protrusion which results and moderate canal stenosis and left greater than right subarticular recess stenosis. Similar moderate left foraminal stenosis. 2. At L3-L4, mildly progressive mild to moderate canal stenosis and mild left foraminal stenosis. 3. L4-L5 PLIF.  NCV & EMG Findings: Extensive electrodiagnostic testing of the right lower extremity shows:  Right sural and superficial peroneal sensory responses are within normal limits. Right peroneal and tibial motor responses are within normal limits. Right tibial H reflex study is within normal limits. Chronic motor axonal loss changes are seen affecting the right L2-3 myotome, without accompanied active denervation.   Impression: Chronic L2-3 radiculopathy affecting the right lower extremity, mild. There is no evidence of a large fiber sensorimotor polyneuropathy affecting the right lower extremity.  PATIENT SURVEYS:  Modified Oswestry ***   SCREENING FOR RED FLAGS: Bowel or bladder incontinence: {Yes/No:304960894} Spinal tumors: {Yes/No:304960894} Cauda equina syndrome: {Yes/No:304960894} Compression fracture: {Yes/No:304960894} Abdominal aneurysm: {Yes/No:304960894}  COGNITION: Overall cognitive status: {cognition:24006}     SENSATION: {sensation:27233}  MUSCLE LENGTH: Hamstrings:  Right *** deg; Left *** deg Thomas test: Right *** deg; Left *** deg  POSTURE: {posture:25561}  PALPATION: ***  LUMBAR ROM:   AROM eval  Flexion   Extension   Right lateral flexion   Left lateral flexion   Right rotation   Left rotation    (Blank rows = not tested)  LOWER EXTREMITY ROM:     {AROM/PROM:27142}  Right eval Left eval  Hip flexion    Hip extension    Hip abduction    Hip adduction    Hip internal rotation    Hip external rotation    Knee flexion    Knee extension    Ankle dorsiflexion    Ankle plantarflexion    Ankle inversion    Ankle eversion     (Blank rows = not tested)  LOWER EXTREMITY MMT:    MMT Right eval Left eval  Hip flexion    Hip extension    Hip abduction    Hip adduction    Hip internal rotation    Hip external rotation    Knee flexion    Knee extension    Ankle dorsiflexion    Ankle plantarflexion    Ankle inversion    Ankle eversion     (Blank rows = not tested)  LUMBAR SPECIAL TESTS:  {lumbar special test:25242}  FUNCTIONAL TESTS:  {Functional tests:24029}  GAIT: Distance walked: *** Assistive device utilized: {Assistive devices:23999} Level of assistance: {Levels of assistance:24026} Comments: ***  TODAY'S TREATMENT:                                                                                                                              DATE: ***    PATIENT EDUCATION:  Education details: *** Person educated: {Person educated:25204} Education method: {Education Method:25205} Education comprehension: {Education Comprehension:25206}  HOME EXERCISE PROGRAM: ***  ASSESSMENT:  CLINICAL IMPRESSION: Patient is a *** y.o. *** who was seen today for physical therapy evaluation and treatment for ***.   OBJECTIVE IMPAIRMENTS: {opptimpairments:25111}.   ACTIVITY LIMITATIONS: {activitylimitations:27494}  PARTICIPATION LIMITATIONS: {participationrestrictions:25113}  PERSONAL FACTORS: {Personal  factors:25162} are also affecting patient's functional outcome.   REHAB POTENTIAL: {rehabpotential:25112}  CLINICAL DECISION MAKING: {clinical decision making:25114}  EVALUATION COMPLEXITY: {Evaluation complexity:25115}   GOALS: Goals reviewed with patient? {yes/no:20286}  SHORT TERM GOALS: Target date: ***  *** Baseline: Goal status: INITIAL  2.  *** Baseline:  Goal status: INITIAL  3.  *** Baseline:  Goal status: INITIAL  4.  *** Baseline:  Goal status: INITIAL  5.  *** Baseline:  Goal status: INITIAL  6.  *** Baseline:  Goal status: INITIAL  LONG TERM GOALS: Target date: ***  *** Baseline:  Goal status: INITIAL  2.  *** Baseline:  Goal status: INITIAL  3.  *** Baseline:  Goal status: INITIAL  4.  *** Baseline:  Goal status: INITIAL  5.  *** Baseline:  Goal status: INITIAL  6.  *** Baseline:  Goal status: INITIAL  PLAN:  PT FREQUENCY: {rehab frequency:25116}  PT DURATION: {rehab duration:25117}  PLANNED INTERVENTIONS: {rehab planned interventions:25118::"97110-Therapeutic exercises","97530- Therapeutic [id number]- Neuromuscular re-education","97535- Self NGEX","52841- Manual therapy"}.  PLAN FOR NEXT SESSION: ***   Shannan Slinker April Ma L Deshon Hsiao, PT 04/27/2023, 7:57 AM

## 2023-05-01 DIAGNOSIS — M3500 Sicca syndrome, unspecified: Secondary | ICD-10-CM | POA: Diagnosis not present

## 2023-05-01 DIAGNOSIS — F32A Depression, unspecified: Secondary | ICD-10-CM | POA: Diagnosis not present

## 2023-05-01 DIAGNOSIS — E119 Type 2 diabetes mellitus without complications: Secondary | ICD-10-CM | POA: Diagnosis not present

## 2023-05-01 DIAGNOSIS — I10 Essential (primary) hypertension: Secondary | ICD-10-CM | POA: Diagnosis not present

## 2023-05-02 ENCOUNTER — Ambulatory Visit: Payer: Medicare Other

## 2023-05-07 DIAGNOSIS — G4733 Obstructive sleep apnea (adult) (pediatric): Secondary | ICD-10-CM | POA: Diagnosis not present

## 2023-05-07 DIAGNOSIS — E669 Obesity, unspecified: Secondary | ICD-10-CM | POA: Diagnosis not present

## 2023-05-08 DIAGNOSIS — M0609 Rheumatoid arthritis without rheumatoid factor, multiple sites: Secondary | ICD-10-CM | POA: Diagnosis not present

## 2023-05-14 DIAGNOSIS — L700 Acne vulgaris: Secondary | ICD-10-CM | POA: Diagnosis not present

## 2023-05-18 ENCOUNTER — Ambulatory Visit: Payer: No Typology Code available for payment source | Attending: Neurosurgery

## 2023-05-18 DIAGNOSIS — M6281 Muscle weakness (generalized): Secondary | ICD-10-CM | POA: Diagnosis not present

## 2023-05-18 DIAGNOSIS — R2681 Unsteadiness on feet: Secondary | ICD-10-CM | POA: Insufficient documentation

## 2023-05-18 DIAGNOSIS — R293 Abnormal posture: Secondary | ICD-10-CM | POA: Diagnosis present

## 2023-05-18 NOTE — Therapy (Signed)
OUTPATIENT PHYSICAL THERAPY THORACOLUMBAR EVALUATION   Patient Name: Rebecca Hahn MRN: [id number] DOB:1955/10/07, 67 y.o., female Today's Date: 05/18/2023  END OF SESSION:  PT End of Session - 05/18/23 1304     Visit Number 1    Number of Visits 1    Authorization Type [facility]    Progress Note Due on Visit 10    PT Start Time 1315    PT Stop Time 1353    PT Time Calculation (min) 38 min    Activity Tolerance Patient tolerated treatment well;Patient limited by pain    Behavior During Therapy WFL for tasks assessed/performed             Past Medical History:  Diagnosis Date   Agenesis of gallbladder    Alopecia    Anemia    Anxiety    Cognitive dysfunction    Colon polyp    DDD (degenerative disc disease), lumbar    Depression    Diabetes mellitus, type II (HCC)    Gastric polyp    GERD (gastroesophageal reflux disease)    Glaucoma    Gout    Headache    migraines   Hearing loss    Hyperlipidemia    Hypertension    Lumbar radiculopathy    Lumbar radiculopathy    Morbid obesity (HCC)    Osteoarthritis    Sjoegren syndrome    Sleep apnea    CPAP   Vertigo    Past Surgical History:  Procedure Laterality Date   BACK SURGERY     CESAREAN SECTION     CHOLECYSTECTOMY     TUBAL LIGATION     Patient Active Problem List   Diagnosis Date Noted   Precordial pain    Lumbar spinal stenosis 08/10/2020    PCP: Rebecca Fick, MD  REFERRING PROVIDER: Lisbeth Renshaw, MD  REFERRING DIAG: [phone] (ICD-10-CM) - Spinal stenosis, lumbar region with neurogenic claudication  Rationale for Evaluation and Treatment: Rehabilitation  THERAPY DIAG:  Muscle weakness (generalized) - Plan: PT plan of care cert/re-cert  Unsteadiness on feet - Plan: PT plan of care cert/re-cert  Abnormal posture - Plan: PT plan of care cert/re-cert  ONSET DATE: October 2024  SUBJECTIVE:                                                                                                                                                                                            SUBJECTIVE STATEMENT: Patient arrives to clinic alone, using bariatric rollator. Has back sx in October and "chose" to not do PT at that time. Developed a "cyst" in her L lower leg that resulted  in some swelling and general immobility. Admits to being rather sedentary. Does have exercises from previous bout of PT, but has slowly stopped doing them.   PERTINENT HISTORY:  Rebecca Hahn is a 67 y.o. female with a history of Sjogren syndrome, diabetes, lumbar spine stenosis, and vertigo who presents the ED today for leg pain.  Patient reports pain to the whole left leg that began this morning with swelling at the knee.  Patient had surgery on L2 and L3 last week.   PAIN:  Are you having pain? Yes: NPRS scale: 5/10 Pain location: primarily L side Pain description: "jolt" Aggravating factors: moving, sitting for too long Relieving factors: rest, gentle movement  PRECAUTIONS: Fall and Other: recent lumbar surgery   WEIGHT BEARING RESTRICTIONS: No  FALLS:  Has patient fallen in last 6 months? Yes. Number of falls 1; reaching lost balance and fell backwards    PLOF: Independent  PATIENT GOALS: "to stretch me"  OBJECTIVE:  Note: Objective measures were completed at Evaluation unless otherwise noted.  DIAGNOSTIC FINDINGS:  Lumbar MRI 12/02/22 IMPRESSION: 1. At L2-L3, progressive disc bulge with new left paracentral disc protrusion which results and moderate canal stenosis and left greater than right subarticular recess stenosis. Similar moderate left foraminal stenosis. 2. At L3-L4, mildly progressive mild to moderate canal stenosis and mild left foraminal stenosis. 3. L4-L5 PLIF.  NCV & EMG Findings: Extensive electrodiagnostic testing of the right lower extremity shows:  Right sural and superficial peroneal sensory responses are within normal limits. Right peroneal and tibial motor  responses are within normal limits. Right tibial H reflex study is within normal limits. Chronic motor axonal loss changes are seen affecting the right L2-3 myotome, without accompanied active denervation.   Impression: Chronic L2-3 radiculopathy affecting the right lower extremity, mild. There is no evidence of a large fiber sensorimotor polyneuropathy affecting the right lower extremity.  PATIENT SURVEYS:  Modified Oswestry 27   COGNITION: Overall cognitive status: Within functional limits for tasks assessed     SENSATION: Fluctuating N/T in B LE  POSTURE: rounded shoulders, forward head, increased thoracic kyphosis, posterior pelvic tilt, and flexed trunk    GAIT: Distance walked: clinic Assistive device utilized: Environmental consultant - 4 wheeled Level of assistance: Modified independence Comments: slow gait speed, guarded  TODAY'S TREATMENT:                                                                                                                              -HEP see below -education on importance of movement in regards to OA/RA   PATIENT EDUCATION:  Education details: PT POC, exam findings, HEP, importance of mobility Person educated: Patient Education method: Explanation and Handouts Education comprehension: verbalized understanding  HOME EXERCISE PROGRAM: Access Code: [facility] URL: [email or web address] Date: 05/18/2023 Prepared by: Merry Lofty  Exercises - Supine Lower Trunk Rotation  - 1 x daily - 7 x weekly - 3 sets - 10 reps - Supine Double Knee to  Chest Modified  - 1 x daily - 7 x weekly - 3 sets - 30s hold - Hooklying Single Knee to Chest Stretch  - 1 x daily - 7 x weekly - 3 sets - 30s hold - Supine Posterior Pelvic Tilt  - 1 x daily - 7 x weekly - 3 sets - 10 reps - Supine March  - 1 x daily - 7 x weekly - 3 sets - 10 reps - Supine Piriformis Stretch with Foot on Ground  - 1 x daily - 7 x weekly - 3 sets - 10 reps - Cat Cow  - 1 x daily - 7 x  weekly - 3 sets - 10 reps - Seated Hamstring Stretch  - 1 x daily - 7 x weekly - 3 sets - 30s hold - Seated Quadratus Lumborum Stretch in Chair  - 1 x daily - 7 x weekly - 3 sets - 30s hold - Seated Slump Nerve Glide  - 1 x daily - 7 x weekly - 3 sets - 10 reps  ASSESSMENT:  CLINICAL IMPRESSION: Patient is a 67 y.o. female who was seen today for physical therapy evaluation and treatment for LBP. She semi-recently had lumbar sx, which offered her some relief on pain, but now notices if she doesn't move for some time and then goes to move, she experiences some pain. Extensive education on importance of movement in regards to pathophys of arthritis. She scored a 28 on the oswestry indicating a high level of disability related to her back pain. PT prescribing an extensive HEP including strengthening and stretching to be completed. Patient reports that previously provided HEP was helpful, but she stopped doing it and noticed a backslide in her progress. Patient does not wish to participate in PT at this time as she doesn't want to have to come into the clinic.    CLINICAL DECISION MAKING: Stable/uncomplicated  EVALUATION COMPLEXITY: Low   GOALS: Not indicated as patient does not wish to participate in PT at this time  PLAN:  PT FREQUENCY: one time visit  PT DURATION: other: 1x visit    Westley Foots, PT Westley Foots, PT, DPT, CBIS  05/18/2023, 2:00 PM

## 2023-05-22 DIAGNOSIS — M0609 Rheumatoid arthritis without rheumatoid factor, multiple sites: Secondary | ICD-10-CM | POA: Diagnosis not present

## 2023-05-24 ENCOUNTER — Ambulatory Visit: Payer: Federal, State, Local not specified - PPO | Admitting: Family

## 2023-05-28 DIAGNOSIS — M1712 Unilateral primary osteoarthritis, left knee: Secondary | ICD-10-CM | POA: Diagnosis not present

## 2023-05-28 DIAGNOSIS — M5416 Radiculopathy, lumbar region: Secondary | ICD-10-CM | POA: Diagnosis not present

## 2023-05-30 ENCOUNTER — Ambulatory Visit: Payer: No Typology Code available for payment source | Attending: Neurosurgery

## 2023-05-30 DIAGNOSIS — M6281 Muscle weakness (generalized): Secondary | ICD-10-CM | POA: Diagnosis not present

## 2023-05-30 DIAGNOSIS — R2681 Unsteadiness on feet: Secondary | ICD-10-CM | POA: Insufficient documentation

## 2023-05-30 DIAGNOSIS — R293 Abnormal posture: Secondary | ICD-10-CM | POA: Diagnosis not present

## 2023-05-30 NOTE — Therapy (Signed)
 OUTPATIENT PHYSICAL THERAPY NEURO EVALUATION   Patient Name: Rebecca Hahn MRN: [id number] DOB:[birth year], 68 y.o., female Today's Date: 05/30/2023   PCP: Lequita Flor, MD REFERRING PROVIDER: Gerldine Maizes, MD  END OF SESSION:  PT End of Session - 05/30/23 1359     Visit Number 1    Number of Visits 9    Date for PT Re-Evaluation 07/27/23    Authorization Type [facility]    Progress Note Due on Visit 10    PT Start Time 1400    PT Stop Time 1443    PT Time Calculation (min) 43 min    Activity Tolerance Patient tolerated treatment well;Patient limited by pain    Behavior During Therapy WFL for tasks assessed/performed             Past Medical History:  Diagnosis Date   Agenesis of gallbladder    Alopecia    Anemia    Anxiety    Cognitive dysfunction    Colon polyp    DDD (degenerative disc disease), lumbar    Depression    Diabetes mellitus, type II (HCC)    Gastric polyp    GERD (gastroesophageal reflux disease)    Glaucoma    Gout    Headache    migraines   Hearing loss    Hyperlipidemia    Hypertension    Lumbar radiculopathy    Lumbar radiculopathy    Morbid obesity (HCC)    Osteoarthritis    Sjoegren syndrome    Sleep apnea    CPAP   Vertigo    Past Surgical History:  Procedure Laterality Date   BACK SURGERY     CESAREAN SECTION     CHOLECYSTECTOMY     TUBAL LIGATION     Patient Active Problem List   Diagnosis Date Noted   Precordial pain    Lumbar spinal stenosis 08/10/2020    ONSET DATE: 05/29/23  REFERRING DIAG: F51.937 (ICD-10-CM) - Spinal stenosis, lumbar region with neurogenic claudication   THERAPY DIAG:  Muscle weakness (generalized) - Plan: PT plan of care cert/re-cert  Unsteadiness on feet - Plan: PT plan of care cert/re-cert  Abnormal posture - Plan: PT plan of care cert/re-cert  Rationale for Evaluation and Treatment: Rehabilitation  SUBJECTIVE:                                                                                                                                                                                              SUBJECTIVE STATEMENT: Patient arrives to clinic with rollator. She uses it essentially all the time. Will walk short distances with out, but ends up  flexed. L LE swells and has pain more than R. Denies falls.  Pt accompanied by: self  PERTINENT HISTORY: anemia, anxiety, arthritis, depression, DM, GERD, glaucoma, OSA, HLD, HTN, lumbar radiculopathy   PAIN:  Are you having pain? Yes: NPRS scale: 7/10 Pain location: low back, L leg Pain description: shooting Aggravating factors: moving a lot, sitting in 1 place for too long Relieving factors: elevation, rest  PRECAUTIONS: Fall  RED FLAGS: None   WEIGHT BEARING RESTRICTIONS: No  FALLS: Has patient fallen in last 6 months? Yes. Number of falls 1; stepped on something and lost balance backwards  PATIENT GOALS: help me with the stretches  OBJECTIVE:  Note: Objective measures were completed at Evaluation unless otherwise noted.  DIAGNOSTIC FINDINGS: Lumbar MRI 12/02/22 IMPRESSION: 1. At L2-L3, progressive disc bulge with new left paracentral disc protrusion which results and moderate canal stenosis and left greater than right subarticular recess stenosis. Similar moderate left foraminal stenosis. 2. At L3-L4, mildly progressive mild to moderate canal stenosis and mild left foraminal stenosis. 3. L4-L5 PLIF.  COGNITION: Overall cognitive status: Within functional limits for tasks assessed   SENSATION: WFL  COORDINATION: Slow, decreased ROM with L LE with heel/shin and figure 8  EDEMA:  Mild 1+ L LE    POSTURE: No Significant postural limitations  LOWER EXTREMITY MMT:    MMT Right Eval Left Eval  Hip flexion 3+ 3-  Hip extension    Hip abduction 4 4-  Hip adduction 4- 4-  Hip internal rotation    Hip external rotation    Knee flexion 3+ 3+  Knee extension 4- 4  Ankle dorsiflexion 4 4  Ankle  plantarflexion    Ankle inversion    Ankle eversion    (Blank rows = not tested)  BED MOBILITY:  Reports difficulty but ultimately independent   TRANSFERS: Assistive device utilized: Environmental Consultant - 4 wheeled  Sit to stand: Modified independence Stand to sit: Modified independence Chair to chair: Modified independence   GAIT: Gait pattern: step through pattern, decreased stride length, Right foot flat, Left foot flat, antalgic, trendelenburg, trunk flexed, and wide BOS Distance walked: clinic Assistive device utilized: Environmental Consultant - 4 wheeled Level of assistance: Modified independence   FUNCTIONAL TESTS:   [facility] PT Assessment - 05/30/23 0001       Standardized Balance Assessment   Standardized Balance Assessment Timed Up and Go Test    Five times sit to stand comments  35.28s B UE support + CGA    10 Meter Walk .38m/s      Timed Up and Go Test   Normal TUG (seconds) 25.69   with rollator             PATIENT SURVEYS:  Modified Oswestry 23 (46%)                                                                                                                               TREATMENT N/a eval  PATIENT EDUCATION: Education details: PT POC, exam findings Person educated: Patient Education method: Explanation Education comprehension: verbalized understanding and needs further education  HOME EXERCISE PROGRAM: LEEMCEXM   GOALS: Goals reviewed with patient? Yes  SHORT TERM GOALS: Target date: 06/29/23  Pt will be independent with initial HEP for improved functional strength and endurance  Baseline: to be updated Goal status: INITIAL  2.  Pt will improve 5x STS to </= 28 sec to demo improved functional LE strength and balance   Baseline: 35.28s B UE + CGA Goal status: INITIAL  3.  Pt will improve gait speed to >/= .18m/s to demonstrate improved community ambulation  Baseline: .64m/s Goal status: INITIAL  4.  Pt will improve TUG to </= 20 secs to demonstrated reduced  fall risk  Baseline: 25.69s with RW Goal status: INITIAL    LONG TERM GOALS: Target date: 07/27/23  Pt will be independent with final HEP for improved functional strength and endurance  Baseline: to be updated Goal status: INITIAL  Pt will improve 5x STS to </= 20 sec to demo improved functional LE strength and balance   Baseline: 35.28s B UE + CGA Goal status: INITIAL  Pt will improve gait speed to >/= .83m/s to demonstrate improved community ambulation  Baseline: .38m/s Goal status: INITIAL  Pt will improve TUG to </= 15 secs to demonstrated reduced fall risk  Baseline: 25.69s with RW Goal status: INITIAL  ASSESSMENT:  CLINICAL IMPRESSION: Patient is a 68 y.o. female who was seen today for physical therapy evaluation and treatment for impaired mobility related to LBP. Patient has an extensive history of this and now uses a rollator for all mobility and continues to have significant, debilitating pain. Patient completed the Timed Up and Go test (TUG) in 25.69 seconds.  Geriatrics: need for further assessment of fall risk: >/= 12 sec; Recurrent falls: > 15 sec; Vestibular Disorders fall risk: > 15 sec; Parkinson's Disease fall risk: > 16 sec ([email or web address], 2023). 10 Meter Walk Test: Patient instructed to walk 10 meters (32.8 ft) as quickly and as safely as possible at their normal speed x2 and at a fast speed x2. Time measured from 2 meter mark to 8 meter mark to accommodate ramp-up and ramp-down.  Normal speed: .46m/s Cut off scores: <0.4 m/s = household Ambulator, 0.4-0.8 m/s = limited community Ambulator, >0.8 m/s = community Ambulator, >1.2 m/s = crossing a street, <1.0 = increased fall risk MCID 0.05 m/s (small), 0.13 m/s (moderate), 0.06 m/s (significant)  (ANPTA Core Set of Outcome Measures for Adults with Neurologic Conditions, 2018). Five times Sit to Stand Test (FTSS) Method: Use a straight back chair with a solid seat that is 17-18" high. Ask participant to sit on the  chair with arms folded across their chest.   Instructions: "Stand up and sit down as quickly as possible 5 times, keeping your arms folded across your chest."   Measurement: Stop timing when the participant touches the chair in sitting the 5th time.  TIME: 35.28 sec  Cut off scores indicative of increased fall risk: >12 sec CVA, >16 sec PD, >13 sec vestibular (ANPTA Core Set of Outcome Measures for Adults with Neurologic Conditions, 2018). She would benefit from skilled PT services to address the above mentioned deficits.    OBJECTIVE IMPAIRMENTS: Abnormal gait, cardiopulmonary status limiting activity, decreased activity tolerance, decreased coordination, decreased endurance, decreased knowledge of condition, decreased mobility, difficulty walking, decreased strength, postural dysfunction, and pain.   ACTIVITY LIMITATIONS: carrying, lifting, standing, squatting, stairs,  locomotion level, and caring for others  PARTICIPATION LIMITATIONS: meal prep, cleaning, interpersonal relationship, driving, shopping, and community activity  PERSONAL FACTORS: Fitness, Past/current experiences, Time since onset of injury/illness/exacerbation, Transportation, and 3+ comorbidities: see above  are also affecting patient's functional outcome.   REHAB POTENTIAL: Fair time since onset  CLINICAL DECISION MAKING: Stable/uncomplicated  EVALUATION COMPLEXITY: Low  PLAN:  PT FREQUENCY: 1x/week  PT DURATION: 8 weeks  PLANNED INTERVENTIONS: 97164- PT Re-evaluation, 97110-Therapeutic exercises, 97530- Therapeutic activity, 97112- Neuromuscular re-education, [phone]- Self Care, 02859- Manual therapy, [phone]- Gait training, [phone]- Orthotic Fit/training, [phone]- Aquatic Therapy, Patient/Family education, Balance training, Stair training, Dry Needling, Joint mobilization, Spinal mobilization, Vestibular training, Visual/preceptual remediation/compensation, DME instructions, Cryotherapy, and Moist heat  PLAN FOR NEXT  SESSION: HEP, functional strength, endurance   Delon DELENA Pop, PT Delon DELENA Pop, PT, DPT, CBIS  05/30/2023, 3:07 PM

## 2023-06-07 ENCOUNTER — Ambulatory Visit: Payer: No Typology Code available for payment source | Admitting: Physical Therapy

## 2023-06-07 ENCOUNTER — Ambulatory Visit: Payer: No Typology Code available for payment source

## 2023-06-07 DIAGNOSIS — M6281 Muscle weakness (generalized): Secondary | ICD-10-CM

## 2023-06-07 DIAGNOSIS — R2681 Unsteadiness on feet: Secondary | ICD-10-CM

## 2023-06-07 DIAGNOSIS — R293 Abnormal posture: Secondary | ICD-10-CM

## 2023-06-07 NOTE — Therapy (Signed)
OUTPATIENT PHYSICAL THERAPY NEURO TREATMENT   Patient Name: Rebecca Hahn MRN: [id number] DOB:[birth year], 68 y.o., female Today's Date: 06/07/2023   PCP: Georgianne Fick, MD REFERRING PROVIDER: Lisbeth Renshaw, MD  END OF SESSION:  PT End of Session - 06/07/23 1312     Visit Number 2    Number of Visits 9    Date for PT Re-Evaluation 07/27/23    Authorization Type [facility]    Progress Note Due on Visit 10    PT Start Time 1320   patient in bathroom   PT Stop Time 1359    PT Time Calculation (min) 39 min    Activity Tolerance Patient tolerated treatment well    Behavior During Therapy WFL for tasks assessed/performed             Past Medical History:  Diagnosis Date   Agenesis of gallbladder    Alopecia    Anemia    Anxiety    Cognitive dysfunction    Colon polyp    DDD (degenerative disc disease), lumbar    Depression    Diabetes mellitus, type II (HCC)    Gastric polyp    GERD (gastroesophageal reflux disease)    Glaucoma    Gout    Headache    migraines   Hearing loss    Hyperlipidemia    Hypertension    Lumbar radiculopathy    Lumbar radiculopathy    Morbid obesity (HCC)    Osteoarthritis    Sjoegren syndrome    Sleep apnea    CPAP   Vertigo    Past Surgical History:  Procedure Laterality Date   BACK SURGERY     CESAREAN SECTION     CHOLECYSTECTOMY     TUBAL LIGATION     Patient Active Problem List   Diagnosis Date Noted   Precordial pain    Lumbar spinal stenosis 08/10/2020    ONSET DATE: 05/29/23  REFERRING DIAG: H08.657 (ICD-10-CM) - Spinal stenosis, lumbar region with neurogenic claudication   THERAPY DIAG:  Muscle weakness (generalized)  Unsteadiness on feet  Abnormal posture  Rationale for Evaluation and Treatment: Rehabilitation  SUBJECTIVE:                                                                                                                                                                                              SUBJECTIVE STATEMENT: Patient arrives to clinic with rollator. States she has been walking at home with her [facility] and without an AD, roughly 20-33ft. Reporting less pain in L LE. Denies falls.  Pt accompanied by: self  PERTINENT HISTORY: anemia, anxiety, arthritis, depression,  DM, GERD, glaucoma, OSA, HLD, HTN, lumbar radiculopathy   PAIN:  Are you having pain? Yes: NPRS scale: 5/10 Pain location: low back, L leg Pain description: shooting Aggravating factors: moving a lot, sitting in 1 place for too long Relieving factors: elevation, rest  PRECAUTIONS: Fall  PATIENT GOALS: "help me with the stretches"  OBJECTIVE:  Note: Objective measures were completed at Evaluation unless otherwise noted.  DIAGNOSTIC FINDINGS: Lumbar MRI 12/02/22 IMPRESSION: 1. At L2-L3, progressive disc bulge with new left paracentral disc protrusion which results and moderate canal stenosis and left greater than right subarticular recess stenosis. Similar moderate left foraminal stenosis. 2. At L3-L4, mildly progressive mild to moderate canal stenosis and mild left foraminal stenosis. 3. L4-L5 PLIF.                                                                                                                               TREATMENT -spent time organizing patients PT schedule and discussed auth time frame -anterior ball roll out -> bias one side for low back stretch -iso core on physioball  -scifit hills level 2 x10 mins BUE/LE for improved CV endurance and strength    PATIENT EDUCATION: Education details: PT POC, continue HEP Person educated: Patient Education method: Explanation Education comprehension: verbalized understanding and needs further education  HOME EXERCISE PROGRAM: LEEMCEXM   GOALS: Goals reviewed with patient? Yes  SHORT TERM GOALS: Target date: 06/29/23  Pt will be independent with initial HEP for improved functional strength and endurance  Baseline: to be updated Goal  status: INITIAL  2.  Pt will improve 5x STS to </= 28 sec to demo improved functional LE strength and balance   Baseline: 35.28s B UE + CGA Goal status: INITIAL  3.  Pt will improve gait speed to >/= .70m/s to demonstrate improved community ambulation  Baseline: .90m/s Goal status: INITIAL  4.  Pt will improve TUG to </= 20 secs to demonstrated reduced fall risk  Baseline: 25.69s with RW Goal status: INITIAL    LONG TERM GOALS: Target date: 07/27/23  Pt will be independent with final HEP for improved functional strength and endurance  Baseline: to be updated Goal status: INITIAL  Pt will improve 5x STS to </= 20 sec to demo improved functional LE strength and balance   Baseline: 35.28s B UE + CGA Goal status: INITIAL  Pt will improve gait speed to >/= .104m/s to demonstrate improved community ambulation  Baseline: .20m/s Goal status: INITIAL  Pt will improve TUG to </= 15 secs to demonstrated reduced fall risk  Baseline: 25.69s with RW Goal status: INITIAL  ASSESSMENT:  CLINICAL IMPRESSION: Patient seen for skilled PT session with emphasis on low back stability and mobility. Significant time spent organizing patient schedule and discussing auth period. Discussed importance of maintaining mobility. Patient agreeable to dc next appt due to auth period and want to have option to pursue PT at retirement facility. Continue POC.  OBJECTIVE IMPAIRMENTS: Abnormal gait, cardiopulmonary status limiting activity, decreased activity tolerance, decreased coordination, decreased endurance, decreased knowledge of condition, decreased mobility, difficulty walking, decreased strength, postural dysfunction, and pain.   ACTIVITY LIMITATIONS: carrying, lifting, standing, squatting, stairs, locomotion level, and caring for others  PARTICIPATION LIMITATIONS: meal prep, cleaning, interpersonal relationship, driving, shopping, and community activity  PERSONAL FACTORS: Fitness, Past/current  experiences, Time since onset of injury/illness/exacerbation, Transportation, and 3+ comorbidities: see above  are also affecting patient's functional outcome.   REHAB POTENTIAL: Fair time since onset  CLINICAL DECISION MAKING: Stable/uncomplicated  EVALUATION COMPLEXITY: Low  PLAN:  PT FREQUENCY: 1x/week  PT DURATION: 8 weeks  PLANNED INTERVENTIONS: 97164- PT Re-evaluation, 97110-Therapeutic exercises, 97530- Therapeutic activity, 97112- Neuromuscular re-education, 97535- Self Care, 96295- Manual therapy, [phone]- Gait training, [phone]- Orthotic Fit/training, [phone]- Aquatic Therapy, Patient/Family education, Balance training, Stair training, Dry Needling, Joint mobilization, Spinal mobilization, Vestibular training, Visual/preceptual remediation/compensation, DME instructions, Cryotherapy, and Moist heat  PLAN FOR NEXT SESSION: check goals and dc   Westley Foots, PT Westley Foots, PT, DPT, CBIS  06/07/2023, 2:17 PM

## 2023-06-11 DIAGNOSIS — M25562 Pain in left knee: Secondary | ICD-10-CM | POA: Diagnosis not present

## 2023-06-11 DIAGNOSIS — M0579 Rheumatoid arthritis with rheumatoid factor of multiple sites without organ or systems involvement: Secondary | ICD-10-CM | POA: Diagnosis not present

## 2023-06-14 ENCOUNTER — Ambulatory Visit: Payer: No Typology Code available for payment source | Admitting: Physical Therapy

## 2023-06-14 ENCOUNTER — Ambulatory Visit: Payer: Medicare Other | Admitting: Physical Therapy

## 2023-06-18 DIAGNOSIS — M25562 Pain in left knee: Secondary | ICD-10-CM | POA: Diagnosis not present

## 2023-06-21 ENCOUNTER — Ambulatory Visit: Payer: Medicare Other

## 2023-06-21 ENCOUNTER — Ambulatory Visit: Payer: Medicare Other | Admitting: Physical Therapy

## 2023-06-28 ENCOUNTER — Ambulatory Visit: Payer: Medicare Other | Admitting: Physical Therapy

## 2023-06-29 DIAGNOSIS — M545 Low back pain, unspecified: Secondary | ICD-10-CM | POA: Diagnosis not present

## 2023-06-29 DIAGNOSIS — R2689 Other abnormalities of gait and mobility: Secondary | ICD-10-CM | POA: Diagnosis not present

## 2023-06-29 DIAGNOSIS — R531 Weakness: Secondary | ICD-10-CM | POA: Diagnosis not present

## 2023-07-03 DIAGNOSIS — M3501 Sicca syndrome with keratoconjunctivitis: Secondary | ICD-10-CM | POA: Diagnosis not present

## 2023-07-03 DIAGNOSIS — M3500 Sicca syndrome, unspecified: Secondary | ICD-10-CM | POA: Diagnosis not present

## 2023-07-03 DIAGNOSIS — E1169 Type 2 diabetes mellitus with other specified complication: Secondary | ICD-10-CM | POA: Diagnosis not present

## 2023-07-03 DIAGNOSIS — I10 Essential (primary) hypertension: Secondary | ICD-10-CM | POA: Diagnosis not present

## 2023-07-03 DIAGNOSIS — E782 Mixed hyperlipidemia: Secondary | ICD-10-CM | POA: Diagnosis not present

## 2023-07-04 ENCOUNTER — Encounter: Payer: Federal, State, Local not specified - PPO | Admitting: Obstetrics and Gynecology

## 2023-07-04 ENCOUNTER — Ambulatory Visit: Payer: Medicare Other

## 2023-07-10 DIAGNOSIS — M3500 Sicca syndrome, unspecified: Secondary | ICD-10-CM | POA: Diagnosis not present

## 2023-07-10 DIAGNOSIS — M0609 Rheumatoid arthritis without rheumatoid factor, multiple sites: Secondary | ICD-10-CM | POA: Diagnosis not present

## 2023-07-10 DIAGNOSIS — M3501 Sicca syndrome with keratoconjunctivitis: Secondary | ICD-10-CM | POA: Diagnosis not present

## 2023-07-10 DIAGNOSIS — E1169 Type 2 diabetes mellitus with other specified complication: Secondary | ICD-10-CM | POA: Diagnosis not present

## 2023-07-11 ENCOUNTER — Ambulatory Visit: Payer: Medicare Other

## 2023-07-16 DIAGNOSIS — M3500 Sicca syndrome, unspecified: Secondary | ICD-10-CM | POA: Diagnosis not present

## 2023-07-16 DIAGNOSIS — Z79899 Other long term (current) drug therapy: Secondary | ICD-10-CM | POA: Diagnosis not present

## 2023-07-16 DIAGNOSIS — M79643 Pain in unspecified hand: Secondary | ICD-10-CM | POA: Diagnosis not present

## 2023-07-16 DIAGNOSIS — L659 Nonscarring hair loss, unspecified: Secondary | ICD-10-CM | POA: Diagnosis not present

## 2023-07-18 ENCOUNTER — Ambulatory Visit: Payer: Medicare Other

## 2023-07-25 ENCOUNTER — Ambulatory Visit: Payer: Medicare Other

## 2023-08-06 DIAGNOSIS — M48062 Spinal stenosis, lumbar region with neurogenic claudication: Secondary | ICD-10-CM | POA: Diagnosis not present

## 2023-08-07 DIAGNOSIS — M0609 Rheumatoid arthritis without rheumatoid factor, multiple sites: Secondary | ICD-10-CM | POA: Diagnosis not present

## 2023-09-11 DIAGNOSIS — M0609 Rheumatoid arthritis without rheumatoid factor, multiple sites: Secondary | ICD-10-CM | POA: Diagnosis not present

## 2023-09-18 DIAGNOSIS — M5416 Radiculopathy, lumbar region: Secondary | ICD-10-CM | POA: Diagnosis not present

## 2023-09-18 DIAGNOSIS — M48061 Spinal stenosis, lumbar region without neurogenic claudication: Secondary | ICD-10-CM | POA: Diagnosis not present

## 2023-09-18 DIAGNOSIS — Z7409 Other reduced mobility: Secondary | ICD-10-CM | POA: Diagnosis not present

## 2023-09-24 ENCOUNTER — Other Ambulatory Visit: Payer: Self-pay | Admitting: Internal Medicine

## 2023-09-24 DIAGNOSIS — Z1231 Encounter for screening mammogram for malignant neoplasm of breast: Secondary | ICD-10-CM

## 2023-09-26 ENCOUNTER — Encounter (HOSPITAL_COMMUNITY): Payer: Self-pay

## 2023-10-03 ENCOUNTER — Ambulatory Visit

## 2023-10-09 DIAGNOSIS — M0609 Rheumatoid arthritis without rheumatoid factor, multiple sites: Secondary | ICD-10-CM | POA: Diagnosis not present

## 2023-10-16 DIAGNOSIS — Z79899 Other long term (current) drug therapy: Secondary | ICD-10-CM | POA: Diagnosis not present

## 2023-10-16 DIAGNOSIS — M79643 Pain in unspecified hand: Secondary | ICD-10-CM | POA: Diagnosis not present

## 2023-10-16 DIAGNOSIS — M3500 Sicca syndrome, unspecified: Secondary | ICD-10-CM | POA: Diagnosis not present

## 2023-10-16 DIAGNOSIS — L659 Nonscarring hair loss, unspecified: Secondary | ICD-10-CM | POA: Diagnosis not present

## 2023-10-17 DIAGNOSIS — L700 Acne vulgaris: Secondary | ICD-10-CM | POA: Diagnosis not present

## 2023-10-19 DIAGNOSIS — E119 Type 2 diabetes mellitus without complications: Secondary | ICD-10-CM | POA: Diagnosis not present

## 2023-10-19 DIAGNOSIS — I10 Essential (primary) hypertension: Secondary | ICD-10-CM | POA: Diagnosis not present

## 2023-10-19 DIAGNOSIS — Z7984 Long term (current) use of oral hypoglycemic drugs: Secondary | ICD-10-CM | POA: Diagnosis not present

## 2023-10-19 DIAGNOSIS — F32A Depression, unspecified: Secondary | ICD-10-CM | POA: Diagnosis not present

## 2023-10-24 ENCOUNTER — Ambulatory Visit
Admission: RE | Admit: 2023-10-24 | Discharge: 2023-10-24 | Disposition: A | Discharge status: Home / Self Care | Source: Ambulatory Visit | Attending: Internal Medicine | Admitting: Internal Medicine

## 2023-10-24 DIAGNOSIS — Z1231 Encounter for screening mammogram for malignant neoplasm of breast: Secondary | ICD-10-CM

## 2023-10-29 DIAGNOSIS — L659 Nonscarring hair loss, unspecified: Secondary | ICD-10-CM | POA: Diagnosis not present

## 2023-10-29 DIAGNOSIS — H02423 Myogenic ptosis of bilateral eyelids: Secondary | ICD-10-CM | POA: Diagnosis not present

## 2023-10-29 DIAGNOSIS — H04123 Dry eye syndrome of bilateral lacrimal glands: Secondary | ICD-10-CM | POA: Diagnosis not present

## 2023-10-29 DIAGNOSIS — M3500 Sicca syndrome, unspecified: Secondary | ICD-10-CM | POA: Diagnosis not present

## 2023-10-30 ENCOUNTER — Encounter: Payer: Self-pay | Admitting: Dermatology

## 2023-10-30 ENCOUNTER — Ambulatory Visit: Payer: Federal, State, Local not specified - PPO | Admitting: Dermatology

## 2023-10-30 VITALS — BP 147/91

## 2023-10-30 DIAGNOSIS — L659 Nonscarring hair loss, unspecified: Secondary | ICD-10-CM

## 2023-10-30 DIAGNOSIS — L708 Other acne: Secondary | ICD-10-CM | POA: Diagnosis not present

## 2023-10-30 DIAGNOSIS — L669 Cicatricial alopecia, unspecified: Secondary | ICD-10-CM | POA: Diagnosis not present

## 2023-10-30 MED ORDER — TRETINOIN 0.05 % EX CREA: TOPICAL_CREAM | Freq: Every day | CUTANEOUS | 4 refills | Status: AC

## 2023-10-30 NOTE — Progress Notes (Signed)
   New Patient Visit   Subjective  Rebecca Hahn is a 68 y.o. female who presents for the following: Acne Vulgaris - She is treating with Clindamycin lotion every morning and tretinoin 0.025% every evening. She started these in 2023 and in 2024 she was clear. It started flaring again a couple of months ago. She was prescribed the medications through telehealth. She started Cimzia  for RA towards the end of 2024. She was diagnosed with scarring alopecia in [place], [place] . She has Sjogren's Syndrome.    The following portions of the chart were reviewed this encounter and updated as appropriate: medications, allergies, medical history  Review of Systems:  No other skin or systemic complaints except as noted in HPI or Assessment and Plan.  Objective  Well appearing patient in no apparent distress; mood and affect are within normal limits.  Areas Examined: Face, chest and back  Relevant exam findings are noted in the Assessment and Plan.               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  Current treatment with clindamycin and tretinoin 0.025% is not as effective as before. The eruption appears to be related to clogged pores rather than bacterial infection.  - Plan:    Discontinue clindamycin    Increase tretinoin strength from 0.025% to 0.05%    Modify tretinoin application:     - Reduce frequency to 3 nights per week (Monday, Wednesday, Friday)     - Apply pea-sized amount     - Use hyaluronic acid before tretinoin application     - Follow with moisturizer (Avene Cicalfate recommended)    On non-tretinoin nights:     - Wash face     - Apply hyaluronic acid     - Apply Cicalfate balm    Use Aveeno for face washing    Add glycolic acid toner from [employer] on Saturdays after face washing,  followed by hyaluronic acid    Follow up in 3 months  2. Scarring Alopecia - Assessment:  Patient reports a history of scarring alopecia, confirmed by biopsy at [facility] hospital in [place], [place] . No treatment was initiated as it was attributed to hairstyle. Given the patient's history of Sjogren's syndrome, there is a possibility of autoimmune-related alopecia. Differential diagnoses include frontal fibrosing alopecia (which can cause eyebrow loss) and Graham-Little-Picardy syndrome (which includes frontal fibrosis, alopecia, and severe keratosis pilaris).  - Plan:    Continue monitoring for progression of hair loss    No specific treatment plan discussed during this visit     Return in about 3 months (around 01/30/2024) for Follow up.  I, Eliot Guernsey, CMA, am acting as scribe for [employer], DO .   Documentation: I have reviewed the above documentation for accuracy and completeness, and I agree with the above.  Rebecca Roup, DO

## 2023-10-30 NOTE — Patient Instructions (Addendum)
 Date: Tue Oct 30 2023  Hello Jodeen Munch,  Thank you for visiting today. Here is a summary of the key instructions:  - Medications:   - Increase tretinoin strength from 0.25% to 0.05%   - Use tretinoin 3 nights a week (Monday, Wednesday, Friday)   - Stop using clindamycin  - Skincare Routine:   - On tretinoin nights:     - Wash face     - Apply hyaluronic acid (Vichy brand recommended)     - Apply pea-sized amount of tretinoin     - Apply moisturizer (Avene Cicalfate recommended)   - On non-tretinoin nights:     - Wash face     - Apply hyaluronic acid     - Apply Cicalfate balm   - On Saturdays:     - Wash face     - Apply glycolic acid toner ([employer] brand recommended)     - Apply hyaluronic acid  - Products to Purchase:   - Vichy hyaluronic acid (available at [employer] or [employer])   - Avene Cicalfate moisturizer   - [employer] glycolic acid toner (available at [facility])  - Follow-up: Return for follow-up appointment in 3 months  We look forward to seeing you at your next visit. If you have any questions or concerns before then, please do not hesitate to contact our office.  Warm regards,  Dr. Louana Roup Dermatology                Topical retinoid medications like tretinoin/Retin-A, adapalene/Differin, tazarotene/Fabior, and Epiduo/Epiduo Forte can cause dryness and irritation when first started. Only apply a pea-sized amount to the entire affected area. Avoid applying it around the eyes, edges of mouth and creases at the nose. If you experience irritation, use a good moisturizer first and/or apply the medicine less often. If you are doing well with the medicine, you can increase how often you use it until you are applying every night. Be careful with sun protection while using this medication as it can make you sensitive to the sun. This medicine should not be used by pregnant women.      Important Information  Due to recent changes in healthcare  laws, you may see results of your pathology and/or laboratory studies on MyChart before the doctors have had a chance to review them. We understand that in some cases there may be results that are confusing or concerning to you. Please understand that not all results are received at the same time and often the doctors may need to interpret multiple results in order to provide you with the best plan of care or course of treatment. Therefore, we ask that you please give us  2 business days to thoroughly review all your results before contacting the office for clarification. Should we see a critical lab result, you will be contacted sooner.   If You Need Anything After Your Visit  If you have any questions or concerns for your doctor, please call our main line at [phone] If no one answers, please leave a voicemail as directed and we will return your call as soon as possible. Messages left after 4 pm will be answered the following business day.   You may also send us  a message via MyChart. We typically respond to MyChart messages within 1-2 business days.  For prescription refills, please ask your pharmacy to contact our office. Our fax number is [phone].  If you have an urgent issue when the clinic is  closed that cannot wait until the next business day, you can page your doctor at the number below.    Please note that while we do our best to be available for urgent issues outside of office hours, we are not available 24/7.   If you have an urgent issue and are unable to reach us , you may choose to seek medical care at your doctor's office, retail clinic, urgent care center, or emergency room.  If you have a medical emergency, please immediately call 911 or go to the emergency department. In the event of inclement weather, please call our main line at [phone] for an update on the status of any delays or closures.  Dermatology Medication Tips: Please keep the boxes that topical  medications come in in order to help keep track of the instructions about where and how to use these. Pharmacies typically print the medication instructions only on the boxes and not directly on the medication tubes.   If your medication is too expensive, please contact our office at [phone] or send us  a message through MyChart.   We are unable to tell what your co-pay for medications will be in advance as this is different depending on your insurance coverage. However, we may be able to find a substitute medication at lower cost or fill out paperwork to get insurance to cover a needed medication.   If a prior authorization is required to get your medication covered by your insurance company, please allow us  1-2 business days to complete this process.  Drug prices often vary depending on where the prescription is filled and some pharmacies may offer cheaper prices.  The website www.goodrx.com contains coupons for medications through different pharmacies. The prices here do not account for what the cost may be with help from insurance (it may be cheaper with your insurance), but the website can give you the price if you did not use any insurance.  - You can print the associated coupon and take it with your prescription to the pharmacy.  - You may also stop by our office during regular business hours and pick up a GoodRx coupon card.  - If you need your prescription sent electronically to a different pharmacy, notify our office through [facility] or by phone at [phone]

## 2023-10-31 ENCOUNTER — Other Ambulatory Visit: Payer: Self-pay | Admitting: Internal Medicine

## 2023-10-31 DIAGNOSIS — R928 Other abnormal and inconclusive findings on diagnostic imaging of breast: Secondary | ICD-10-CM

## 2023-11-06 DIAGNOSIS — M0609 Rheumatoid arthritis without rheumatoid factor, multiple sites: Secondary | ICD-10-CM | POA: Diagnosis not present

## 2023-11-21 ENCOUNTER — Ambulatory Visit

## 2023-11-21 ENCOUNTER — Ambulatory Visit
Admission: RE | Admit: 2023-11-21 | Discharge: 2023-11-21 | Disposition: A | Discharge status: Home / Self Care | Source: Ambulatory Visit | Attending: Internal Medicine | Admitting: Internal Medicine

## 2023-11-21 DIAGNOSIS — R928 Other abnormal and inconclusive findings on diagnostic imaging of breast: Secondary | ICD-10-CM

## 2023-11-21 DIAGNOSIS — R921 Mammographic calcification found on diagnostic imaging of breast: Secondary | ICD-10-CM | POA: Diagnosis not present

## 2023-11-26 ENCOUNTER — Other Ambulatory Visit: Payer: Self-pay | Admitting: Internal Medicine

## 2023-11-26 DIAGNOSIS — R921 Mammographic calcification found on diagnostic imaging of breast: Secondary | ICD-10-CM

## 2023-11-29 DIAGNOSIS — E1169 Type 2 diabetes mellitus with other specified complication: Secondary | ICD-10-CM | POA: Diagnosis not present

## 2023-11-29 DIAGNOSIS — M3501 Sicca syndrome with keratoconjunctivitis: Secondary | ICD-10-CM | POA: Diagnosis not present

## 2023-11-29 DIAGNOSIS — M3500 Sicca syndrome, unspecified: Secondary | ICD-10-CM | POA: Diagnosis not present

## 2023-12-12 DIAGNOSIS — M0609 Rheumatoid arthritis without rheumatoid factor, multiple sites: Secondary | ICD-10-CM | POA: Diagnosis not present

## 2023-12-25 DIAGNOSIS — E1169 Type 2 diabetes mellitus with other specified complication: Secondary | ICD-10-CM | POA: Diagnosis not present

## 2023-12-25 DIAGNOSIS — I10 Essential (primary) hypertension: Secondary | ICD-10-CM | POA: Diagnosis not present

## 2023-12-25 DIAGNOSIS — M3501 Sicca syndrome with keratoconjunctivitis: Secondary | ICD-10-CM | POA: Diagnosis not present

## 2023-12-25 DIAGNOSIS — E782 Mixed hyperlipidemia: Secondary | ICD-10-CM | POA: Diagnosis not present

## 2023-12-25 DIAGNOSIS — N182 Chronic kidney disease, stage 2 (mild): Secondary | ICD-10-CM | POA: Diagnosis not present

## 2023-12-26 ENCOUNTER — Ambulatory Visit: Payer: Federal, State, Local not specified - PPO | Admitting: Dermatology

## 2024-01-01 DIAGNOSIS — M3501 Sicca syndrome with keratoconjunctivitis: Secondary | ICD-10-CM | POA: Diagnosis not present

## 2024-01-01 DIAGNOSIS — Z0000 Encounter for general adult medical examination without abnormal findings: Secondary | ICD-10-CM | POA: Diagnosis not present

## 2024-01-01 DIAGNOSIS — E1169 Type 2 diabetes mellitus with other specified complication: Secondary | ICD-10-CM | POA: Diagnosis not present

## 2024-01-17 DIAGNOSIS — M0609 Rheumatoid arthritis without rheumatoid factor, multiple sites: Secondary | ICD-10-CM | POA: Diagnosis not present

## 2024-01-22 DIAGNOSIS — Z79899 Other long term (current) drug therapy: Secondary | ICD-10-CM | POA: Diagnosis not present

## 2024-01-22 DIAGNOSIS — M79643 Pain in unspecified hand: Secondary | ICD-10-CM | POA: Diagnosis not present

## 2024-01-22 DIAGNOSIS — M3500 Sicca syndrome, unspecified: Secondary | ICD-10-CM | POA: Diagnosis not present

## 2024-01-22 DIAGNOSIS — L659 Nonscarring hair loss, unspecified: Secondary | ICD-10-CM | POA: Diagnosis not present

## 2024-01-22 DIAGNOSIS — R768 Other specified abnormal immunological findings in serum: Secondary | ICD-10-CM | POA: Diagnosis not present

## 2024-01-29 DIAGNOSIS — Z23 Encounter for immunization: Secondary | ICD-10-CM | POA: Diagnosis not present

## 2024-01-30 ENCOUNTER — Ambulatory Visit (INDEPENDENT_AMBULATORY_CARE_PROVIDER_SITE_OTHER): Admitting: Dermatology

## 2024-01-30 VITALS — BP 146/84

## 2024-01-30 DIAGNOSIS — L708 Other acne: Secondary | ICD-10-CM

## 2024-01-30 MED ORDER — DOXYCYCLINE HYCLATE 100 MG PO TABS: 100.0000 mg | ORAL_TABLET | Freq: Every day | ORAL | 2 refills | Status: AC

## 2024-01-30 NOTE — Progress Notes (Signed)
   Follow-Up Visit   Subjective  Rebecca Hahn is a 68 y.o. female established patient who presents for FOLLOW UP on the diagnoses listed below:  Patient was last evaluated on 10/30/23.    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 Recommended hyaluronic acid before tretinoin  application followed by Avene Cicalfate balm and to use glycolic acid toner on weekends. Patient reports sxs are better as she expressed that her overall tone has improved and looks healthier. She notes that the bumps go away after applying the tretinoin  but start reappearing again throughout the course of the next morning.    The following portions of the chart were reviewed this encounter and updated as appropriate: medications, allergies, medical history  Review of Systems:  No other skin or systemic complaints except as noted in HPI or Assessment and Plan.  Objective  Well appearing patient in no apparent distress; mood and affect are within normal limits.   A focused examination was performed of the following areas: face   Relevant exam findings are noted in the Assessment and Plan.          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 Previous treatment with doxycycline  in 2024 provided temporary relief, but symptoms returned. Current topical tretinoin  treatment has softened the bumps but has not fully resolved the issue. Many of the bumps appear to be whiteheads and milia, requiring manual extraction.  - Plan:    Restart doxycycline  100 mg daily with dinner    Increase tretinoin  application to 5 nights per week, as tolerated    Continue using Avene Cicalfate moisturizer after tretinoin  application    Provide samples of  LaRoche-Posay adapalene for morning use    Instruct patient on self-extraction technique using Q-tips    Recommend alternating nights between tretinoin  application and self-extraction, followed by Cicalfate moisturizer    Consider in-office cosmetic extractions if needed   Return in about 3 months (around 04/30/2024) for  Acneiform Eruption.   Documentation: I have reviewed the above documentation for accuracy and completeness, and I agree with the above.  I, Shirron Maranda, CMA, am acting as scribe for [employer], DO.   Delon Lenis, DO

## 2024-01-30 NOTE — Patient Instructions (Addendum)
 Date: Wed Jan 30 2024  Hello Jacilyn,  Thank you for visiting today. Here is a summary of the key instructions:  - Medications:   - Take doxycycline  100 mg once a day with dinner   - Use tretinoin  cream 5 nights a week (keep doing every other night, while doing extractions every other night)   - Skip tretinoin  on weekends   - Apply Avene Cicalfate moisturizer after tretinoin  at night   - Try LaRoche-Posay adapalene in the morning  - Skin Care:   - Do extractions with Q-tips, not fingers   - Perform extractions on nights when not using tretinoin    - Apply Cicalfate moisturizer after extractions   - Alternate between tretinoin  nights and extraction nights  - Follow-up:   - Return in 3 months for check-up  - Other Instructions:   - If skin gets too dry, stop morning retinol first   - If still too dry, reduce tretinoin  to 3 nights a week   - Message through MyChart for any questions  Please reach out if you have any questions or concerns.  Warm regards,  Dr. Delon Lenis Dermatology     Cosmetic Removal Quote:  $200 to remove up to 15 lesions $300 to remove 16 to 25 lesions $400 to removal 26 to 35 lesions $10 per each additional lesion over 35  ** $100 deposit required to schedule **   Important Information  Due to recent changes in healthcare laws, you may see results of your pathology and/or laboratory studies on MyChart before the doctors have had a chance to review them. We understand that in some cases there may be results that are confusing or concerning to you. Please understand that not all results are received at the same time and often the doctors may need to interpret multiple results in order to provide you with the best plan of care or course of treatment. Therefore, we ask that you please give us  2 business days to thoroughly review all your results before contacting the office for clarification. Should we see a critical lab result, you will be  contacted sooner.   If You Need Anything After Your Visit  If you have any questions or concerns for your doctor, please call our main line at [phone] If no one answers, please leave a voicemail as directed and we will return your call as soon as possible. Messages left after 4 pm will be answered the following business day.   You may also send us  a message via MyChart. We typically respond to MyChart messages within 1-2 business days.  For prescription refills, please ask your pharmacy to contact our office. Our fax number is [phone].  If you have an urgent issue when the clinic is closed that cannot wait until the next business day, you can page your doctor at the number below.    Please note that while we do our best to be available for urgent issues outside of office hours, we are not available 24/7.   If you have an urgent issue and are unable to reach us , you may choose to seek medical care at your doctor's office, retail clinic, urgent care center, or emergency room.  If you have a medical emergency, please immediately call 911 or go to the emergency department. In the event of inclement weather, please call our main line at [phone] for an update on the status of any delays or closures.  Dermatology Medication Tips: Please keep  the boxes that topical medications come in in order to help keep track of the instructions about where and how to use these. Pharmacies typically print the medication instructions only on the boxes and not directly on the medication tubes.   If your medication is too expensive, please contact our office at [phone] or send us  a message through MyChart.   We are unable to tell what your co-pay for medications will be in advance as this is different depending on your insurance coverage. However, we may be able to find a substitute medication at lower cost or fill out paperwork to get insurance to cover a needed medication.   If a prior  authorization is required to get your medication covered by your insurance company, please allow us  1-2 business days to complete this process.  Drug prices often vary depending on where the prescription is filled and some pharmacies may offer cheaper prices.  The website www.goodrx.com contains coupons for medications through different pharmacies. The prices here do not account for what the cost may be with help from insurance (it may be cheaper with your insurance), but the website can give you the price if you did not use any insurance.  - You can print the associated coupon and take it with your prescription to the pharmacy.  - You may also stop by our office during regular business hours and pick up a GoodRx coupon card.  - If you need your prescription sent electronically to a different pharmacy, notify our office through [facility] or by phone at [phone]

## 2024-02-14 DIAGNOSIS — M0609 Rheumatoid arthritis without rheumatoid factor, multiple sites: Secondary | ICD-10-CM | POA: Diagnosis not present

## 2024-02-28 DIAGNOSIS — Z23 Encounter for immunization: Secondary | ICD-10-CM | POA: Diagnosis not present

## 2024-03-18 DIAGNOSIS — M0609 Rheumatoid arthritis without rheumatoid factor, multiple sites: Secondary | ICD-10-CM | POA: Diagnosis not present

## 2024-04-15 DIAGNOSIS — M0609 Rheumatoid arthritis without rheumatoid factor, multiple sites: Secondary | ICD-10-CM | POA: Diagnosis not present

## 2024-04-23 DIAGNOSIS — M79643 Pain in unspecified hand: Secondary | ICD-10-CM | POA: Diagnosis not present

## 2024-04-23 DIAGNOSIS — L659 Nonscarring hair loss, unspecified: Secondary | ICD-10-CM | POA: Diagnosis not present

## 2024-04-23 DIAGNOSIS — Z79899 Other long term (current) drug therapy: Secondary | ICD-10-CM | POA: Diagnosis not present

## 2024-04-23 DIAGNOSIS — M3500 Sicca syndrome, unspecified: Secondary | ICD-10-CM | POA: Diagnosis not present

## 2024-05-06 ENCOUNTER — Encounter: Payer: Self-pay | Admitting: Dermatology

## 2024-05-06 ENCOUNTER — Ambulatory Visit: Admitting: Dermatology

## 2024-05-06 VITALS — BP 160/94

## 2024-05-06 DIAGNOSIS — R238 Other skin changes: Secondary | ICD-10-CM

## 2024-05-06 DIAGNOSIS — L708 Other acne: Secondary | ICD-10-CM

## 2024-05-06 DIAGNOSIS — L700 Acne vulgaris: Secondary | ICD-10-CM | POA: Diagnosis not present

## 2024-05-06 DIAGNOSIS — L810 Postinflammatory hyperpigmentation: Secondary | ICD-10-CM | POA: Diagnosis not present

## 2024-05-06 MED ORDER — DOXYCYCLINE HYCLATE 50 MG PO CAPS: 50.0000 mg | ORAL_CAPSULE | Freq: Every day | ORAL | 11 refills | Status: AC

## 2024-05-06 MED ORDER — SAFETY SEAL MISCELLANEOUS MISC: 11 refills | Status: AC

## 2024-05-06 NOTE — Progress Notes (Signed)
° °  Follow-Up Visit   Subjective  Rebecca Hahn is a 68 y.o. female established patient who presents for FOLLOW UP on the diagnoses listed below:  Patient was last evaluated on 01/30/24.   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 She has continued applying LRP Adapalene QAM & Tretinoin  0.05% that she has been able to tolerate 3 nights a week. She is also Applying Avene cicalfate balm on nights that she is using retinoid. Pt mentioned that she also has been using an Aveeno facial cleanser & tumeric bar daily followed by Aveeno moisturizer. Pt stated she has had one painful breakout acne bump 1 week ago that she squeeze puss out of.    The following portions of the chart were reviewed this encounter and updated as appropriate: medications, allergies, medical history  Review of Systems:  No other skin or systemic complaints except as noted in HPI or Assessment and Plan.  Objective  Well appearing patient in no apparent distress; mood and affect are within normal limits.   A focused examination was performed of the following areas: face   Relevant exam findings are noted in the Assessment and Plan.             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 Extraction of comedones is ongoing, with some lesions containing pus. Current regimen is well-tolerated without excessive dryness. - Continue adapalene in the morning and tretinoin  at night. - Reduce doxycycline  to 50 mg daily. Increase to 100 mg daily for one week if a large lesion develops. - Increase tretinoin  application to five nights a week in April or May, with adequate moisturization to prevent dryness.  Postinflammatory hyperpigmentation Present. She is acclimated to tretinoin , allowing for the addition of a treatment for dark  spots. - Prescribed [employer] compounded tranexamic acid cream to be applied morning and night, followed by adapalene and tretinoin , and then moisturizer. - Expect lightening effect in three to four months.  Facial wrinkles Present, with tretinoin  being used on the cheeks. Tretinoin  is effective for anti-wrinkle treatment by stimulating collagen production. She prefers not to use Botox. - Apply tretinoin  all over the face, not just on the cheeks, to slow down wrinkle formation.   No follow-ups on file.   Documentation: I have reviewed the above documentation for accuracy and completeness, and I agree with the above.  I, Shirron Maranda, CMA II, am acting as scribe for:  Delon Lenis, DO

## 2024-05-06 NOTE — Patient Instructions (Addendum)
 VISIT SUMMARY:  Today, we discussed your current skincare regimen and made some adjustments to better manage your acne, dark spots, and facial wrinkles. Your skin is responding well to the treatments, and we have introduced a new cream to help with dark spots.  YOUR PLAN:  -ACNE VULGARIS: ' Acne vulgaris is a common skin condition that occurs when hair follicles become clogged with oil and dead skin cells.   Continue using adapalene in the morning and tretinoin  at night (3 nights a week).   Reduce doxycycline  to 50 mg daily, but increase to 100 mg daily for one week if a large lesion develops.   In April or May, increase tretinoin  application to five nights a week, ensuring you moisturize adequately to prevent dryness.  -POSTINFLAMMATORY HYPERPIGMENTATION:  Postinflammatory hyperpigmentation is the darkening of the skin that can occur after an inflammatory wound.   You will start using a compounded tranexamic acid cream in the morning and at night, followed by adapalene and tretinoin , and then your moisturizer.   You should see a lightening effect in three to four months.  -FACIAL WRINKLES:  Facial wrinkles are lines that form on the skin due to aging and other factors. Tretinoin  helps reduce wrinkles by stimulating collagen production. Apply tretinoin  all over your face, not just on your cheeks, to help slow down wrinkle formation.  INSTRUCTIONS:  Please follow up in three months to assess the progress of your treatments and make any necessary adjustments.     Important Information  Due to recent changes in healthcare laws, you may see results of your pathology and/or laboratory studies on MyChart before the doctors have had a chance to review them. We understand that in some cases there may be results that are confusing or concerning to you. Please understand that not all results are received at the same time and often the doctors may need to interpret multiple results in order  to provide you with the best plan of care or course of treatment. Therefore, we ask that you please give us  2 business days to thoroughly review all your results before contacting the office for clarification. Should we see a critical lab result, you will be contacted sooner.   If You Need Anything After Your Visit  If you have any questions or concerns for your doctor, please call our main line at [phone] If no one answers, please leave a voicemail as directed and we will return your call as soon as possible. Messages left after 4 pm will be answered the following business day.   You may also send us  a message via MyChart. We typically respond to MyChart messages within 1-2 business days.  For prescription refills, please ask your pharmacy to contact our office. Our fax number is [phone].  If you have an urgent issue when the clinic is closed that cannot wait until the next business day, you can page your doctor at the number below.    Please note that while we do our best to be available for urgent issues outside of office hours, we are not available 24/7.   If you have an urgent issue and are unable to reach us , you may choose to seek medical care at your doctor's office, retail clinic, urgent care center, or emergency room.  If you have a medical emergency, please immediately call 911 or go to the emergency department. In the event of inclement weather, please call our main line at [phone] for an update on the status  of any delays or closures.  Dermatology Medication Tips: Please keep the boxes that topical medications come in in order to help keep track of the instructions about where and how to use these. Pharmacies typically print the medication instructions only on the boxes and not directly on the medication tubes.   If your medication is too expensive, please contact our office at [phone] or send us  a message through MyChart.   We are unable to tell what your  co-pay for medications will be in advance as this is different depending on your insurance coverage. However, we may be able to find a substitute medication at lower cost or fill out paperwork to get insurance to cover a needed medication.   If a prior authorization is required to get your medication covered by your insurance company, please allow us  1-2 business days to complete this process.  Drug prices often vary depending on where the prescription is filled and some pharmacies may offer cheaper prices.  The website www.goodrx.com contains coupons for medications through different pharmacies. The prices here do not account for what the cost may be with help from insurance (it may be cheaper with your insurance), but the website can give you the price if you did not use any insurance.  - You can print the associated coupon and take it with your prescription to the pharmacy.  - You may also stop by our office during regular business hours and pick up a GoodRx coupon card.  - If you need your prescription sent electronically to a different pharmacy, notify our office through [facility] or by phone at [phone]

## 2024-05-28 ENCOUNTER — Ambulatory Visit: Admitting: Internal Medicine

## 2024-05-28 ENCOUNTER — Encounter: Payer: Self-pay | Admitting: Internal Medicine

## 2024-05-28 VITALS — BP 140/84 | HR 82 | Ht 67.0 in | Wt 232.0 lb

## 2024-05-28 DIAGNOSIS — D649 Anemia, unspecified: Secondary | ICD-10-CM

## 2024-05-28 DIAGNOSIS — Z1211 Encounter for screening for malignant neoplasm of colon: Secondary | ICD-10-CM | POA: Diagnosis not present

## 2024-05-28 DIAGNOSIS — R197 Diarrhea, unspecified: Secondary | ICD-10-CM | POA: Diagnosis not present

## 2024-05-28 MED ORDER — NA SULFATE-K SULFATE-MG SULF 17.5-3.13-1.6 GM/177ML PO SOLN: 1.0000 | Freq: Once | ORAL | 0 refills | Status: AC

## 2024-05-28 NOTE — Addendum Note (Signed)
 Addended by: WILLIEMAE JOLA PARAS on: 05/28/2024 05:00 PM   Modules accepted: Orders

## 2024-05-28 NOTE — Progress Notes (Signed)
 "  Chief Complaint: Colon cancer screening  HPI : 69 year old female with history of headaches, DM, Sjogren's syndrome, gout, osteoarthritis, OSA, and obesity presents to discuss colon cancer screening  Last colonoscopy in 2015 was unremarkable with negative biopsies for microscopic colitis.  [facility] polyp but is unsure of how large this polyp was.  She does have family history of colon cancer in an uncle. Denies overt rectal bleeding. She does have intermittent diarrhea that occurs after eating certain types of foods. She sees some red discoloration on occasion. She does not consider these symptoms severe or concerning. Denies persistent changes in bowel habits. Denies constipation. Denies ab pain. She does have chronic throat congestion and frequent coughing to clear secretions. She has some discomfort in her throat but attributes this to dryness. Denies dysphagia to foods. No nausea or vomiting.   Past Medical History:  Diagnosis Date   Acne vulgaris    Agenesis of gallbladder    Alopecia    Anemia    Anxiety    Cognitive dysfunction    Colon polyp    DDD (degenerative disc disease), lumbar    Depression    Diabetes mellitus, type II (HCC)    Gastric polyp    GERD (gastroesophageal reflux disease)    Glaucoma    Gout    Headache    migraines   Hearing loss    Hyperlipidemia    Hypertension    Lumbar radiculopathy    Lumbar radiculopathy    Morbid obesity (HCC)    Osteoarthritis    Sjoegren syndrome    Sleep apnea    CPAP   Vertigo      Past Surgical History:  Procedure Laterality Date   BACK SURGERY     CESAREAN SECTION     CHOLECYSTECTOMY     COLONOSCOPY  2015   TUBAL LIGATION     Family History  Problem Relation Age of Onset   Depression Maternal Aunt    Gallstones Mother    Colon cancer Maternal Uncle    Lupus Cousin    Cancer Cousin    Social History[1] Current Outpatient Medications  Medication  Sig Dispense Refill   acetaminophen  (TYLENOL ) 500 MG tablet Take 1,000 mg by mouth in the morning and at bedtime.     albuterol  (PROVENTIL  HFA;VENTOLIN  HFA) 108 (90 Base) MCG/ACT inhaler Inhale 1-2 puffs into the lungs every 6 (six) hours as needed for wheezing or shortness of breath. 1 Inhaler 0   allopurinol  (ZYLOPRIM ) 100 MG tablet Take 200 mg by mouth daily.      aspirin  81 MG EC tablet Take 1 tablet (81 mg total) by mouth daily. 30 tablet 12   Carboxymethylcellulose Sodium 1 % GEL Place 1 drop into both eyes 4 (four) times daily as needed (dry eyes).     Certolizumab Pegol  (CIMZIA  [place]) Inject into the skin every 30 (thirty) days.     Cholecalciferol 50 MCG (2000 UT) TABS Take by mouth.     clindamycin (CLEOCIN T) 1 % lotion Apply topically daily as needed.     clonazePAM  (KLONOPIN ) 0.5 MG tablet Take 1 tablet (0.5 mg total) by mouth 2 (two) times daily. (Patient taking differently: Take 1 mg by mouth every morning.) 180 tablet 1   doxycycline  (VIBRAMYCIN ) 50 MG capsule Take 1 capsule (50 mg total) by mouth daily. TAKE WITH DINNER. 30 capsule 11   fluticasone (FLONASE) 50 MCG/ACT nasal  spray Place 1 spray into both nostrils daily as needed for allergies or rhinitis.     gabapentin  (NEURONTIN ) 100 MG capsule Take 100-200 mg by mouth See admin instructions. 100 mg in the morning, 200 mg at bedtime     JARDIANCE 25 MG TABS tablet Take 25 mg by mouth daily.     loratadine (CLARITIN) 10 MG tablet Take 10 mg by mouth daily.     meclizine  (ANTIVERT ) 12.5 MG tablet Take 12.5-25 mg by mouth See admin instructions. 12.5 mg in the morning, 25 mg at bedtime     metFORMIN  (GLUCOPHAGE ) 500 MG tablet Take 500 mg by mouth daily with breakfast.     metoprolol  tartrate (LOPRESSOR ) 50 MG tablet TAKE 1 TABLET(50 MG) BY MOUTH TWICE DAILY 180 tablet 1   omeprazole (PRILOSEC) 20 MG capsule Take 20 mg by mouth daily.     promethazine  (PHENERGAN ) 25 MG tablet Take 25 mg by mouth 2 (two) times daily as needed for nausea  or vomiting.     rosuvastatin  (CRESTOR ) 20 MG tablet Take 20 mg by mouth every evening.     Safety Seal Miscellaneous MISC Melaxemic Cream tranexamic acid USP 7% kojic acid USP 2% Vit C USP 2.5% Hyaluronic Acid EXCP 0.1% apply to face BID morning and night. 15 g 11   sertraline  (ZOLOFT ) 100 MG tablet Take 1 tablet (100 mg total) by mouth at bedtime. (Patient taking differently: Take 200 mg by mouth at bedtime.) 90 tablet 1   SUMAtriptan  (IMITREX ) 100 MG tablet Take 100 mg by mouth every 2 (two) hours as needed for migraine. May repeat in 2 hours if headache persists or recurs.     topiramate  (TOPAMAX ) 25 MG tablet TAKE ONE-HALF TABLET BY MOUTH ONCE A DAY AND TAKE ONE AND ONE-HALF TABLETS AT BEDTIME FOR 4 WEEKS, THEN TAKE ONE-HALF TABLET ONCE A DAY AND TAKE TWO TABLETS AT BEDTIME TO REDUCE MIGRAINE FREQUENCY/SEVERITY     traZODone  (DESYREL ) 100 MG tablet Take 1 tablet (100 mg total) by mouth at bedtime. 90 tablet 1   tretinoin  (RETIN-A ) 0.05 % cream Apply topically at bedtime. Apply pea size amount to face at bedtime 3 nights per week 45 g 4   Multiple Vitamins-Minerals (CENTRUM SILVER 50+WOMEN) TABS Take 1 tablet by mouth daily. (Patient not taking: Reported on 05/28/2024)     No current facility-administered medications for this visit.   Allergies[2]   Review of Systems: All systems reviewed and negative except where noted in HPI.   Physical Exam: BP (!) 140/84   Pulse 82   Ht 5' 7 (1.702 m)   Wt 232 lb (105.2 kg)   BMI 36.34 kg/m  Constitutional: Pleasant,well-developed, female in no acute distress. HEENT: Normocephalic and atraumatic. Conjunctivae are normal. No scleral icterus. Cardiovascular: Normal rate, regular rhythm.  Pulmonary/chest: Effort normal and breath sounds normal. No wheezing, rales or rhonchi. Abdominal: Soft, nondistended, nontender. Bowel sounds active throughout. There are no masses palpable. No hepatomegaly. Extremities: No edema Neurological: Alert and oriented  to person place and time. Skin: Skin is warm and dry. No rashes noted. Psychiatric: Normal mood and affect. Behavior is normal.  Labs 02/2023: CBC with low Hb of 10.8. BMP with mildly low K of 3.4.  EGD 08/08/13:  Path: oxyntic mucosa with focal adenomatous change, chronic gastritis, and no h pylori   Colonoscopy 08/13/13:  Path: Biopsies negative for microscopic colitis  ASSESSMENT AND PLAN: Colon cancer screening Intermittent diarrhea Anemia Throat discomfort/hoarseness Last colonoscopy was in 2015 that showed  some diverticulosis and hemorrhoids but was otherwise unremarkable. She is due for another colonoscopy for colon cancer screening currently. Risks of colonoscopy, including bleeding, infection, gastrointestinal perforation, and sedation-related changes in vital signs, were discussed, along with anticipated benefits. Also noted that she has history of anemia as well as throat discomfort/hoarseness so will also plan for an EGD to evaluate for any signs of GERD, screen for Barrett's esophagus, and look for any sources of GI bleed/rule out malignancy. Her diarrhea seems to be food-related and is not excessively bothersome to her. - Check anemia labs during her procedure including repeat CBC as well as ferritin/IBC, vitamin B12, and folate during her upcomign procedure - Prefers for a EGD/colonoscopy [facility] to be performed in 07/2024 per patient preference   Estefana Kidney, MD  I spent 45 minutes of time, including in depth chart review, independent review of results as outlined above, communicating results with the patient directly, face-to-face time with the patient, coordinating care, ordering studies and medications as appropriate, and documentation.      [1]  Social History Tobacco Use   Smoking status: Former    Current packs/day: 0.00    Average packs/day: 1 pack/day for 10.0 years (10.0 ttl pk-yrs)    Types: Cigarettes    Start date: 78    Quit date: 1978    Years since  quitting: 48.0   Smokeless tobacco: Never   Tobacco comments:    in teen years  Vaping Use   Vaping status: Never Used  Substance Use Topics   Alcohol use: No   Drug use: No  [2]  Allergies Allergen Reactions   Oxycodone      Loopy, confusion, panic, paranoia    Penicillins Rash    Has patient had a PCN reaction causing immediate rash, facial/tongue/throat swelling, SOB or lightheadedness with hypotension: No Has patient had a PCN reaction causing severe rash involving mucus membranes or skin necrosis: No Has patient had a PCN reaction that required hospitalization: No Has patient had a PCN reaction occurring within the last 10 years: No If all of the above answers are NO, then may proceed with Cephalosporin use.     "

## 2024-05-28 NOTE — Patient Instructions (Signed)
 You have been scheduled for a colonoscopy. Please follow written instructions given to you at your visit today.   If you use inhalers (even only as needed), please bring them with you on the day of your procedure.  DO NOT TAKE 7 DAYS PRIOR TO TEST- Trulicity (dulaglutide) Ozempic, Wegovy (semaglutide) Mounjaro, Zepbound (tirzepatide) Bydureon Bcise (exanatide extended release)  DO NOT TAKE 1 DAY PRIOR TO YOUR TEST Rybelsus (semaglutide) Adlyxin (lixisenatide) Victoza (liraglutide) Byetta (exanatide) ___________________________________________________________________________   Thank you for entrusting me with your care and for choosing [facility], Dr. Estefana Kidney  _______________________________________________________  If your blood pressure at your visit was 140/90 or greater, please contact your primary care physician to follow up on this.  _______________________________________________________  If you are age 65 or older, your body mass index should be between 23-30. Your Body mass index is 36.34 kg/m. If this is out of the aforementioned range listed, please consider follow up with your Primary Care Provider.  If you are age 58 or younger, your body mass index should be between 19-25. Your Body mass index is 36.34 kg/m. If this is out of the aformentioned range listed, please consider follow up with your Primary Care Provider.   ________________________________________________________  The [facility] providers would like to encourage you to use MYCHART to communicate with providers for non-urgent requests or questions.  Due to long hold times on the telephone, sending your provider a message by [facility] may be a faster and more efficient way to get a response.  Please allow 48 business hours for a response.  Please remember that this is for non-urgent requests.  _______________________________________________________  [facility] is using a team-based  approach to care.  Your team is made up of your doctor and two to three APPS. Our APPS (Nurse Practitioners and Physician Assistants) work with your physician to ensure care continuity for you. They are fully qualified to address your health concerns and develop a treatment plan. They communicate directly with your gastroenterologist to care for you. Seeing the Advanced Practice Practitioners on your physician's team can help you by facilitating care more promptly, often allowing for earlier appointments, access to diagnostic testing, procedures, and other specialty referrals.

## 2024-06-03 ENCOUNTER — Other Ambulatory Visit: Payer: Self-pay | Admitting: Internal Medicine

## 2024-06-03 ENCOUNTER — Ambulatory Visit
Admission: RE | Admit: 2024-06-03 | Discharge: 2024-06-03 | Disposition: A | Source: Ambulatory Visit | Attending: Internal Medicine | Admitting: Internal Medicine

## 2024-06-03 DIAGNOSIS — R921 Mammographic calcification found on diagnostic imaging of breast: Secondary | ICD-10-CM

## 2024-06-03 DIAGNOSIS — R928 Other abnormal and inconclusive findings on diagnostic imaging of breast: Secondary | ICD-10-CM

## 2024-06-06 ENCOUNTER — Encounter: Payer: Self-pay | Admitting: *Deleted

## 2024-06-06 NOTE — Progress Notes (Signed)
 Rebecca Hahn                                          MRN: [id number]   06/06/2024   The [facility] Quality Team Specialist reviewed this patient medical record for the purposes of chart review for care gap closure. The following were reviewed: chart review for care gap closure-kidney health evaluation for diabetes:eGFR  and uACR.    [facility] Quality Team

## 2024-06-23 NOTE — Progress Notes (Signed)
 Rebecca Hahn                                          MRN: [id number]   06/23/2024   The [facility] Quality Team Specialist reviewed this patient medical record for the purposes of chart review for care gap closure. The following were reviewed: chart review for care gap closure-colorectal cancer screening and controlling blood pressure.    [facility] Quality Team

## 2024-07-21 ENCOUNTER — Encounter: Admitting: Internal Medicine

## 2024-07-22 ENCOUNTER — Encounter: Admitting: Internal Medicine

## 2024-12-02 ENCOUNTER — Encounter
